# Patient Record
Sex: Male | Born: 1940 | Hispanic: No | Marital: Married | Smoking: Never smoker
Health system: Southern US, Community
[De-identification: ages and names within clinical notes are randomized; demographics above are authoritative.]

## PROBLEM LIST (undated history)

## (undated) DIAGNOSIS — I10 Essential (primary) hypertension: Secondary | ICD-10-CM

## (undated) DIAGNOSIS — N189 Chronic kidney disease, unspecified: Secondary | ICD-10-CM

## (undated) DIAGNOSIS — J45909 Unspecified asthma, uncomplicated: Secondary | ICD-10-CM

## (undated) DIAGNOSIS — F419 Anxiety disorder, unspecified: Secondary | ICD-10-CM

## (undated) DIAGNOSIS — J189 Pneumonia, unspecified organism: Secondary | ICD-10-CM

## (undated) DIAGNOSIS — I499 Cardiac arrhythmia, unspecified: Secondary | ICD-10-CM

## (undated) DIAGNOSIS — C61 Malignant neoplasm of prostate: Secondary | ICD-10-CM

## (undated) DIAGNOSIS — F329 Major depressive disorder, single episode, unspecified: Secondary | ICD-10-CM

## (undated) DIAGNOSIS — M1990 Unspecified osteoarthritis, unspecified site: Secondary | ICD-10-CM

## (undated) DIAGNOSIS — Z923 Personal history of irradiation: Secondary | ICD-10-CM

## (undated) DIAGNOSIS — F32A Depression, unspecified: Secondary | ICD-10-CM

## (undated) DIAGNOSIS — G4730 Sleep apnea, unspecified: Secondary | ICD-10-CM

## (undated) HISTORY — DX: Major depressive disorder, single episode, unspecified: F32.9

## (undated) HISTORY — PX: CORNEAL TRANSPLANT: SHX108

## (undated) HISTORY — DX: Depression, unspecified: F32.A

## (undated) HISTORY — PX: CATARACT EXTRACTION: SUR2

## (undated) HISTORY — DX: Essential (primary) hypertension: I10

---

## 2010-05-30 ENCOUNTER — Ambulatory Visit
Admission: RE | Admit: 2010-05-30 | Discharge: 2010-05-30 | Payer: Self-pay | Source: Home / Self Care | Attending: Internal Medicine | Admitting: Internal Medicine

## 2010-05-30 DIAGNOSIS — F329 Major depressive disorder, single episode, unspecified: Secondary | ICD-10-CM | POA: Insufficient documentation

## 2010-05-30 DIAGNOSIS — I10 Essential (primary) hypertension: Secondary | ICD-10-CM | POA: Insufficient documentation

## 2010-05-30 LAB — CONVERTED CEMR LAB

## 2010-06-05 NOTE — Assessment & Plan Note (Signed)
Summary: NEW MEDICARE PT--#--PKG/OFF--STC   Vital Signs:  Patient profile:   70 year old male Height:      74 inches Weight:      245 pounds BMI:     31.57 O2 Sat:      97 % on Room air Temp:     98.1 degrees F oral Pulse rate:   74 / minute Pulse rhythm:   regular Resp:     16 per minute BP supine:   144 / 84  (left arm) BP sitting:   148 / 84  (right arm)  Nutrition Counseling: Patient's BMI is greater than 25 and therefore counseled on weight management options.  O2 Flow:  Room air  Primary Care Provider:  Etta Grandchild MD   History of Present Illness: New to me treatment of hypertension. He had a complete physical in 2011 by Dr. Dara Hoyer and he tells me that all was fine - no records are available today. He thinks Benicar is too expensive and wants a generic option.  Preventive Screening-Counseling & Management  Alcohol-Tobacco     Alcohol drinks/day: 2     Alcohol type: wine     >5/day in last 3 mos: no     Alcohol Counseling: not indicated; use of alcohol is not excessive or problematic     Feels need to cut down: no     Feels annoyed by complaints: no     Feels guilty re: drinking: no     Needs 'eye opener' in am: no     Smoking Status: never     Tobacco Counseling: not indicated; no tobacco use  Hep-HIV-STD-Contraception     Hepatitis Risk: no risk noted     HIV Risk: no risk noted     STD Risk: no risk noted  Safety-Violence-Falls     Seat Belt Use: yes     Helmet Use: n/a     Firearms in the Home: no firearms in the home     Smoke Detectors: no     Violence in the Home: no risk noted      Sexual History:  currently monogamous.        Drug Use:  never.        Blood Transfusions:  no.    Current Medications (verified): 1)  Losartan Potassium-Hctz 100-12.5 Mg Tabs (Losartan Potassium-Hctz) .... One By Mouth Once Daily For High Blood Pressure 2)  Metoprolol Tartrate 100 Mg Tabs (Metoprolol Tartrate) .... One By Mouth Two Times A Day For High  Blood Pressure 3)  Citalopram Hydrobromide 20 Mg Tabs (Citalopram Hydrobromide) .... One By Mouth Once Daily  Allergies (verified): No Known Drug Allergies  Past History:  Past Medical History: Depression Hypertension  Social History: Smoking Status:  never Hepatitis Risk:  no risk noted HIV Risk:  no risk noted STD Risk:  no risk noted Seat Belt Use:  yes Sexual History:  currently monogamous Drug Use:  never Blood Transfusions:  no  Review of Systems       The patient complains of weight gain.  The patient denies anorexia, fever, weight loss, chest pain, syncope, dyspnea on exertion, peripheral edema, prolonged cough, headaches, hemoptysis, abdominal pain, suspicious skin lesions, difficulty walking, depression, unusual weight change, abnormal bleeding, and enlarged lymph nodes.   Psych:  Denies anxiety, depression, easily angered, easily tearful, irritability, mental problems, panic attacks, sense of great danger, suicidal thoughts/plans, thoughts of violence, and unusual visions or sounds.  Physical Exam  General:  alert, well-developed, well-nourished, well-hydrated, appropriate dress, normal appearance, healthy-appearing, cooperative to examination, good hygiene, and overweight-appearing.   Head:  normocephalic, atraumatic, no abnormalities observed, and no abnormalities palpated.   Eyes:  vision grossly intact and pupils equal.   Mouth:  Oral mucosa and oropharynx without lesions or exudates.  Teeth in good repair. Neck:  supple, full ROM, no masses, no thyromegaly, no thyroid nodules or tenderness, no JVD, normal carotid upstroke, no carotid bruits, no cervical lymphadenopathy, and no neck tenderness.   Lungs:  normal respiratory effort, no intercostal retractions, no accessory muscle use, normal breath sounds, no dullness, no fremitus, no crackles, and no wheezes.   Heart:  normal rate, regular rhythm, no murmur, no gallop, no rub, and no JVD.   Abdomen:  soft,  non-tender, normal bowel sounds, no distention, no masses, no guarding, no rigidity, no rebound tenderness, no abdominal hernia, no inguinal hernia, no hepatomegaly, and no splenomegaly.   Msk:  No deformity or scoliosis noted of thoracic or lumbar spine.   Pulses:  R and L carotid,radial,femoral,dorsalis pedis and posterior tibial pulses are full and equal bilaterally Extremities:  trace left pedal edema and trace right pedal edema.   Neurologic:  No cranial nerve deficits noted. Station and gait are normal. Plantar reflexes are down-going bilaterally. DTRs are symmetrical throughout. Sensory, motor and coordinative functions appear intact. Skin:  turgor normal, color normal, no rashes, no suspicious lesions, no ecchymoses, no petechiae, no purpura, no ulcerations, and no edema.   Cervical Nodes:  no anterior cervical adenopathy and no posterior cervical adenopathy.   Axillary Nodes:  no R axillary adenopathy and no L axillary adenopathy.   Psych:  Oriented X3, memory intact for recent and remote, normally interactive, good eye contact, not anxious appearing, not depressed appearing, not agitated, and not suicidal.     Impression & Recommendations:  Problem # 1:  HYPERTENSION (ICD-401.9) Assessment Improved  His updated medication list for this problem includes:    Losartan Potassium-hctz 100-12.5 Mg Tabs (Losartan potassium-hctz) ..... One by mouth once daily for high blood pressure    Metoprolol Tartrate 100 Mg Tabs (Metoprolol tartrate) ..... One by mouth two times a day for high blood pressure  BP today: 148/84  Problem # 2:  DEPRESSION (ICD-311) Assessment: Improved  His updated medication list for this problem includes:    Citalopram Hydrobromide 20 Mg Tabs (Citalopram hydrobromide) ..... One by mouth once daily  Discussed treatment options, including trial of antidpressant medication. Will refer to behavioral health. Follow-up call in in 24-48 hours and recheck in 2 weeks, sooner  as needed. Patient agrees to call if any worsening of symptoms or thoughts of doing harm arise. Verified that the patient has no suicidal ideation at this time.   Complete Medication List: 1)  Losartan Potassium-hctz 100-12.5 Mg Tabs (Losartan potassium-hctz) .... One by mouth once daily for high blood pressure 2)  Metoprolol Tartrate 100 Mg Tabs (Metoprolol tartrate) .... One by mouth two times a day for high blood pressure 3)  Citalopram Hydrobromide 20 Mg Tabs (Citalopram hydrobromide) .... One by mouth once daily  Other Orders: Tdap => 15yrs IM (04540) Pneumococcal Vaccine (98119) Admin 1st Vaccine (14782) Admin of Any Addtl Vaccine (95621)  Colorectal Screening:  Current Recommendations:    Colonoscopy recommended: patient defers today but will consider in the future  PSA Screening:    Reviewed PSA screening recommendations: patient refuses PSA understanding risk of delayed diagnosis  Immunization & Chemoprophylaxis:    Tetanus vaccine: Tdap  (  05/30/2010)    Pneumovax: Pneumovax  (05/30/2010)  Patient Instructions: 1)  Please schedule a follow-up appointment in 4 months. 2)  It is important that you exercise regularly at least 20 minutes 5 times a week. If you develop chest pain, have severe difficulty breathing, or feel very tired , stop exercising immediately and seek medical attention. 3)  You need to lose weight. Consider a lower calorie diet and regular exercise.  4)  Schedule a colonoscopy/sigmoidoscopy to help detect colon cancer. 5)  Check your Blood Pressure regularly. If it is above 140/90: you should make an appointment. Prescriptions: CITALOPRAM HYDROBROMIDE 20 MG TABS (CITALOPRAM HYDROBROMIDE) One by mouth once daily  #90 x 3   Entered and Authorized by:   Etta Grandchild MD   Signed by:   Etta Grandchild MD on 05/30/2010   Method used:   Electronically to        [facility]. [phone]* (retail)       [phone] [address]       [place], [place]  [address]       Ph: [phone]       Fax: [phone]   RxID:   [id number] METOPROLOL TARTRATE 100 MG TABS (METOPROLOL TARTRATE) One by mouth two times a day for high blood pressure  #180 x 3   Entered and Authorized by:   Etta Grandchild MD   Signed by:   Etta Grandchild MD on 05/30/2010   Method used:   Electronically to        [facility]. [phone]* (retail)       [phone] [address]       [place], [place]  [address]       Ph: [phone]       Fax: [phone]   RxID:   [id number] LOSARTAN POTASSIUM-HCTZ 100-12.5 MG TABS (LOSARTAN POTASSIUM-HCTZ) One by mouth once daily for high blood pressure  #90 x 3   Entered and Authorized by:   Etta Grandchild MD   Signed by:   Etta Grandchild MD on 05/30/2010   Method used:   Electronically to        [facility]. [phone]* (retail)       [phone]       [place], [place]  [address]       Ph: [phone]       Fax: [phone]   RxID:   [id number]    Orders Added: 1)  Tdap => 65yrs IM [90715] 2)  Pneumococcal Vaccine [90732] 3)  Admin 1st Vaccine [90471] 4)  Admin of Any Addtl Vaccine [90472] 5)  New Patient Level II [61443]   Immunizations Administered:  Tetanus Vaccine:    Vaccine Type: Tdap    Site: left deltoid    Mfr: GlaxoSmithKline    Dose: 02/21/2012    Route: IM    Given by: Rock Nephew CMA    Exp. Date: 02/21/2012    Lot #: [id number]    VIS given: 03/21/08 version given May 30, 2010.  Pneumonia Vaccine:    Vaccine Type: Pneumovax    Site: right deltoid    Mfr: Merck    Dose: 0.5 ml    Route: IM    Given by: Rock Nephew CMA    Exp. Date: 09/26/2011    Lot #:  [id number]    VIS given: 04/08/09 version given May 30, 2010.   Immunizations Administered:  Tetanus Vaccine:    Vaccine Type: Tdap    Site: left deltoid    Mfr: GlaxoSmithKline    Dose: 02/21/2012    Route: IM    Given by: Rock Nephew CMA    Exp. Date: 02/21/2012    Lot #: [id number]    VIS given: 03/21/08 version given May 30, 2010.  Pneumonia Vaccine:    Vaccine Type: Pneumovax    Site: right deltoid    Mfr: Merck    Dose: 0.5 ml    Route: IM    Given by: Rock Nephew CMA    Exp. Date: 09/26/2011    Lot #: [id number]    VIS given: 04/08/09 version given May 30, 2010.

## 2011-07-07 ENCOUNTER — Other Ambulatory Visit: Payer: Self-pay | Admitting: Internal Medicine

## 2011-07-08 ENCOUNTER — Other Ambulatory Visit: Payer: Self-pay | Admitting: Internal Medicine

## 2011-07-09 ENCOUNTER — Other Ambulatory Visit: Payer: Self-pay | Admitting: Internal Medicine

## 2011-07-09 MED ORDER — METOPROLOL TARTRATE 100 MG PO TABS: 100.0000 mg | ORAL_TABLET | Freq: Two times a day (BID) | ORAL | Status: DC

## 2011-07-30 ENCOUNTER — Encounter: Payer: Self-pay | Admitting: Internal Medicine

## 2011-07-30 ENCOUNTER — Ambulatory Visit (INDEPENDENT_AMBULATORY_CARE_PROVIDER_SITE_OTHER): Payer: Medicare Other | Admitting: Internal Medicine

## 2011-07-30 ENCOUNTER — Ambulatory Visit (INDEPENDENT_AMBULATORY_CARE_PROVIDER_SITE_OTHER)
Admission: RE | Admit: 2011-07-30 | Discharge: 2011-07-30 | Disposition: A | Discharge status: Home / Self Care | Payer: Medicare Other | Source: Ambulatory Visit | Attending: Internal Medicine | Admitting: Internal Medicine

## 2011-07-30 VITALS — BP 124/68 | HR 82 | Temp 100.0°F | Resp 16 | Wt 240.0 lb

## 2011-07-30 DIAGNOSIS — R05 Cough: Secondary | ICD-10-CM

## 2011-07-30 DIAGNOSIS — J209 Acute bronchitis, unspecified: Secondary | ICD-10-CM

## 2011-07-30 MED ORDER — HYDROCODONE-HOMATROPINE 5-1.5 MG/5ML PO SYRP: 5.0000 mL | ORAL_SOLUTION | Freq: Three times a day (TID) | ORAL | Status: DC | PRN

## 2011-07-30 MED ORDER — AMOXICILLIN-POT CLAVULANATE 875-125 MG PO TABS: 1.0000 | ORAL_TABLET | Freq: Two times a day (BID) | ORAL | Status: AC

## 2011-07-30 NOTE — Assessment & Plan Note (Signed)
I will check a CXR to look for pna, mass, edema, etc. 

## 2011-07-30 NOTE — Patient Instructions (Signed)

## 2011-07-30 NOTE — Assessment & Plan Note (Signed)
Start augmentin and hycodan

## 2011-07-30 NOTE — Progress Notes (Signed)
Subjective:    Patient ID: Daniel Howe, male    DOB: [birth year], 71 y.o.   MRN: [id number]  Cough This is a new problem. The current episode started in the past 7 days. The problem has been gradually worsening. The problem occurs every few hours. The cough is productive of purulent sputum. Associated symptoms include chills, a sore throat and sweats. Pertinent negatives include no chest pain, ear congestion, ear pain, fever, headaches, heartburn, hemoptysis, myalgias, nasal congestion, postnasal drip, rash, rhinorrhea, shortness of breath, weight loss or wheezing. The symptoms are aggravated by nothing. He has tried nothing for the symptoms.      Review of Systems  Constitutional: Positive for chills. Negative for fever, weight loss, diaphoresis, activity change, appetite change, fatigue and unexpected weight change.  HENT: Positive for sore throat. Negative for hearing loss, ear pain, nosebleeds, congestion, facial swelling, rhinorrhea, sneezing, drooling, mouth sores, trouble swallowing, neck pain, neck stiffness, dental problem, voice change, postnasal drip, sinus pressure, tinnitus and ear discharge.   Eyes: Negative.   Respiratory: Positive for cough. Negative for apnea, hemoptysis, choking, chest tightness, shortness of breath, wheezing and stridor.   Cardiovascular: Negative for chest pain, palpitations and leg swelling.  Gastrointestinal: Negative for heartburn, nausea, vomiting, abdominal pain, diarrhea, constipation, blood in stool and abdominal distention.  Genitourinary: Negative.   Musculoskeletal: Negative for myalgias, back pain, joint swelling, arthralgias and gait problem.  Skin: Negative for color change, pallor, rash and wound.  Neurological: Negative.  Negative for headaches.  Hematological: Negative for adenopathy. Does not bruise/bleed easily.  Psychiatric/Behavioral: Negative.        Objective:   Physical Exam  Vitals reviewed. Constitutional: He is oriented to  person, place, and time. He appears well-developed and well-nourished. No distress.  HENT:  Head: Normocephalic and atraumatic. No trismus in the jaw.  Right Ear: Hearing, tympanic membrane, external ear and ear canal normal.  Left Ear: Hearing, tympanic membrane, external ear and ear canal normal.  Nose: No mucosal edema or rhinorrhea. Right sinus exhibits no maxillary sinus tenderness and no frontal sinus tenderness. Left sinus exhibits no maxillary sinus tenderness and no frontal sinus tenderness.  Mouth/Throat: Oropharynx is clear and moist and mucous membranes are normal. Mucous membranes are not pale, not dry and not cyanotic. No uvula swelling. No oropharyngeal exudate, posterior oropharyngeal edema, posterior oropharyngeal erythema or tonsillar abscesses.  Eyes: Conjunctivae are normal. Right eye exhibits no discharge. Left eye exhibits no discharge. No scleral icterus.  Neck: Normal range of motion. Neck supple. No JVD present. No tracheal deviation present. No thyromegaly present.  Cardiovascular: Normal rate, regular rhythm, normal heart sounds and intact distal pulses.  Exam reveals no gallop and no friction rub.   No murmur heard. Pulmonary/Chest: Effort normal and breath sounds normal. No stridor. No respiratory distress. He has no wheezes. He has no rales. He exhibits no tenderness.  Abdominal: Soft. Bowel sounds are normal. He exhibits no distension and no mass. There is no tenderness. There is no rebound and no guarding.  Musculoskeletal: Normal range of motion. He exhibits no edema and no tenderness.  Lymphadenopathy:    He has no cervical adenopathy.  Neurological: He is oriented to person, place, and time.  Skin: Skin is warm and dry. No rash noted. He is not diaphoretic. No erythema. No pallor.  Psychiatric: He has a normal mood and affect. His behavior is normal. Judgment and thought content normal.          Assessment & Plan:

## 2011-08-22 ENCOUNTER — Other Ambulatory Visit: Payer: Self-pay | Admitting: Internal Medicine

## 2011-10-12 ENCOUNTER — Ambulatory Visit (INDEPENDENT_AMBULATORY_CARE_PROVIDER_SITE_OTHER): Payer: Medicare Other | Admitting: Internal Medicine

## 2011-10-12 ENCOUNTER — Encounter: Payer: Self-pay | Admitting: Internal Medicine

## 2011-10-12 VITALS — BP 134/84 | HR 71 | Temp 98.2°F | Resp 16 | Wt 244.0 lb

## 2011-10-12 DIAGNOSIS — E7800 Pure hypercholesterolemia, unspecified: Secondary | ICD-10-CM

## 2011-10-12 DIAGNOSIS — I10 Essential (primary) hypertension: Secondary | ICD-10-CM

## 2011-10-12 DIAGNOSIS — K921 Melena: Secondary | ICD-10-CM | POA: Insufficient documentation

## 2011-10-12 DIAGNOSIS — Z0000 Encounter for general adult medical examination without abnormal findings: Secondary | ICD-10-CM

## 2011-10-12 DIAGNOSIS — N400 Enlarged prostate without lower urinary tract symptoms: Secondary | ICD-10-CM

## 2011-10-12 NOTE — Assessment & Plan Note (Signed)
psa today

## 2011-10-12 NOTE — Assessment & Plan Note (Signed)
Check cbc and GI referral

## 2011-10-12 NOTE — Progress Notes (Signed)
  Subjective:    Patient ID: Daniel Howe, male    DOB: 24-May-1940, 71 y.o.   MRN: [id number]  HPI He returns for a complete physical and he offers no complaints.   Review of Systems  Constitutional: Negative for fever, chills, diaphoresis, activity change, appetite change, fatigue and unexpected weight change.  HENT: Negative.   Eyes: Negative.   Respiratory: Negative for apnea, cough, choking, chest tightness, shortness of breath, wheezing and stridor.   Cardiovascular: Negative for chest pain, palpitations and leg swelling.  Gastrointestinal: Positive for anal bleeding (remotely and intermittently). Negative for nausea, vomiting, abdominal pain, diarrhea, constipation, blood in stool, abdominal distention and rectal pain.  Genitourinary: Negative for dysuria, urgency, frequency, hematuria, flank pain, decreased urine volume, discharge, penile swelling, scrotal swelling, enuresis, difficulty urinating, genital sores, penile pain and testicular pain.  Musculoskeletal: Negative for myalgias, back pain, joint swelling, arthralgias and gait problem.  Skin: Negative for color change, pallor, rash and wound.  Neurological: Negative.   Hematological: Negative for adenopathy. Does not bruise/bleed easily.  Psychiatric/Behavioral: Negative for suicidal ideas, hallucinations, behavioral problems, confusion, sleep disturbance, self-injury, dysphoric mood, decreased concentration and agitation. The patient is not nervous/anxious and is not hyperactive.        Objective:   Physical Exam  Vitals reviewed. Constitutional: He is oriented to person, place, and time. He appears well-developed and well-nourished. No distress.  HENT:  Head: Normocephalic and atraumatic.  Mouth/Throat: Oropharynx is clear and moist. No oropharyngeal exudate.  Eyes: Conjunctivae are normal. Right eye exhibits no discharge. Left eye exhibits no discharge. No scleral icterus.  Neck: Normal range of motion. Neck supple. No  JVD present. No tracheal deviation present. No thyromegaly present.  Cardiovascular: Normal rate, regular rhythm, normal heart sounds and intact distal pulses.  Exam reveals no gallop and no friction rub.   No murmur heard. Pulmonary/Chest: Effort normal and breath sounds normal. No stridor. No respiratory distress. He has no wheezes. He has no rales. He exhibits no tenderness.  Abdominal: Soft. Bowel sounds are normal. He exhibits no distension and no mass. There is no tenderness. There is no rebound and no guarding. A hernia is present. Hernia confirmed positive in the left inguinal area (large left direct inguinal hernia). Hernia confirmed negative in the right inguinal area.  Genitourinary: Prostate normal and penis normal. Rectal exam shows internal hemorrhoid. Rectal exam shows no external hemorrhoid, no fissure, no mass, no tenderness and anal tone normal. Guaiac positive stool. Prostate is not enlarged and not tender. Right testis shows no mass, no swelling and no tenderness. Right testis is descended. Left testis shows no mass, no swelling and no tenderness. Left testis is descended. Circumcised. No penile tenderness. No discharge found.  Musculoskeletal: Normal range of motion. He exhibits no edema and no tenderness.  Lymphadenopathy:    He has no cervical adenopathy.       Right: No inguinal adenopathy present.       Left: No inguinal adenopathy present.  Neurological: He is oriented to person, place, and time.  Skin: Skin is warm and dry. No rash noted. He is not diaphoretic. No erythema. No pallor.  Psychiatric: He has a normal mood and affect. His behavior is normal. Judgment and thought content normal.          Assessment & Plan:

## 2011-10-12 NOTE — Assessment & Plan Note (Signed)
flp tsh and cmp today

## 2011-10-12 NOTE — Patient Instructions (Signed)
Health Maintenance, Males A healthy lifestyle and preventative care can promote health and wellness.  Maintain regular health, dental, and eye exams.   Eat a healthy diet. Foods like vegetables, fruits, whole grains, low-fat dairy products, and lean protein foods contain the nutrients you need without too many calories. Decrease your intake of foods high in solid fats, added sugars, and salt. Get information about a proper diet from your caregiver, if necessary.   Regular physical exercise is one of the most important things you can do for your health. Most adults should get at least 150 minutes of moderate-intensity exercise (any activity that increases your heart rate and causes you to sweat) each week. In addition, most adults need muscle-strengthening exercises on 2 or more days a week.    Maintain a healthy weight. The body mass index (BMI) is a screening tool to identify possible weight problems. It provides an estimate of body fat based on height and weight. Your caregiver can help determine your BMI, and can help you achieve or maintain a healthy weight. For adults 20 years and older:   A BMI below 18.5 is considered underweight.   A BMI of 18.5 to 24.9 is normal.   A BMI of 25 to 29.9 is considered overweight.   A BMI of 30 and above is considered obese.   Maintain normal blood lipids and cholesterol by exercising and minimizing your intake of saturated fat. Eat a balanced diet with plenty of fruits and vegetables. Blood tests for lipids and cholesterol should begin at age 20 and be repeated every 5 years. If your lipid or cholesterol levels are high, you are over 50, or you are a high risk for heart disease, you may need your cholesterol levels checked more frequently.Ongoing high lipid and cholesterol levels should be treated with medicines, if diet and exercise are not effective.   If you smoke, find out from your caregiver how to quit. If you do not use tobacco, do not start.    If you choose to drink alcohol, do not exceed 2 drinks per day. One drink is considered to be 12 ounces (355 mL) of beer, 5 ounces (148 mL) of wine, or 1.5 ounces (44 mL) of liquor.   Avoid use of street drugs. Do not share needles with anyone. Ask for help if you need support or instructions about stopping the use of drugs.   High blood pressure causes heart disease and increases the risk of stroke. Blood pressure should be checked at least every 1 to 2 years. Ongoing high blood pressure should be treated with medicines if weight loss and exercise are not effective.   If you are 45 to 69 years old, ask your caregiver if you should take aspirin to prevent heart disease.   Diabetes screening involves taking a blood sample to check your fasting blood sugar level. This should be done once every 3 years, after age 45, if you are within normal weight and without risk factors for diabetes. Testing should be considered at a younger age or be carried out more frequently if you are overweight and have at least 1 risk factor for diabetes.   Colorectal cancer can be detected and often prevented. Most routine colorectal cancer screening begins at the age of 50 and continues through age 75. However, your caregiver may recommend screening at an earlier age if you have risk factors for colon cancer. On a yearly basis, your caregiver may provide home test kits to check for hidden   blood in the stool. Use of a small camera at the end of a tube, to directly examine the colon (sigmoidoscopy or colonoscopy), can detect the earliest forms of colorectal cancer. Talk to your caregiver about this at age 50, when routine screening begins. Direct examination of the colon should be repeated every 5 to 10 years through age 75, unless early forms of pre-cancerous polyps or small growths are found.   Hepatitis C blood testing is recommended for all people born from 1945 through 1965 and any individual with known risks for  hepatitis C.   Healthy men should no longer receive prostate-specific antigen (PSA) blood tests as part of routine cancer screening. Consult with your caregiver about prostate cancer screening.   Testicular cancer screening is not recommended for adolescents or adult males who have no symptoms. Screening includes self-exam, caregiver exam, and other screening tests. Consult with your caregiver about any symptoms you have or any concerns you have about testicular cancer.   Practice safe sex. Use condoms and avoid high-risk sexual practices to reduce the spread of sexually transmitted infections (STIs).   Use sunscreen with a sun protection factor (SPF) of 30 or greater. Apply sunscreen liberally and repeatedly throughout the day. You should seek shade when your shadow is shorter than you. Protect yourself by wearing long sleeves, pants, a wide-brimmed hat, and sunglasses year round, whenever you are outdoors.   Notify your caregiver of new moles or changes in moles, especially if there is a change in shape or color. Also notify your caregiver if a mole is larger than the size of a pencil eraser.   A one-time screening for abdominal aortic aneurysm (AAA) and surgical repair of large AAAs by sound wave imaging (ultrasonography) is recommended for ages 65 to 75 years who are current or former smokers.   Stay current with your immunizations.  Document Released: 10/17/2007 Document Revised: 04/09/2011 Document Reviewed: 09/15/2010 [employer] Patient Information 2012 [employer]. 

## 2011-10-12 NOTE — Assessment & Plan Note (Signed)
His EKG shows an increase in the pr interval consistent with his beta-blocker therapy, otherwise it is normal, his BP is well controlled today and he does not have any worrisome symptoms.

## 2011-10-12 NOTE — Assessment & Plan Note (Addendum)
He has left inguinal hernia that does not bother him and he does not anything done about that. He has blood in his stool and he has never had a colonoscopy done so I made that referral. By my exam today this could be hemorrhoidal bleeding but I think he needs a scope done to look for other causes of bloody stool.  The patient is here for annual Medicare wellness examination and management of other chronic and acute problems.   The risk factors are reflected in the social history.  The roster of all physicians providing medical care to patient - is listed in the Snapshot section of the chart.  Activities of daily living:  The patient is 100% inedpendent in all ADLs: dressing, toileting, feeding as well as independent mobility  Home safety : The patient has smoke detectors in the home. They wear seatbelts.No firearms at home ( firearms are present in the home, kept in a safe fashion). There is no violence in the home.   There is no risks for hepatitis, STDs or HIV. There is no   history of blood transfusion. They have no travel history to infectious disease endemic areas of the world.  The patient has (has not) seen their dentist in the last six month. They have (not) seen their eye doctor in the last year. They deny (admit to) any hearing difficulty and have not had audiologic testing in the last year.  They do not  have excessive sun exposure. Discussed the need for sun protection: hats, long sleeves and use of sunscreen if there is significant sun exposure.   Diet: the importance of a healthy diet is discussed. They do have a healthy (unhealthy-high fat/fast food) diet.  The patient has a regular exercise program.  Depression screen: there are no signs or vegative symptoms of depression- irritability, change in appetite, anhedonia, sadness/tearfullness.  Cognitive assessment: the patient manages all their financial and personal affairs and is actively engaged. They could relate day,date,year  and events; recalled 3/3 objects at 3 minutes; performed clock-face test normally.  The following portions of the patient's history were reviewed and updated as appropriate: allergies, current medications, past family history, past medical history,  past surgical history, past social history  and problem list.  Vision, hearing, body mass index were assessed and reviewed.   During the course of the visit the patient was educated and counseled about appropriate screening and preventive services including : fall prevention , diabetes screening, nutrition counseling, colorectal cancer screening, and recommended immunizations.

## 2011-10-13 ENCOUNTER — Encounter: Payer: Self-pay | Admitting: Internal Medicine

## 2011-10-13 ENCOUNTER — Encounter: Payer: Self-pay | Admitting: Gastroenterology

## 2011-10-13 ENCOUNTER — Other Ambulatory Visit (INDEPENDENT_AMBULATORY_CARE_PROVIDER_SITE_OTHER): Payer: Medicare Other

## 2011-10-13 DIAGNOSIS — N400 Enlarged prostate without lower urinary tract symptoms: Secondary | ICD-10-CM

## 2011-10-13 DIAGNOSIS — Z125 Encounter for screening for malignant neoplasm of prostate: Secondary | ICD-10-CM

## 2011-10-13 DIAGNOSIS — E7800 Pure hypercholesterolemia, unspecified: Secondary | ICD-10-CM

## 2011-10-13 DIAGNOSIS — I10 Essential (primary) hypertension: Secondary | ICD-10-CM

## 2011-10-13 LAB — CBC WITH DIFFERENTIAL/PLATELET
Basophils Absolute: 0 10*3/uL (ref 0.0–0.1)
Eosinophils Relative: 1.9 % (ref 0.0–5.0)
HCT: 39.9 % (ref 39.0–52.0)
Lymphs Abs: 1.5 10*3/uL (ref 0.7–4.0)
Monocytes Absolute: 1.1 10*3/uL — ABNORMAL HIGH (ref 0.1–1.0)
Monocytes Relative: 16.4 % — ABNORMAL HIGH (ref 3.0–12.0)
Neutrophils Relative %: 57.8 % (ref 43.0–77.0)
Platelets: 221 10*3/uL (ref 150.0–400.0)
RDW: 13.1 % (ref 11.5–14.6)
WBC: 6.6 10*3/uL (ref 4.5–10.5)

## 2011-10-13 LAB — URINALYSIS, ROUTINE W REFLEX MICROSCOPIC
Bilirubin Urine: NEGATIVE
Ketones, ur: NEGATIVE
Specific Gravity, Urine: 1.025 (ref 1.000–1.030)
Urine Glucose: NEGATIVE
pH: 6 (ref 5.0–8.0)

## 2011-10-13 LAB — LDL CHOLESTEROL, DIRECT: Direct LDL: 59.9 mg/dL

## 2011-10-13 LAB — COMPREHENSIVE METABOLIC PANEL
ALT: 37 U/L (ref 0–53)
Albumin: 3.8 g/dL (ref 3.5–5.2)
CO2: 25 mEq/L (ref 19–32)
Chloride: 108 mEq/L (ref 96–112)
GFR: 48.25 mL/min — ABNORMAL LOW (ref >60.00)
Glucose, Bld: 107 mg/dL — ABNORMAL HIGH (ref 70–99)
Potassium: 5 mEq/L (ref 3.5–5.1)
Sodium: 140 mEq/L (ref 135–145)
Total Protein: 6.7 g/dL (ref 6.0–8.3)

## 2011-10-13 LAB — LIPID PANEL
Cholesterol: 178 mg/dL (ref 0–200)
Total CHOL/HDL Ratio: 5
Triglycerides: 371 mg/dL — ABNORMAL HIGH (ref 0.0–149.0)
VLDL: 74.2 mg/dL — ABNORMAL HIGH (ref 0.0–40.0)

## 2011-10-13 LAB — TSH: TSH: 3.17 u[IU]/mL (ref 0.35–5.50)

## 2011-11-10 ENCOUNTER — Ambulatory Visit (INDEPENDENT_AMBULATORY_CARE_PROVIDER_SITE_OTHER): Payer: Medicare Other | Admitting: Gastroenterology

## 2011-11-10 ENCOUNTER — Encounter: Payer: Self-pay | Admitting: Gastroenterology

## 2011-11-10 VITALS — BP 160/86 | HR 84 | Ht 74.0 in | Wt 235.2 lb

## 2011-11-10 DIAGNOSIS — R195 Other fecal abnormalities: Secondary | ICD-10-CM

## 2011-11-10 DIAGNOSIS — K625 Hemorrhage of anus and rectum: Secondary | ICD-10-CM

## 2011-11-10 MED ORDER — MOVIPREP 100 G PO SOLR: 1.0000 | ORAL | Status: DC

## 2011-11-10 NOTE — Patient Instructions (Addendum)
You will be set up for a colonoscopy with MAC sedation for heme positive stool (LEC)

## 2011-11-10 NOTE — Progress Notes (Signed)
HPI: This is a  very pleasant 71 year old man whom I am meeting for the first time today.  He has seen minor rectal bleeding.  He knows he has hemorrhoids.  Would generally take a long time in bathroom, reading.  Not because he has been constipated but more of a habit.  Stopped that 6 months ago  He recently met with his primary care physician. He explained some intermittent rectal bleeding and was Hemoccult-positive on examination. Blood work last month shows that he is not anemic.  No colon disease in family.  Overall weight down, intintionally losing weight.  Pressure from wife and PCP.  He is very anxious, nervous about the idea of undergoing a colonoscopy.   Review of systems: Pertinent positive and negative review of systems were noted in the above HPI section. Complete review of systems was performed and was otherwise normal.    Past Medical History  Diagnosis Date  . Depression   . Hypertension     Past Surgical History  Procedure Date  . Cataract extraction   . Corneal transplant     Current Outpatient Prescriptions  Medication Sig Dispense Refill  . aspirin 81 MG tablet Take 81 mg by mouth daily.      . citalopram (CELEXA) 20 MG tablet take 1 tablet by mouth once daily  90 tablet  3  . losartan-hydrochlorothiazide (HYZAAR) 100-12.5 MG per tablet take 1 tablet by mouth once daily for HIGH BLOOD PRESSURE  90 tablet  3  . metoprolol (LOPRESSOR) 100 MG tablet Take 1 tablet (100 mg total) by mouth 2 (two) times daily.  180 tablet  3    Allergies as of 11/10/2011  . (No Known Allergies)    Family History  Problem Relation Age of Onset  . Cancer Neg Hx   . Early death Neg Hx   . Hyperlipidemia Neg Hx   . Hypertension Neg Hx   . Diabetes Brother   . Heart disease Brother     History   Social History  . Marital Status: Married    Spouse Name: N/A    Number of Children: 2  . Years of Education: N/A   Occupational History  . Retired     Airline pilot    Social History Main Topics  . Smoking status: Never Smoker   . Smokeless tobacco: Never Used  . Alcohol Use: 8.4 oz/week    14 Glasses of wine per week  . Drug Use: No  . Sexually Active: Yes   Other Topics Concern  . Not on file   Social History Narrative  . No narrative on file       Physical Exam: BP 160/86  Pulse 84  Ht 6\' 2"  (1.88 m)  Wt 235 lb 3.2 oz (106.686 kg)  BMI 30.20 kg/m2 Constitutional: generally well-appearing Psychiatric: alert and oriented x3 Eyes: extraocular movements intact Mouth: oral pharynx moist, no lesions Neck: supple no lymphadenopathy Cardiovascular: heart regular rate and rhythm Lungs: clear to auscultation bilaterally Abdomen: soft, nontender, nondistended, no obvious ascites, no peritoneal signs, normal bowel sounds Extremities: no lower extremity edema bilaterally Skin: no lesions on visible extremities    Assessment and plan: 71 y.o. male with  Hemoccult-positive stool, intermittent rectal bleeding  He is very nervous, anxious about the idea of undergoing a colonoscopy. We spent a good 5-10 minutes discussing his concerns, the risks and potential benefits to colonoscopy, alternatives tests. I think allayed most of his concerns. He wishes to proceed. I think  it would be best if this be done with propofol  sedation given his high anxiety level.

## 2011-12-25 ENCOUNTER — Encounter: Payer: Self-pay | Admitting: Gastroenterology

## 2011-12-25 ENCOUNTER — Other Ambulatory Visit: Payer: Self-pay

## 2011-12-25 ENCOUNTER — Ambulatory Visit (AMBULATORY_SURGERY_CENTER): Payer: Medicare Other | Admitting: Gastroenterology

## 2011-12-25 ENCOUNTER — Telehealth: Payer: Self-pay

## 2011-12-25 VITALS — BP 149/88 | HR 67 | Resp 20 | Ht 74.0 in | Wt 235.0 lb

## 2011-12-25 DIAGNOSIS — K469 Unspecified abdominal hernia without obstruction or gangrene: Secondary | ICD-10-CM

## 2011-12-25 DIAGNOSIS — K4090 Unilateral inguinal hernia, without obstruction or gangrene, not specified as recurrent: Secondary | ICD-10-CM

## 2011-12-25 DIAGNOSIS — K625 Hemorrhage of anus and rectum: Secondary | ICD-10-CM

## 2011-12-25 DIAGNOSIS — R195 Other fecal abnormalities: Secondary | ICD-10-CM

## 2011-12-25 LAB — HM COLONOSCOPY

## 2011-12-25 MED ORDER — SODIUM CHLORIDE 0.9 % IV SOLN: 500.0000 mL | INTRAVENOUS | Status: DC

## 2011-12-25 NOTE — Progress Notes (Signed)
Procedure incomplete  Propofol given per Rosemarie Beath CRNA

## 2011-12-25 NOTE — Telephone Encounter (Signed)
Message copied by Donata Duff on Fri Dec 25, 2011  3:04 PM ------      Message from: Marnette Burgess      Created: Fri Dec 25, 2011  2:55 PM       Patient scheduled to see Dr. Luretha Murphy on 01/01/12 @ 9:20am, arrive @ 8:50am.   If you have any questions please call [phone].            Thank You,      Marnette Burgess      ----- Message -----         From: Donata Duff, CMA         Sent: 12/25/2011   2:25 PM           To: Marnette Burgess            general surgeon to consider elective      repair of left inguinal hernia.

## 2011-12-25 NOTE — Patient Instructions (Addendum)
Discharge instructions given with verbal understanding. Incomplete exam. Resume previous medications. YOU HAD AN ENDOSCOPIC PROCEDURE TODAY AT THE [facility]: Refer to the procedure report that was given to you for any specific questions about what was found during the examination.  If the procedure report does not answer your questions, please call your gastroenterologist to clarify.  If you requested that your care partner not be given the details of your procedure findings, then the procedure report has been included in a sealed envelope for you to review at your convenience later.  YOU SHOULD EXPECT: Some feelings of bloating in the abdomen. Passage of more gas than usual.  Walking can help get rid of the air that was put into your GI tract during the procedure and reduce the bloating. If you had a lower endoscopy (such as a colonoscopy or flexible sigmoidoscopy) you may notice spotting of blood in your stool or on the toilet paper. If you underwent a bowel prep for your procedure, then you may not have a normal bowel movement for a few days.  DIET: Your first meal following the procedure should be a light meal and then it is ok to progress to your normal diet.  A half-sandwich or bowl of soup is an example of a good first meal.  Heavy or fried foods are harder to digest and may make you feel nauseous or bloated.  Likewise meals heavy in dairy and vegetables can cause extra gas to form and this can also increase the bloating.  Drink plenty of fluids but you should avoid alcoholic beverages for 24 hours.  ACTIVITY: Your care partner should take you home directly after the procedure.  You should plan to take it easy, moving slowly for the rest of the day.  You can resume normal activity the day after the procedure however you should NOT DRIVE or use heavy machinery for 24 hours (because of the sedation medicines used during the test).    SYMPTOMS TO REPORT IMMEDIATELY: A  gastroenterologist can be reached at any hour.  During normal business hours, 8:30 AM to 5:00 PM Monday through Friday, call [phone].  After hours and on weekends, please call the GI answering service at [phone] who will take a message and have the physician on call contact you.   Following lower endoscopy (colonoscopy or flexible sigmoidoscopy):  Excessive amounts of blood in the stool  Significant tenderness or worsening of abdominal pains  Swelling of the abdomen that is new, acute  Fever of 100F or higher  FOLLOW UP: If any biopsies were taken you will be contacted by phone or by letter within the next 1-3 weeks.  Call your gastroenterologist if you have not heard about the biopsies in 3 weeks.  Our staff will call the home number listed on your records the next business day following your procedure to check on you and address any questions or concerns that you may have at that time regarding the information given to you following your procedure. This is a courtesy call and so if there is no answer at the home number and we have not heard from you through the emergency physician on call, we will assume that you have returned to your regular daily activities without incident.  SIGNATURES/CONFIDENTIALITY: You and/or your care partner have signed paperwork which will be entered into your electronic medical record.  These signatures attest to the fact that that the information above on your After Visit Summary has been reviewed  and is understood.  Full responsibility of the confidentiality of this discharge information lies with you and/or your care-partner.

## 2011-12-25 NOTE — Progress Notes (Signed)
Patient did not experience any of the following events: a burn prior to discharge; a fall within the facility; wrong site/side/patient/procedure/implant event; or a hospital transfer or hospital admission upon discharge from the facility. (G8907) Patient did not have preoperative order for IV antibiotic SSI prophylaxis. (G8918)  

## 2011-12-25 NOTE — Op Note (Signed)
[facility] [address] [place], [address]   COLONOSCOPY PROCEDURE REPORT  PATIENT: Daniel Howe, Daniel Howe  MR#: [id number] BIRTHDATE: 10/26/1940 , 70  yrs. old GENDER: Male ENDOSCOPIST: Rachael Fee, MD REFERRED XB:JYNWGN Karsten Ro, M.D. PROCEDURE DATE:  12/25/2011 PROCEDURE:   Colonoscopy, diagnostic ASA CLASS:   Class III INDICATIONS:minor rectal bleeding. MEDICATIONS: MAC sedation, administered by CRNA and propofol (Diprivan) 300mg  IV  DESCRIPTION OF PROCEDURE:   After the risks benefits and alternatives of the procedure were thoroughly explained, informed consent was obtained.  A digital rectal exam revealed no abnormalities of the rectum.   The LB [id number]-H180AL [id number]  endoscope was introduced through the anus and advanced to the mid transverse colon .Limited by coiling of scope in left inguinal hernia.   No adverse events experienced.   The quality of the prep was good. The instrument was then slowly withdrawn as the colon was fully examined.      COLON FINDINGS: Incomplete colonoscopy; despite trying adult and pediatric scopes, postion changes, the colonoscoy continually coiled in scrotum through known left inguinal hernia.  The colon mucosa was otherwise normal.  Retroflexed views revealed no abnormalities. [      The scope was withdrawn and the procedure completed.  COMPLICATIONS: There were no complications.  ENDOSCOPIC IMPRESSION: Incomplete examination due to left sided inguinal hernia The colon mucosa was otherwise normal  RECOMMENDATIONS: My office will refer you to general surgeon to consider elective repair of left inguinal hernia. After that has been repaired, will repeat colonoscopy attempt.   eSigned:  Rachael Fee, MD 12/25/2011 9:05 AM revised

## 2011-12-28 ENCOUNTER — Telehealth: Payer: Self-pay

## 2011-12-28 NOTE — Telephone Encounter (Signed)
Left message on answering machine. 

## 2011-12-28 NOTE — Telephone Encounter (Signed)
Blanca to notify pt  

## 2012-01-01 ENCOUNTER — Ambulatory Visit (INDEPENDENT_AMBULATORY_CARE_PROVIDER_SITE_OTHER): Payer: Medicare Other | Admitting: Surgery

## 2012-01-22 ENCOUNTER — Encounter (INDEPENDENT_AMBULATORY_CARE_PROVIDER_SITE_OTHER): Payer: Self-pay | Admitting: Surgery

## 2012-01-22 ENCOUNTER — Ambulatory Visit (INDEPENDENT_AMBULATORY_CARE_PROVIDER_SITE_OTHER): Payer: Medicare Other | Admitting: Surgery

## 2012-01-22 VITALS — BP 142/88 | HR 62 | Temp 98.0°F | Resp 18 | Ht 74.0 in | Wt 235.0 lb

## 2012-01-22 DIAGNOSIS — K4090 Unilateral inguinal hernia, without obstruction or gangrene, not specified as recurrent: Secondary | ICD-10-CM | POA: Insufficient documentation

## 2012-01-22 NOTE — Progress Notes (Signed)
Chief Complaint:  Left inguinal scrotal hernia  History of Present Illness:  Daniel Howe is an 71 y.o. male who has had a left inguinal hernia for some time.  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  That he tried to do a colonoscopy on him but could not negotiate the colon that was obviously involved in this large hernia. I would therefore suspect that this is a large sliding hernia on the left side containing the sigmoid colon.  I discussed repair including complications not limited to recurrence, nerve pain, numbness, or testicular infarction. I mentioned that this would not impair his nerves to his sexual function.  He wants to go and get this scheduled. We'll do this under general anesthesia probably Cond surgery.  Past Medical History  Diagnosis Date  . Depression   . Hypertension     Past Surgical History  Procedure Date  . Cataract extraction   . Corneal transplant     Current Outpatient Prescriptions  Medication Sig Dispense Refill  . aspirin 81 MG tablet Take 81 mg by mouth daily.      . citalopram (CELEXA) 20 MG tablet take 1 tablet by mouth once daily  90 tablet  3  . losartan-hydrochlorothiazide (HYZAAR) 100-12.5 MG per tablet take 1 tablet by mouth once daily for HIGH BLOOD PRESSURE  90 tablet  3  . metoprolol (LOPRESSOR) 100 MG tablet Take 1 tablet (100 mg total) by mouth 2 (two) times daily.  180 tablet  3   Review of patient's allergies indicates no known allergies. Family History  Problem Relation Age of Onset  . Cancer Neg Hx   . Early death Neg Hx   . Hyperlipidemia Neg Hx   . Hypertension Neg Hx   . Diabetes Brother   . Heart disease Brother    Social History:   reports that he has never smoked. He has never used smokeless tobacco. He reports that he drinks about 8.4 ounces of alcohol per week. He reports that he does not use illicit drugs.   REVIEW OF SYSTEMS - PERTINENT POSITIVES ONLY: No history of DVT  Physical Exam:   Blood pressure  142/88, pulse 62, temperature 98 F (36.7 C), temperature source Temporal, resp. rate 18, height 6\' 2"  (1.88 m), weight 235 lb (106.595 kg), SpO2 100.00%. Body mass index is 30.17 kg/(m^2).  Gen:  WDWN WM NAD  Neurological: Alert and oriented to person, place, and time. Motor and sensory function is grossly intact  Head: Normocephalic and atraumatic.  Eyes: Conjunctivae are normal. Pupils are equal, round, and reactive to light. No scleral icterus.  Neck: Normal range of motion. Neck supple. No tracheal deviation or thyromegaly present.  Cardiovascular:  SR without murmurs or gallops.  No carotid bruits Respiratory: Effort normal.  No respiratory distress. No chest wall tenderness. Breath sounds normal.  No wheezes, rales or rhonchi.  Abdomen:  Large left inguinal hernia that he is able to reduce GU: Musculoskeletal: Normal range of motion. Extremities are nontender. No cyanosis, edema or clubbing noted Lymphadenopathy: No cervical, preauricular, postauricular or axillary adenopathy is present Skin: Skin is warm and dry. No rash noted. No diaphoresis. No erythema. No pallor. Pscyh: Normal mood and affect. Behavior is normal. Judgment and thought content normal.   LABORATORY RESULTS: No results found for this or any previous visit (from the past 48 hour(s)).  RADIOLOGY RESULTS: No results found.  Problem List: Patient Active Problem List  Diagnosis  . DEPRESSION  . HYPERTENSION  .  Routine general medical examination at a health care facility  . Pure hypercholesterolemia  . BPH (benign prostatic hyperplasia)  . 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 Daphine Deutscher, MD, [facility] [phone] beeper [phone]  01/22/2012 1:08 PM

## 2012-01-22 NOTE — Patient Instructions (Signed)
Inguinal Hernia, Adult  Muscles help keep everything in the body in its proper place. But if a weak spot in the muscles develops, something can poke through. That is called a hernia. When this happens in the lower part of the belly (abdomen), it is called an inguinal hernia. (It takes its name from a part of the body in this region called the inguinal canal.) A weak spot in the wall of muscles lets some fat or part of the small intestine bulge through. An inguinal hernia can develop at any age. Men get them more often than women.  CAUSES   In adults, an inguinal hernia develops over time.  · It can be triggered by:  · Suddenly straining the muscles of the lower abdomen.  · Lifting heavy objects.  · Straining to have a bowel movement. Difficult bowel movements (constipation) can lead to this.  · Constant coughing. This may be caused by smoking or lung disease.  · Being overweight.  · Being pregnant.  · Working at a job that requires long periods of standing or heavy lifting.  · Having had an inguinal hernia before.  One type can be an emergency situation. It is called a strangulated inguinal hernia. It develops if part of the small intestine slips through the weak spot and cannot get back into the abdomen. The blood supply can be cut off. If that happens, part of the intestine may die. This situation requires emergency surgery.  SYMPTOMS   Often, a small inguinal hernia has no symptoms. It is found when a healthcare provider does a physical exam. Larger hernias usually have symptoms.   · In adults, symptoms may include:  · A lump in the groin. This is easier to see when the person is standing. It might disappear when lying down.  · In men, a lump in the scrotum.  · Pain or burning in the groin. This occurs especially when lifting, straining or coughing.  · A dull ache or feeling of pressure in the groin.  · Signs of a strangulated hernia can include:  · A bulge in the groin that becomes very painful and tender to the  touch.  · A bulge that turns red or purple.  · Fever, nausea and vomiting.  · Inability to have a bowel movement or to pass gas.  DIAGNOSIS   To decide if you have an inguinal hernia, a healthcare provider will probably do a physical examination.  · This will include asking questions about any symptoms you have noticed.  · The healthcare provider might feel the groin area and ask you to cough. If an inguinal hernia is felt, the healthcare provider may try to slide it back into the abdomen.  · Usually no other tests are needed.  TREATMENT   Treatments can vary. The size of the hernia makes a difference. Options include:  · Watchful waiting. This is often suggested if the hernia is small and you have had no symptoms.  · No medical procedure will be done unless symptoms develop.  · You will need to watch closely for symptoms. If any occur, contact your healthcare provider right away.  · Surgery. This is used if the hernia is larger or you have symptoms.  · Open surgery. This is usually an outpatient procedure (you will not stay overnight in a hospital). An cut (incision) is made through the skin in the groin. The hernia is put back inside the abdomen. The weak area in the muscles is   then repaired by herniorrhaphy or hernioplasty. Herniorrhaphy: in this type of surgery, the weak muscles are sewn back together. Hernioplasty: a patch or mesh is used to close the weak area in the abdominal wall.  · Laparoscopy. In this procedure, a surgeon makes small incisions. A thin tube with a tiny video camera (called a laparoscope) is put into the abdomen. The surgeon repairs the hernia with mesh by looking with the video camera and using two long instruments.  HOME CARE INSTRUCTIONS   · After surgery to repair an inguinal hernia:  · You will need to take pain medicine prescribed by your healthcare provider. Follow all directions carefully.  · You will need to take care of the wound from the incision.  · Your activity will be  restricted for awhile. This will probably include no heavy lifting for several weeks. You also should not do anything too active for a few weeks. When you can return to work will depend on the type of job that you have.  · During "watchful waiting" periods, you should:  · Maintain a healthy weight.  · Eat a diet high in fiber (fruits, vegetables and whole grains).  · Drink plenty of fluids to avoid constipation. This means drinking enough water and other liquids to keep your urine clear or pale yellow.  · Do not lift heavy objects.  · Do not stand for long periods of time.  · Quit smoking. This should keep you from developing a frequent cough.  SEEK MEDICAL CARE IF:   · A bulge develops in your groin area.  · You feel pain, a burning sensation or pressure in the groin. This might be worse if you are lifting or straining.  · You develop a fever of more than 100.5° F (38.1° C).  SEEK IMMEDIATE MEDICAL CARE IF:   · Pain in the groin increases suddenly.  · A bulge in the groin gets bigger suddenly and does not go down.  · For men, there is sudden pain in the scrotum. Or, the size of the scrotum increases.  · A bulge in the groin area becomes red or purple and is painful to touch.  · You have nausea or vomiting that does not go away.  · You feel your heart beating much faster than normal.  · You cannot have a bowel movement or pass gas.  · You develop a fever of more than 102.0° F (38.9° C).  Document Released: 09/06/2008 Document Revised: 04/09/2011 Document Reviewed: 09/06/2008  Exit[employer]® Patient Information ©2012 [employer].

## 2012-02-18 ENCOUNTER — Ambulatory Visit (INDEPENDENT_AMBULATORY_CARE_PROVIDER_SITE_OTHER): Payer: Medicare Other | Admitting: Surgery

## 2012-02-25 ENCOUNTER — Encounter (HOSPITAL_COMMUNITY): Payer: Self-pay | Admitting: Pharmacy Technician

## 2012-03-03 ENCOUNTER — Encounter (HOSPITAL_COMMUNITY)
Admission: RE | Admit: 2012-03-03 | Discharge: 2012-03-03 | Disposition: A | Discharge status: Home / Self Care | Payer: Medicare Other | Source: Ambulatory Visit | Attending: Surgery | Admitting: Surgery

## 2012-03-03 ENCOUNTER — Encounter (HOSPITAL_COMMUNITY): Payer: Self-pay

## 2012-03-03 HISTORY — DX: Anxiety disorder, unspecified: F41.9

## 2012-03-03 LAB — BASIC METABOLIC PANEL
BUN: 30 mg/dL — ABNORMAL HIGH (ref 6–23)
CO2: 24 mEq/L (ref 19–32)
Chloride: 104 mEq/L (ref 96–112)
Creatinine, Ser: 1.35 mg/dL (ref 0.50–1.35)
Glucose, Bld: 115 mg/dL — ABNORMAL HIGH (ref 70–99)

## 2012-03-03 LAB — CBC
HCT: 39.3 % (ref 39.0–52.0)
MCV: 93.6 fL (ref 78.0–100.0)
RBC: 4.2 MIL/uL — ABNORMAL LOW (ref 4.22–5.81)
WBC: 5.6 10*3/uL (ref 4.0–10.5)

## 2012-03-03 NOTE — Pre-Procedure Instructions (Signed)
22 Daniel Howe  03/03/2012   Your procedure is scheduled on:  Wednesday 03-09-2012  Report to [facility] at  AM. 1000  Call this number if you have problems the morning of surgery: [phone]   Remember: Do fleets enema on Tuesday night follow instructions on the enema bottle.   Do not drink liquids or  eat food:After Midnight. Tuesday night      Take these medicines the morning of surgery with A SIP OF WATER: Metoprolol   Do not wear jewelry, make-up or nail polish.  Do not wear lotions, powders, or perfumes. You may wear deodorant.  Do not shave 48 hours prior to surgery. Men may shave face and neck.  Do not bring valuables to the hospital.  Contacts, dentures or bridgework may not be worn into surgery.  Leave suitcase in the car. After surgery it may be brought to your room.  For patients admitted to the hospital, checkout time is 11:00 AM the day of discharge.   Patients discharged the day of surgery will not be allowed to drive home.  Name and phone number of your driver: Daniel Howe wife [phone]  See [facility] Preparing for surgery sheet.   Please read over the following fact sheets that you were given: MRSA Information

## 2012-03-03 NOTE — Progress Notes (Signed)
CXR [id number] and EKG 10-12-2011 in Epic

## 2012-03-07 NOTE — Progress Notes (Signed)
BUN 30 elevated no history of kidney problems faxed to Dr. Daphine Deutscher on 03-04-2012 response okay for surgery

## 2012-03-09 ENCOUNTER — Encounter (HOSPITAL_COMMUNITY)
Admission: RE | Disposition: A | Discharge status: Home / Self Care | Payer: Self-pay | Source: Ambulatory Visit | Attending: Surgery

## 2012-03-09 ENCOUNTER — Encounter (HOSPITAL_COMMUNITY): Payer: Self-pay | Admitting: *Deleted

## 2012-03-09 ENCOUNTER — Ambulatory Visit (HOSPITAL_COMMUNITY)
Admission: RE | Admit: 2012-03-09 | Discharge: 2012-03-09 | Disposition: A | Discharge status: Home / Self Care | Payer: Medicare Other | Source: Ambulatory Visit | Attending: Surgery | Admitting: Surgery

## 2012-03-09 ENCOUNTER — Ambulatory Visit (HOSPITAL_COMMUNITY): Payer: Medicare Other | Admitting: Anesthesiology

## 2012-03-09 ENCOUNTER — Encounter (HOSPITAL_COMMUNITY): Payer: Self-pay | Admitting: Anesthesiology

## 2012-03-09 DIAGNOSIS — Z79899 Other long term (current) drug therapy: Secondary | ICD-10-CM | POA: Insufficient documentation

## 2012-03-09 DIAGNOSIS — Z01812 Encounter for preprocedural laboratory examination: Secondary | ICD-10-CM | POA: Insufficient documentation

## 2012-03-09 DIAGNOSIS — E7800 Pure hypercholesterolemia, unspecified: Secondary | ICD-10-CM | POA: Insufficient documentation

## 2012-03-09 DIAGNOSIS — K4030 Unilateral inguinal hernia, with obstruction, without gangrene, not specified as recurrent: Secondary | ICD-10-CM | POA: Insufficient documentation

## 2012-03-09 DIAGNOSIS — I10 Essential (primary) hypertension: Secondary | ICD-10-CM | POA: Insufficient documentation

## 2012-03-09 DIAGNOSIS — Z7982 Long term (current) use of aspirin: Secondary | ICD-10-CM | POA: Insufficient documentation

## 2012-03-09 DIAGNOSIS — K4090 Unilateral inguinal hernia, without obstruction or gangrene, not specified as recurrent: Secondary | ICD-10-CM

## 2012-03-09 HISTORY — PX: HERNIA REPAIR: SHX51

## 2012-03-09 HISTORY — PX: INGUINAL HERNIA REPAIR: SHX194

## 2012-03-09 SURGERY — REPAIR, HERNIA, INGUINAL, ADULT
Anesthesia: General | Site: Groin | Laterality: Left | Wound class: Clean

## 2012-03-09 MED ORDER — BUPIVACAINE LIPOSOME 1.3 % IJ SUSP
20.0000 mL | Freq: Once | INTRAMUSCULAR | Status: DC
  Filled 2012-03-09: qty 20

## 2012-03-09 MED ORDER — KETOROLAC TROMETHAMINE 30 MG/ML IJ SOLN: 15.0000 mg | Freq: Once | INTRAMUSCULAR | Status: DC | PRN

## 2012-03-09 MED ORDER — CEFAZOLIN SODIUM-DEXTROSE 2-3 GM-% IV SOLR
2.0000 g | INTRAVENOUS | Status: AC
  Administered 2012-03-09: 2 g via INTRAVENOUS

## 2012-03-09 MED ORDER — NEOSTIGMINE METHYLSULFATE 1 MG/ML IJ SOLN
INTRAMUSCULAR | Status: DC | PRN
  Administered 2012-03-09: 4 mg via INTRAVENOUS

## 2012-03-09 MED ORDER — ACETAMINOPHEN 10 MG/ML IV SOLN
INTRAVENOUS | Status: DC | PRN
  Administered 2012-03-09: 1000 mg via INTRAVENOUS

## 2012-03-09 MED ORDER — FENTANYL CITRATE 0.05 MG/ML IJ SOLN
INTRAMUSCULAR | Status: DC | PRN
  Administered 2012-03-09: 25 ug via INTRAVENOUS
  Administered 2012-03-09: 100 ug via INTRAVENOUS
  Administered 2012-03-09: 25 ug via INTRAVENOUS
  Administered 2012-03-09: 50 ug via INTRAVENOUS

## 2012-03-09 MED ORDER — LIDOCAINE HCL 1 % IJ SOLN
INTRAMUSCULAR | Status: DC | PRN
  Administered 2012-03-09: 100 mg via INTRADERMAL
  Administered 2012-03-09: 50 mg via INTRADERMAL

## 2012-03-09 MED ORDER — 0.9 % SODIUM CHLORIDE (POUR BTL) OPTIME
TOPICAL | Status: DC | PRN
  Administered 2012-03-09: 1000 mL

## 2012-03-09 MED ORDER — GLYCOPYRROLATE 0.2 MG/ML IJ SOLN
INTRAMUSCULAR | Status: DC | PRN
  Administered 2012-03-09 (×2): 0.2 mg via INTRAVENOUS
  Administered 2012-03-09: 0.4 mg via INTRAVENOUS

## 2012-03-09 MED ORDER — MIDAZOLAM HCL 5 MG/5ML IJ SOLN
INTRAMUSCULAR | Status: DC | PRN
  Administered 2012-03-09: 2 mg via INTRAVENOUS

## 2012-03-09 MED ORDER — LACTATED RINGERS IV SOLN
INTRAVENOUS | Status: DC
  Administered 2012-03-09: 1000 mL via INTRAVENOUS
  Administered 2012-03-09: 15:00:00 via INTRAVENOUS

## 2012-03-09 MED ORDER — EPHEDRINE SULFATE 50 MG/ML IJ SOLN
INTRAMUSCULAR | Status: DC | PRN
  Administered 2012-03-09 (×3): 5 mg via INTRAVENOUS
  Administered 2012-03-09: 10 mg via INTRAVENOUS
  Administered 2012-03-09: 5 mg via INTRAVENOUS

## 2012-03-09 MED ORDER — CHLORHEXIDINE GLUCONATE 4 % EX LIQD
1.0000 "application " | Freq: Once | CUTANEOUS | Status: DC
  Filled 2012-03-09: qty 15

## 2012-03-09 MED ORDER — ACETAMINOPHEN 325 MG PO TABS: 650.0000 mg | ORAL_TABLET | ORAL | Status: DC | PRN

## 2012-03-09 MED ORDER — ROCURONIUM BROMIDE 100 MG/10ML IV SOLN
INTRAVENOUS | Status: DC | PRN
  Administered 2012-03-09: 20 mg via INTRAVENOUS
  Administered 2012-03-09: 50 mg via INTRAVENOUS

## 2012-03-09 MED ORDER — OXYCODONE HCL 5 MG PO TABS: 5.0000 mg | ORAL_TABLET | ORAL | Status: DC | PRN

## 2012-03-09 MED ORDER — SODIUM CHLORIDE 0.9 % IJ SOLN: 3.0000 mL | INTRAMUSCULAR | Status: DC | PRN

## 2012-03-09 MED ORDER — ONDANSETRON HCL 4 MG/2ML IJ SOLN: 4.0000 mg | Freq: Four times a day (QID) | INTRAMUSCULAR | Status: DC | PRN

## 2012-03-09 MED ORDER — SODIUM CHLORIDE 0.9 % IV SOLN: 250.0000 mL | INTRAVENOUS | Status: DC | PRN

## 2012-03-09 MED ORDER — ACETAMINOPHEN 650 MG RE SUPP
650.0000 mg | RECTAL | Status: DC | PRN
  Filled 2012-03-09: qty 1

## 2012-03-09 MED ORDER — BUPIVACAINE LIPOSOME 1.3 % IJ SUSP
INTRAMUSCULAR | Status: DC | PRN
  Administered 2012-03-09: 20 mL

## 2012-03-09 MED ORDER — CEFAZOLIN SODIUM-DEXTROSE 2-3 GM-% IV SOLR
INTRAVENOUS | Status: AC
  Filled 2012-03-09: qty 50

## 2012-03-09 MED ORDER — PROMETHAZINE HCL 25 MG/ML IJ SOLN: 6.2500 mg | INTRAMUSCULAR | Status: DC | PRN

## 2012-03-09 MED ORDER — OXYCODONE-ACETAMINOPHEN 5-325 MG PO TABS: 1.0000 | ORAL_TABLET | ORAL | Status: DC | PRN

## 2012-03-09 MED ORDER — SODIUM CHLORIDE 0.9 % IJ SOLN: 3.0000 mL | Freq: Two times a day (BID) | INTRAMUSCULAR | Status: DC

## 2012-03-09 MED ORDER — ACETAMINOPHEN 10 MG/ML IV SOLN
INTRAVENOUS | Status: AC
  Filled 2012-03-09: qty 100

## 2012-03-09 MED ORDER — PROPOFOL 10 MG/ML IV BOLUS
INTRAVENOUS | Status: DC | PRN
  Administered 2012-03-09: 220 mg via INTRAVENOUS

## 2012-03-09 MED ORDER — FENTANYL CITRATE 0.05 MG/ML IJ SOLN: 25.0000 ug | INTRAMUSCULAR | Status: DC | PRN

## 2012-03-09 MED ORDER — HEPARIN SODIUM (PORCINE) 5000 UNIT/ML IJ SOLN
5000.0000 [IU] | Freq: Once | INTRAMUSCULAR | Status: AC
  Administered 2012-03-09: 5000 [IU] via SUBCUTANEOUS
  Filled 2012-03-09: qty 1

## 2012-03-09 SURGICAL SUPPLY — 45 items
BENZOIN TINCTURE PRP APPL 2/3 (GAUZE/BANDAGES/DRESSINGS) IMPLANT
BLADE HEX COATED 2.75 (ELECTRODE) ×2 IMPLANT
BLADE SURG 15 STRL LF DISP TIS (BLADE) ×1 IMPLANT
BLADE SURG 15 STRL SS (BLADE) ×1
BLADE SURG SZ10 CARB STEEL (BLADE) IMPLANT
CANISTER SUCTION 2500CC (MISCELLANEOUS) ×2 IMPLANT
CLOTH BEACON ORANGE TIMEOUT ST (SAFETY) ×2 IMPLANT
DECANTER SPIKE VIAL GLASS SM (MISCELLANEOUS) IMPLANT
DERMABOND ADVANCED (GAUZE/BANDAGES/DRESSINGS) ×1
DERMABOND ADVANCED .7 DNX12 (GAUZE/BANDAGES/DRESSINGS) ×1 IMPLANT
DISSECTOR ROUND CHERRY 3/8 STR (MISCELLANEOUS) IMPLANT
DRAIN PENROSE 18X1/2 LTX STRL (DRAIN) ×2 IMPLANT
DRAPE LAPAROTOMY TRNSV 102X78 (DRAPE) ×2 IMPLANT
DRSG TEGADERM 2-3/8X2-3/4 SM (GAUZE/BANDAGES/DRESSINGS) IMPLANT
ELECT REM PT RETURN 9FT ADLT (ELECTROSURGICAL) ×2
ELECTRODE REM PT RTRN 9FT ADLT (ELECTROSURGICAL) ×1 IMPLANT
GAUZE SPONGE 4X4 16PLY XRAY LF (GAUZE/BANDAGES/DRESSINGS) IMPLANT
GLOVE BIOGEL M 8.0 STRL (GLOVE) ×2 IMPLANT
GLOVE BIOGEL PI IND STRL 7.0 (GLOVE) ×1 IMPLANT
GLOVE BIOGEL PI INDICATOR 7.0 (GLOVE) ×1
GOWN STRL NON-REIN LRG LVL3 (GOWN DISPOSABLE) ×4 IMPLANT
GOWN STRL REIN XL XLG (GOWN DISPOSABLE) ×6 IMPLANT
KIT BASIN OR (CUSTOM PROCEDURE TRAY) ×2 IMPLANT
MESH BARD SOFT 3X6IN (Mesh General) ×2 IMPLANT
NEEDLE HYPO 25X1 1.5 SAFETY (NEEDLE) ×2 IMPLANT
NS IRRIG 1000ML POUR BTL (IV SOLUTION) ×2 IMPLANT
PACK BASIC VI WITH GOWN DISP (CUSTOM PROCEDURE TRAY) ×2 IMPLANT
PENCIL BUTTON HOLSTER BLD 10FT (ELECTRODE) ×2 IMPLANT
SPONGE GAUZE 4X4 12PLY (GAUZE/BANDAGES/DRESSINGS) IMPLANT
SPONGE LAP 4X18 X RAY DECT (DISPOSABLE) ×6 IMPLANT
STAPLER VISISTAT 35W (STAPLE) IMPLANT
STRIP CLOSURE SKIN 1/2X4 (GAUZE/BANDAGES/DRESSINGS) IMPLANT
SUT PROLENE 0 CT 2 (SUTURE) ×2 IMPLANT
SUT PROLENE 0 CTX CR/8 (SUTURE) ×2 IMPLANT
SUT PROLENE 2 0 CT2 30 (SUTURE) ×2 IMPLANT
SUT SILK 2 0 SH (SUTURE) ×2 IMPLANT
SUT SILK 2 0 SH CR/8 (SUTURE) ×2 IMPLANT
SUT SURGILON 0 BLK (SUTURE) IMPLANT
SUT VIC AB 2-0 SH 27 (SUTURE) ×1
SUT VIC AB 2-0 SH 27X BRD (SUTURE) ×1 IMPLANT
SUT VIC AB 4-0 SH 18 (SUTURE) ×2 IMPLANT
SYR BULB IRRIGATION 50ML (SYRINGE) ×2 IMPLANT
SYR CONTROL 10ML LL (SYRINGE) ×2 IMPLANT
TOWEL OR 17X26 10 PK STRL BLUE (TOWEL DISPOSABLE) ×4 IMPLANT
YANKAUER SUCT BULB TIP 10FT TU (MISCELLANEOUS) IMPLANT

## 2012-03-09 NOTE — Transfer of Care (Signed)
Immediate Anesthesia Transfer of Care Note  Patient: Daniel Howe  Procedure(s) Performed: Procedure(s) (LRB) with comments: HERNIA REPAIR INGUINAL ADULT (Left)  Patient Location: PACU  Anesthesia Type:General  Level of Consciousness: awake, alert  and patient cooperative  Airway & Oxygen Therapy: Patient Spontanous Breathing and Patient connected to face mask  Post-op Assessment: Report given to PACU RN, Post -op Vital signs reviewed and stable and Patient moving all extremities  Post vital signs: Reviewed and stable  Complications: No apparent anesthesia complications

## 2012-03-09 NOTE — H&P (Signed)
Chief Complaint: Left inguinal scrotal hernia  History of Present Illness: Daniel Howe is an 71 y.o. male who has had a left inguinal hernia for some time. 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 That he tried to do a colonoscopy on him but could not negotiate the colon that was obviously involved in this large hernia. I would therefore suspect that this is a large sliding hernia on the left side containing the sigmoid colon.  I discussed repair including complications not limited to recurrence, nerve pain, numbness, or testicular infarction. I mentioned that this would not impair his nerves to his sexual function.  He wants to go and get this scheduled. We'll do this under general anesthesia probably Cond surgery.  Past Medical History   Diagnosis  Date   .  Depression    .  Hypertension     Past Surgical History   Procedure  Date   .  Cataract extraction    .  Corneal transplant     Current Outpatient Prescriptions   Medication  Sig  Dispense  Refill   .  aspirin 81 MG tablet  Take 81 mg by mouth daily.     .  citalopram (CELEXA) 20 MG tablet  take 1 tablet by mouth once daily  90 tablet  3   .  losartan-hydrochlorothiazide (HYZAAR) 100-12.5 MG per tablet  take 1 tablet by mouth once daily for HIGH BLOOD PRESSURE  90 tablet  3   .  metoprolol (LOPRESSOR) 100 MG tablet  Take 1 tablet (100 mg total) by mouth 2 (two) times daily.  180 tablet  3   Review of patient's allergies indicates no known allergies.  Family History   Problem  Relation  Age of Onset   .  Cancer  Neg Hx    .  Early death  Neg Hx    .  Hyperlipidemia  Neg Hx    .  Hypertension  Neg Hx    .  Diabetes  Brother    .  Heart disease  Brother    Social History: reports that he has never smoked. He has never used smokeless tobacco. He reports that he drinks about 8.4 ounces of alcohol per week. He reports that he does not use illicit drugs.  REVIEW OF SYSTEMS - PERTINENT POSITIVES ONLY:  No history of DVT    Physical Exam:  Blood pressure 142/88, pulse 62, temperature 98 F (36.7 C), temperature source Temporal, resp. rate 18, height 6\' 2"  (1.88 m), weight 235 lb (106.595 kg), SpO2 100.00%.  Body mass index is 30.17 kg/(m^2).  Gen: WDWN WM NAD  Neurological: Alert and oriented to person, place, and time. Motor and sensory function is grossly intact  Head: Normocephalic and atraumatic.  Eyes: Conjunctivae are normal. Pupils are equal, round, and reactive to light. No scleral icterus.  Neck: Normal range of motion. Neck supple. No tracheal deviation or thyromegaly present.  Cardiovascular: SR without murmurs or gallops. No carotid bruits  Respiratory: Effort normal. No respiratory distress. No chest wall tenderness. Breath sounds normal. No wheezes, rales or rhonchi.  Abdomen: Large left inguinal hernia that he is able to reduce  GU:  Musculoskeletal: Normal range of motion. Extremities are nontender. No cyanosis, edema or clubbing noted Lymphadenopathy: No cervical, preauricular, postauricular or axillary adenopathy is present Skin: Skin is warm and dry. No rash noted. No diaphoresis. No erythema. No pallor. Pscyh: Normal mood and affect. Behavior is normal. Judgment and thought content normal.  LABORATORY RESULTS:  No results found for this or any previous visit (from the past 48 hour(s)).  RADIOLOGY RESULTS:  No results found.  Problem List:  Patient Active Problem List   Diagnosis   .  DEPRESSION   .  HYPERTENSION   .  Routine general medical examination at a health care facility   .  Pure hypercholesterolemia   .  BPH (benign prostatic hyperplasia)   .  Blood in stool   Assessment & Plan:  Left sliding scrotal hernia containing sigmoid colon  Open left inguinal hernia with mesh  Matt B. Daphine Deutscher, MD, [facility]  [phone] beeper  [phone]

## 2012-03-09 NOTE — Interval H&P Note (Signed)
History and Physical Interval Note:  03/09/2012 1:39 PM  Daniel Howe  has presented today for surgery, with the diagnosis of left scrotal hernia  The various methods of treatment have been discussed with the patient and family. After consideration of risks, benefits and other options for treatment, the patient has consented to  Procedure(s) (LRB) with comments: HERNIA REPAIR INGUINAL ADULT (Left) as a surgical intervention .  The patient's history has been reviewed, patient examined, no change in status, stable for surgery.  I have reviewed the patient's chart and labs.  Questions were answered to the patient's satisfaction.     [name] B

## 2012-03-09 NOTE — Anesthesia Preprocedure Evaluation (Addendum)
Anesthesia Evaluation    Airway Mallampati: III TM Distance: <3 FB Neck ROM: Full    Dental No notable dental hx.    Pulmonary  breath sounds clear to auscultation  Pulmonary exam normal       Cardiovascular hypertension, Pt. on medications Rhythm:Regular Rate:Normal     Neuro/Psych PSYCHIATRIC DISORDERS Anxiety Depression    GI/Hepatic   Endo/Other    Renal/GU      Musculoskeletal   Abdominal   Peds  Hematology   Anesthesia Other Findings   Reproductive/Obstetrics                          Anesthesia Physical Anesthesia Plan  ASA: II  Anesthesia Plan: General   Post-op Pain Management:    Induction: Intravenous  Airway Management Planned: Oral ETT and LMA  Additional Equipment:   Intra-op Plan:   Post-operative Plan:   Informed Consent: I have reviewed the patients History and Physical, chart, labs and discussed the procedure including the risks, benefits and alternatives for the proposed anesthesia with the patient or authorized representative who has indicated his/her understanding and acceptance.   Dental advisory given  Plan Discussed with: CRNA and Surgeon  Anesthesia Plan Comments:         Anesthesia Quick Evaluation

## 2012-03-09 NOTE — Progress Notes (Signed)
Ambulated in hallway s/p hernia surgery. Tolerated well.

## 2012-03-09 NOTE — Brief Op Note (Signed)
03/09/2012  4:19 PM  PATIENT:  Daniel Howe  71 y.o. male  PRE-OPERATIVE DIAGNOSIS:  left scrotal hernia  POST-OPERATIVE DIAGNOSIS:  left scrotal hernia  PROCEDURE:  Procedure(s) (LRB) with comments: HERNIA REPAIR INGUINAL ADULT (Left)  SURGEON:  Surgeon(s) and Role:    * Valarie Merino, MD - Primary  PHYSICIAN ASSISTANT:   ASSISTANTS: none   ANESTHESIA:   general  EBL:  Total I/O In: 1800 [I.V.:1800] Out: -   BLOOD ADMINISTERED:none  DRAINS: none   LOCAL MEDICATIONS USED:  MARCAINE     SPECIMEN:  No Specimen  DISPOSITION OF SPECIMEN:  N/A  COUNTS:  YES  TOURNIQUET:  * No tourniquets in log *  DICTATION: .Other Dictation: Dictation Number [id number]  PLAN OF CARE: Discharge to home after PACU  PATIENT DISPOSITION:  PACU - hemodynamically stable.   Delay start of Pharmacological VTE agent (>24hrs) due to surgical blood loss or risk of bleeding: not applicable

## 2012-03-09 NOTE — Anesthesia Procedure Notes (Signed)
Procedure Name: LMA Insertion Date/Time: 03/09/2012 2:17 PM Performed by: Durward Parcel A Pre-anesthesia Checklist: Patient identified and Patient being monitored Patient Re-evaluated:Patient Re-evaluated prior to inductionOxygen Delivery Method: Circle system utilized Preoxygenation: Pre-oxygenation with 100% oxygen Intubation Type: IV induction Ventilation: Mask ventilation without difficulty LMA: LMA inserted and LMA with gastric port inserted LMA Size: 5.0 Number of attempts: 1 Placement Confirmation: positive ETCO2 and breath sounds checked- equal and bilateral

## 2012-03-09 NOTE — Anesthesia Postprocedure Evaluation (Signed)
  Anesthesia Post-op Note  Patient: Daniel Howe  Procedure(s) Performed: Procedure(s) (LRB): HERNIA REPAIR INGUINAL ADULT (Left)  Patient Location: PACU  Anesthesia Type: General  Level of Consciousness: awake and alert   Airway and Oxygen Therapy: Patient Spontanous Breathing  Post-op Pain: mild  Post-op Assessment: Post-op Vital signs reviewed, Patient's Cardiovascular Status Stable, Respiratory Function Stable, Patent Airway and No signs of Nausea or vomiting  Post-op Vital Signs: stable  Complications: No apparent anesthesia complications

## 2012-03-09 NOTE — Progress Notes (Signed)
PACU-Nursing Note: Report rec'd from CRNA re: EKG rhythm and rate changes occuring intra-op, pt immediately placed on cardiac monitor upon arrival into PACU, NSR w/ 1st degree AV block, pt denies any SOB, chest pain or chest discomfort, S1S2 noted, after approx 20 min in Phase I PACU, EKG noted to reflect continued 1st degree AVB w/ PAC's, pt again denies any palpatations, SOB, chest pain or chest discomfort. Rhythm strip shown to attending [facility].) no further orders rec'd, okay'd by MDA to dc from Phase I PACU to Phase II PACU

## 2012-03-10 ENCOUNTER — Encounter (HOSPITAL_COMMUNITY): Payer: Self-pay | Admitting: Surgery

## 2012-03-10 NOTE — Op Note (Signed)
NAME:  BECKAM, ABDULAZIZ NO.:  [id number]  MEDICAL RECORD NO.:  [id number]  LOCATION:  [facility]                         FACILITY:  [facility]  PHYSICIAN:  Thornton Park. Daphine Deutscher, MD  DATE OF BIRTH:  04-12-1941  DATE OF PROCEDURE: DATE OF DISCHARGE:  03/09/2012                              OPERATIVE REPORT   PREOPERATIVE DIAGNOSIS:  Left scrotal hernia.  POSTOPERATIVE DIAGNOSIS:  Left scrotal hernia containing approximately 1 foot of sigmoid colon, and a sliding hernia.  PROCEDURE:  Open left inguinal herniorrhaphy with Bard mesh.  SURGEON:  Thornton Park. Daphine Deutscher, MD  ASSISTANT:  None.  ANESTHESIA:  General endotracheal.  DESCRIPTION OF THE PROCEDURE:  Mr. Eshbach is a 71 year old white male with a long history of an inguinal hernia that has been scrotal hernia for sometime.  He recently tried to have a colonoscopy and there were unable to pass the colonoscope which brought him in for consideration of an inguinal herniorrhaphy.  After general anesthesia was administered, the inguinal region was clipped and then prepped with PC max and draped sterilely.  An oblique incision was made and carried down to what would appear to be about a grapefruit size mass that I could not reduce.  This was followed the patient an LMA.  The patient was subsequently paralyzed and endotracheal tube placed.  With that, I opened the sac and found at least to 12 inch longer segment of sigmoid colon in this hernia and the neck medially was sliding and the colon was fixed to that.  I went ahead and went high and took down those adhesions and then completely reduced the colon over sewing the sac with a running 2-0 silk.  This reduced inside.  I freed the cord structures.  There was a large internal defect which I then closed with interrupted 2-0 Prolene to try to reduce the size of this somewhat and then repaired the floor with a piece of Bard relaxed mesh with running 2-0 Prolene along the inguinal  ligament and then medially with running 2-0 Prolene and bringing it around the cord structures and suturing it to itself with horizontal mattress suture of 2-0 Prolene.  This was tucked it and the hernia had essentially destroyed the external ring pretty much, and there was not really much to close over this.  I did close the wound in layers with 4-0 Vicryl subcutaneously, subcuticularly with a running 5-0 Monocryl completed the skin closure.  The patient tolerated the procedure well, was taken to recovery room in satisfactory condition.    Thornton Park Daphine Deutscher, MD    MBM/MEDQ  D:  03/09/2012  T:  03/10/2012  Job:  [id number]

## 2012-03-24 ENCOUNTER — Encounter (INDEPENDENT_AMBULATORY_CARE_PROVIDER_SITE_OTHER): Payer: Self-pay | Admitting: Surgery

## 2012-03-24 ENCOUNTER — Ambulatory Visit (INDEPENDENT_AMBULATORY_CARE_PROVIDER_SITE_OTHER): Payer: Medicare Other | Admitting: Surgery

## 2012-03-24 VITALS — BP 120/80 | HR 66 | Temp 97.6°F | Resp 18 | Ht 74.0 in | Wt 234.0 lb

## 2012-03-24 DIAGNOSIS — K4090 Unilateral inguinal hernia, without obstruction or gangrene, not specified as recurrent: Secondary | ICD-10-CM

## 2012-03-24 NOTE — Progress Notes (Signed)
Daniel Howe 71 y.o.  Body mass index is 30.04 kg/(m^2).  Patient Active Problem List  Diagnosis  . DEPRESSION  . HYPERTENSION  . Routine general medical examination at a health care facility  . Pure hypercholesterolemia  . BPH (benign prostatic hyperplasia)  . Blood in stool  . Left scrotal  inguinal hernia-contained 1 foot of sigmoid colon    No Known Allergies  Past Surgical History  Procedure Date  . Cataract extraction   . Corneal transplant   . Inguinal hernia repair 03/09/2012    Procedure: HERNIA REPAIR INGUINAL ADULT;  Surgeon: Valarie Merino, MD;  Location: [facility];  Service: General;  Laterality: Left;  . Hernia repair 03/09/12    lih repair   Sanda Linger, MD No diagnosis found.  Postop giant LIH containing 1 foot of sigmoid colon.  He has a lot of fluid in this vacant cavity but no impulse with coughing.  Will observe.  Encouraged to walk but with scrotal support. \ Return 2 months Matt B. Daphine Deutscher, MD, [facility] [phone] beeper [phone]  03/24/2012 10:22 AM

## 2012-03-24 NOTE — Patient Instructions (Signed)
Wear scrotal support when walking.

## 2012-05-19 ENCOUNTER — Telehealth (INDEPENDENT_AMBULATORY_CARE_PROVIDER_SITE_OTHER): Payer: Self-pay

## 2012-05-19 NOTE — Telephone Encounter (Signed)
LMOM for pt letting him know that MM has Cx'd his office for tomorrow.  I also stated that I would call him with his new appt information as soon as I had it.

## 2012-05-20 ENCOUNTER — Encounter (INDEPENDENT_AMBULATORY_CARE_PROVIDER_SITE_OTHER): Payer: Medicare Other | Admitting: Surgery

## 2012-05-23 ENCOUNTER — Encounter (INDEPENDENT_AMBULATORY_CARE_PROVIDER_SITE_OTHER): Payer: Self-pay | Admitting: Surgery

## 2012-05-23 ENCOUNTER — Ambulatory Visit (INDEPENDENT_AMBULATORY_CARE_PROVIDER_SITE_OTHER): Payer: Medicare Other | Admitting: Surgery

## 2012-05-23 VITALS — BP 124/72 | HR 64 | Temp 98.5°F | Resp 20 | Ht 74.0 in | Wt 239.0 lb

## 2012-05-23 DIAGNOSIS — Z8719 Personal history of other diseases of the digestive system: Secondary | ICD-10-CM

## 2012-05-23 DIAGNOSIS — Z9889 Other specified postprocedural states: Secondary | ICD-10-CM

## 2012-05-23 NOTE — Progress Notes (Signed)
Daniel Howe 72 y.o.  Body mass index is 30.69 kg/(m^2).  Patient Active Problem List  Diagnosis  . DEPRESSION  . HYPERTENSION  . Routine general medical examination at a health care facility  . Pure hypercholesterolemia  . BPH (benign prostatic hyperplasia)  . Blood in stool    No Known Allergies  Past Surgical History  Procedure Date  . Cataract extraction   . Corneal transplant   . Inguinal hernia repair 03/09/2012    Procedure: HERNIA REPAIR INGUINAL ADULT;  Surgeon: Valarie Merino, MD;  Location: [facility];  Service: General;  Laterality: Left;  . Hernia repair 03/09/12    lih repair   Sanda Linger, MD 1. History of left inguinal hernia repair     Doing very well after open left scrotal hernia repair.  The repair is intact. There is no evidence of any recurrence and he is having no pain. His bowels are working regularly. Encouraged him to go and have the colonoscopy. Return when necessary Matt B. Daphine Deutscher, MD, [facility] [phone] beeper [phone]  05/23/2012 3:16 PM

## 2012-05-24 ENCOUNTER — Encounter (INDEPENDENT_AMBULATORY_CARE_PROVIDER_SITE_OTHER): Payer: Medicare Other | Admitting: Surgery

## 2012-07-05 ENCOUNTER — Other Ambulatory Visit: Payer: Self-pay | Admitting: Internal Medicine

## 2012-08-18 ENCOUNTER — Other Ambulatory Visit: Payer: Self-pay | Admitting: Internal Medicine

## 2012-10-04 ENCOUNTER — Other Ambulatory Visit: Payer: Self-pay | Admitting: Internal Medicine

## 2013-03-22 ENCOUNTER — Encounter: Payer: Medicare Other | Admitting: Internal Medicine

## 2013-04-05 ENCOUNTER — Ambulatory Visit (INDEPENDENT_AMBULATORY_CARE_PROVIDER_SITE_OTHER): Payer: Medicare Other | Admitting: Internal Medicine

## 2013-04-05 ENCOUNTER — Encounter: Payer: Self-pay | Admitting: Internal Medicine

## 2013-04-05 ENCOUNTER — Encounter: Payer: Medicare Other | Admitting: Internal Medicine

## 2013-04-05 ENCOUNTER — Other Ambulatory Visit (INDEPENDENT_AMBULATORY_CARE_PROVIDER_SITE_OTHER): Payer: Medicare Other

## 2013-04-05 VITALS — BP 140/98 | HR 69 | Temp 98.6°F | Resp 16 | Ht 74.0 in | Wt 252.0 lb

## 2013-04-05 DIAGNOSIS — R7309 Other abnormal glucose: Secondary | ICD-10-CM

## 2013-04-05 DIAGNOSIS — E781 Pure hyperglyceridemia: Secondary | ICD-10-CM

## 2013-04-05 DIAGNOSIS — R739 Hyperglycemia, unspecified: Secondary | ICD-10-CM | POA: Insufficient documentation

## 2013-04-05 DIAGNOSIS — E7800 Pure hypercholesterolemia, unspecified: Secondary | ICD-10-CM

## 2013-04-05 DIAGNOSIS — I10 Essential (primary) hypertension: Secondary | ICD-10-CM

## 2013-04-05 DIAGNOSIS — Z0000 Encounter for general adult medical examination without abnormal findings: Secondary | ICD-10-CM

## 2013-04-05 LAB — COMPREHENSIVE METABOLIC PANEL
CO2: 24 mEq/L (ref 19–32)
GFR: 47.33 mL/min — ABNORMAL LOW (ref >60.00)
Glucose, Bld: 105 mg/dL — ABNORMAL HIGH (ref 70–99)
Sodium: 138 mEq/L (ref 135–145)
Total Bilirubin: 0.9 mg/dL (ref 0.3–1.2)
Total Protein: 7.4 g/dL (ref 6.0–8.3)

## 2013-04-05 LAB — URINALYSIS, ROUTINE W REFLEX MICROSCOPIC
Hgb urine dipstick: NEGATIVE
Leukocytes, UA: NEGATIVE
Nitrite: NEGATIVE
Urobilinogen, UA: 0.2 (ref 0.0–1.0)

## 2013-04-05 LAB — CBC WITH DIFFERENTIAL/PLATELET
Basophils Absolute: 0 10*3/uL (ref 0.0–0.1)
Eosinophils Absolute: 0.1 10*3/uL (ref 0.0–0.7)
HCT: 39.7 % (ref 39.0–52.0)
Hemoglobin: 14 g/dL (ref 13.0–17.0)
Lymphocytes Relative: 18.9 % (ref 12.0–46.0)
MCHC: 35.3 g/dL (ref 30.0–36.0)
Monocytes Relative: 18.9 % — ABNORMAL HIGH (ref 3.0–12.0)
Neutrophils Relative %: 59.9 % (ref 43.0–77.0)
RDW: 12.7 % (ref 11.5–14.6)
WBC: 8.1 10*3/uL (ref 4.5–10.5)

## 2013-04-05 LAB — LIPID PANEL
Cholesterol: 210 mg/dL — ABNORMAL HIGH (ref 0–200)
HDL: 34 mg/dL — ABNORMAL LOW (ref >39.00)
Triglycerides: 527 mg/dL — ABNORMAL HIGH (ref 0.0–149.0)

## 2013-04-05 LAB — LDL CHOLESTEROL, DIRECT: Direct LDL: 72.5 mg/dL

## 2013-04-05 NOTE — Progress Notes (Signed)
   Subjective:    Patient ID: Daniel Howe, male    DOB: Mar 28, 1941, 72 y.o.   MRN: [id number]  Hypertension This is a chronic problem. The current episode started more than 1 year ago. The problem has been gradually worsening since onset. The problem is uncontrolled. Pertinent negatives include no anxiety, blurred vision, chest pain, headaches, malaise/fatigue, neck pain, orthopnea, palpitations, peripheral edema, PND, shortness of breath or sweats. Risk factors for coronary artery disease include sedentary lifestyle and obesity. Past treatments include angiotensin blockers, diuretics and beta blockers. The current treatment provides mild improvement. Compliance problems include exercise and diet.       Review of Systems  Constitutional: Positive for unexpected weight change (weight gain). Negative for fever, chills, malaise/fatigue, diaphoresis, activity change, appetite change and fatigue.  HENT: Negative.   Eyes: Negative.  Negative for blurred vision.  Respiratory: Negative.  Negative for cough, choking, chest tightness, shortness of breath, wheezing and stridor.   Cardiovascular: Negative.  Negative for chest pain, palpitations, orthopnea, leg swelling and PND.  Gastrointestinal: Negative.  Negative for nausea, vomiting, abdominal pain, diarrhea, constipation and blood in stool.  Endocrine: Negative.   Genitourinary: Negative.   Musculoskeletal: Negative.  Negative for neck pain.  Skin: Negative.   Allergic/Immunologic: Negative.   Neurological: Negative.  Negative for dizziness, tremors, syncope, facial asymmetry, weakness, light-headedness, numbness and headaches.  Hematological: Negative.  Negative for adenopathy. Does not bruise/bleed easily.  Psychiatric/Behavioral: Negative.        Objective:   Physical Exam  Vitals reviewed. Constitutional: He is oriented to person, place, and time. He appears well-developed and well-nourished. No distress.  HENT:  Head:  Normocephalic and atraumatic.  Mouth/Throat: Oropharynx is clear and moist. No oropharyngeal exudate.  Eyes: Conjunctivae are normal. Right eye exhibits no discharge. Left eye exhibits no discharge. No scleral icterus.  Neck: Normal range of motion. Neck supple. No JVD present. No tracheal deviation present. No thyromegaly present.  Cardiovascular: Normal rate, regular rhythm, normal heart sounds and intact distal pulses.  Exam reveals no gallop and no friction rub.   No murmur heard. Pulmonary/Chest: Effort normal and breath sounds normal. No stridor. No respiratory distress. He has no wheezes. He has no rales. He exhibits no tenderness.  Abdominal: Soft. Bowel sounds are normal. He exhibits no distension and no mass. There is no tenderness. There is no rebound and no guarding.  Musculoskeletal: Normal range of motion. He exhibits no edema and no tenderness.  Lymphadenopathy:    He has no cervical adenopathy.  Neurological: He is oriented to person, place, and time.  Skin: Skin is warm and dry. No rash noted. He is not diaphoretic. No erythema. No pallor.  Psychiatric: He has a normal mood and affect. His behavior is normal. Judgment and thought content normal.     Lab Results  Component Value Date   WBC 5.6 03/03/2012   HGB 13.9 03/03/2012   HCT 39.3 03/03/2012   PLT 216 03/03/2012   GLUCOSE 115* 03/03/2012   CHOL 178 10/13/2011   TRIG 371.0* 10/13/2011   HDL 33.40* 10/13/2011   LDLDIRECT 59.9 10/13/2011   ALT 37 10/13/2011   AST 25 10/13/2011   NA 138 03/03/2012   K 4.3 03/03/2012   CL 104 03/03/2012   CREATININE 1.35 03/03/2012   BUN 30* 03/03/2012   CO2 24 03/03/2012   TSH 3.17 10/13/2011   PSA 1.45 10/13/2011       Assessment & Plan:

## 2013-04-05 NOTE — Progress Notes (Signed)
Pre visit review using our clinic review tool, if applicable. No additional management support is needed unless otherwise documented below in the visit note. 

## 2013-04-05 NOTE — Patient Instructions (Signed)
Health Maintenance, Males A healthy lifestyle and preventative care can promote health and wellness.  Maintain regular health, dental, and eye exams.  Eat a healthy diet. Foods like vegetables, fruits, whole grains, low-fat dairy products, and lean protein foods contain the nutrients you need without too many calories. Decrease your intake of foods high in solid fats, added sugars, and salt. Get information about a proper diet from your caregiver, if necessary.  Regular physical exercise is one of the most important things you can do for your health. Most adults should get at least 150 minutes of moderate-intensity exercise (any activity that increases your heart rate and causes you to sweat) each week. In addition, most adults need muscle-strengthening exercises on 2 or more days a week.   Maintain a healthy weight. The body mass index (BMI) is a screening tool to identify possible weight problems. It provides an estimate of body fat based on height and weight. Your caregiver can help determine your BMI, and can help you achieve or maintain a healthy weight. For adults 20 years and older:  A BMI below 18.5 is considered underweight.  A BMI of 18.5 to 24.9 is normal.  A BMI of 25 to 29.9 is considered overweight.  A BMI of 30 and above is considered obese.  Maintain normal blood lipids and cholesterol by exercising and minimizing your intake of saturated fat. Eat a balanced diet with plenty of fruits and vegetables. Blood tests for lipids and cholesterol should begin at age 20 and be repeated every 5 years. If your lipid or cholesterol levels are high, you are over 50, or you are a high risk for heart disease, you may need your cholesterol levels checked more frequently.Ongoing high lipid and cholesterol levels should be treated with medicines, if diet and exercise are not effective.  If you smoke, find out from your caregiver how to quit. If you do not use tobacco, do not start.  Lung  cancer screening is recommended for adults aged 55 80 years who are at high risk for developing lung cancer because of a history of smoking. Yearly low-dose computed tomography (CT) is recommended for people who have at least a 30-pack-year history of smoking and are a current smoker or have quit within the past 15 years. A pack year of smoking is smoking an average of 1 pack of cigarettes a day for 1 year (for example: 1 pack a day for 30 years or 2 packs a day for 15 years). Yearly screening should continue until the smoker has stopped smoking for at least 15 years. Yearly screening should also be stopped for people who develop a health problem that would prevent them from having lung cancer treatment.  If you choose to drink alcohol, do not exceed 2 drinks per day. One drink is considered to be 12 ounces (355 mL) of beer, 5 ounces (148 mL) of wine, or 1.5 ounces (44 mL) of liquor.  Avoid use of street drugs. Do not share needles with anyone. Ask for help if you need support or instructions about stopping the use of drugs.  High blood pressure causes heart disease and increases the risk of stroke. Blood pressure should be checked at least every 1 to 2 years. Ongoing high blood pressure should be treated with medicines if weight loss and exercise are not effective.  If you are 45 to 70 years old, ask your caregiver if you should take aspirin to prevent heart disease.  Diabetes screening involves taking a blood   sample to check your fasting blood sugar level. This should be done once every 3 years, after age 45, if you are within normal weight and without risk factors for diabetes. Testing should be considered at a younger age or be carried out more frequently if you are overweight and have at least 1 risk factor for diabetes.  Colorectal cancer can be detected and often prevented. Most routine colorectal cancer screening begins at the age of 50 and continues through age 75. However, your caregiver may  recommend screening at an earlier age if you have risk factors for colon cancer. On a yearly basis, your caregiver may provide home test kits to check for hidden blood in the stool. Use of a small camera at the end of a tube, to directly examine the colon (sigmoidoscopy or colonoscopy), can detect the earliest forms of colorectal cancer. Talk to your caregiver about this at age 50, when routine screening begins. Direct examination of the colon should be repeated every 5 to 10 years through age 75, unless early forms of pre-cancerous polyps or small growths are found.  Hepatitis C blood testing is recommended for all people born from 1945 through 1965 and any individual with known risks for hepatitis C.  Healthy men should no longer receive prostate-specific antigen (PSA) blood tests as part of routine cancer screening. Consult with your caregiver about prostate cancer screening.  Testicular cancer screening is not recommended for adolescents or adult males who have no symptoms. Screening includes self-exam, caregiver exam, and other screening tests. Consult with your caregiver about any symptoms you have or any concerns you have about testicular cancer.  Practice safe sex. Use condoms and avoid high-risk sexual practices to reduce the spread of sexually transmitted infections (STIs).  Use sunscreen. Apply sunscreen liberally and repeatedly throughout the day. You should seek shade when your shadow is shorter than you. Protect yourself by wearing long sleeves, pants, a wide-brimmed hat, and sunglasses year round, whenever you are outdoors.  Notify your caregiver of new moles or changes in moles, especially if there is a change in shape or color. Also notify your caregiver if a mole is larger than the size of a pencil eraser.  A one-time screening for abdominal aortic aneurysm (AAA) and surgical repair of large AAAs by sound wave imaging (ultrasonography) is recommended for ages 65 to 75 years who are  current or former smokers.  Stay current with your immunizations. Document Released: 10/17/2007 Document Revised: 08/15/2012 Document Reviewed: 09/15/2010 [employer] Patient Information 2014 [employer]. Hypertension As your heart beats, it forces blood through your arteries. This force is your blood pressure. If the pressure is too high, it is called hypertension (HTN) or high blood pressure. HTN is dangerous because you may have it and not know it. High blood pressure may mean that your heart has to work harder to pump blood. Your arteries may be narrow or stiff. The extra work puts you at risk for heart disease, stroke, and other problems.  Blood pressure consists of two numbers, a higher number over a lower, 110/72, for example. It is stated as "110 over 72." The ideal is below 120 for the top number (systolic) and under 80 for the bottom (diastolic). Write down your blood pressure today. You should pay close attention to your blood pressure if you have certain conditions such as:  Heart failure.  Prior heart attack.  Diabetes  Chronic kidney disease.  Prior stroke.  Multiple risk factors for heart disease. To see   if you have HTN, your blood pressure should be measured while you are seated with your arm held at the level of the heart. It should be measured at least twice. A one-time elevated blood pressure reading (especially in the Emergency Department) does not mean that you need treatment. There may be conditions in which the blood pressure is different between your right and left arms. It is important to see your caregiver soon for a recheck. Most people have essential hypertension which means that there is not a specific cause. This type of high blood pressure may be lowered by changing lifestyle factors such as:  Stress.  Smoking.  Lack of exercise.  Excessive weight.  Drug/tobacco/alcohol use.  Eating less salt. Most people do not have symptoms from high blood pressure  until it has caused damage to the body. Effective treatment can often prevent, delay or reduce that damage. TREATMENT  When a cause has been identified, treatment for high blood pressure is directed at the cause. There are a large number of medications to treat HTN. These fall into several categories, and your caregiver will help you select the medicines that are best for you. Medications may have side effects. You should review side effects with your caregiver. If your blood pressure stays high after you have made lifestyle changes or started on medicines,   Your medication(s) may need to be changed.  Other problems may need to be addressed.  Be certain you understand your prescriptions, and know how and when to take your medicine.  Be sure to follow up with your caregiver within the time frame advised (usually within two weeks) to have your blood pressure rechecked and to review your medications.  If you are taking more than one medicine to lower your blood pressure, make sure you know how and at what times they should be taken. Taking two medicines at the same time can result in blood pressure that is too low. SEEK IMMEDIATE MEDICAL CARE IF:  You develop a severe headache, blurred or changing vision, or confusion.  You have unusual weakness or numbness, or a faint feeling.  You have severe chest or abdominal pain, vomiting, or breathing problems. MAKE SURE YOU:   Understand these instructions.  Will watch your condition.  Will get help right away if you are not doing well or get worse. Document Released: 04/20/2005 Document Revised: 07/13/2011 Document Reviewed: 12/09/2007 [employer] Patient Information 2014 [employer].  

## 2013-04-06 ENCOUNTER — Encounter: Payer: Self-pay | Admitting: Internal Medicine

## 2013-04-06 NOTE — Assessment & Plan Note (Signed)
His BP is not well controlled, this is mostly due to a lapse in his lifestyle modifications He will RTC in 2-3 months for a recheck and will improve his lifestyle modifications

## 2013-04-06 NOTE — Assessment & Plan Note (Signed)
I will check his A1C to see if he has developed DM2 

## 2013-04-06 NOTE — Assessment & Plan Note (Signed)
He will work on his lifestyle modifications to lose weight 

## 2013-04-06 NOTE — Assessment & Plan Note (Signed)
His LDL is low and he is not interested in taking a statin

## 2013-04-06 NOTE — Assessment & Plan Note (Signed)

## 2013-05-15 ENCOUNTER — Other Ambulatory Visit: Payer: Self-pay | Admitting: Internal Medicine

## 2013-07-05 ENCOUNTER — Other Ambulatory Visit: Payer: Self-pay | Admitting: Internal Medicine

## 2013-10-09 ENCOUNTER — Other Ambulatory Visit: Payer: Self-pay

## 2013-10-09 MED ORDER — METOPROLOL TARTRATE 100 MG PO TABS: ORAL_TABLET | ORAL | Status: DC

## 2013-10-27 ENCOUNTER — Ambulatory Visit (INDEPENDENT_AMBULATORY_CARE_PROVIDER_SITE_OTHER)
Admission: RE | Admit: 2013-10-27 | Discharge: 2013-10-27 | Disposition: A | Discharge status: Home / Self Care | Payer: Medicare Other | Source: Ambulatory Visit | Attending: Internal Medicine | Admitting: Internal Medicine

## 2013-10-27 ENCOUNTER — Encounter: Payer: Self-pay | Admitting: Internal Medicine

## 2013-10-27 ENCOUNTER — Ambulatory Visit (INDEPENDENT_AMBULATORY_CARE_PROVIDER_SITE_OTHER): Payer: Medicare Other | Admitting: Internal Medicine

## 2013-10-27 VITALS — BP 138/84 | HR 77 | Temp 98.8°F | Resp 16 | Ht 74.0 in | Wt 243.4 lb

## 2013-10-27 DIAGNOSIS — R05 Cough: Secondary | ICD-10-CM

## 2013-10-27 DIAGNOSIS — R059 Cough, unspecified: Secondary | ICD-10-CM

## 2013-10-27 DIAGNOSIS — J13 Pneumonia due to Streptococcus pneumoniae: Secondary | ICD-10-CM

## 2013-10-27 DIAGNOSIS — I10 Essential (primary) hypertension: Secondary | ICD-10-CM

## 2013-10-27 MED ORDER — HYDROCODONE-HOMATROPINE 5-1.5 MG/5ML PO SYRP: 5.0000 mL | ORAL_SOLUTION | Freq: Three times a day (TID) | ORAL | Status: DC | PRN

## 2013-10-27 MED ORDER — AZITHROMYCIN 500 MG PO TABS: 500.0000 mg | ORAL_TABLET | Freq: Every day | ORAL | Status: DC

## 2013-10-27 NOTE — Progress Notes (Signed)
Subjective:    Patient ID: Daniel Howe, male    DOB: 1940/10/02, 73 y.o.   MRN: [id number]  Cough This is a new problem. The current episode started yesterday. The problem has been gradually worsening. The problem occurs every few hours. The cough is productive of purulent sputum. Associated symptoms include chills, postnasal drip and a sore throat. Pertinent negatives include no chest pain, ear congestion, ear pain, fever, headaches, heartburn, hemoptysis, myalgias, nasal congestion, rash, rhinorrhea, shortness of breath, sweats, weight loss or wheezing. He has tried OTC cough suppressant for the symptoms. The treatment provided mild relief. There is no history of asthma, bronchiectasis, bronchitis, COPD, emphysema, environmental allergies or pneumonia.      Review of Systems  Constitutional: Positive for chills. Negative for fever, weight loss, diaphoresis, appetite change and fatigue.  HENT: Positive for postnasal drip and sore throat. Negative for ear pain and rhinorrhea.   Eyes: Negative.   Respiratory: Positive for cough. Negative for apnea, hemoptysis, choking, chest tightness, shortness of breath, wheezing and stridor.   Cardiovascular: Negative.  Negative for chest pain, palpitations and leg swelling.  Gastrointestinal: Negative.  Negative for heartburn, nausea, vomiting, abdominal pain, diarrhea and constipation.  Endocrine: Negative.   Genitourinary: Negative.   Musculoskeletal: Negative.  Negative for arthralgias, back pain, myalgias and neck stiffness.  Skin: Negative.  Negative for rash.  Allergic/Immunologic: Negative.  Negative for environmental allergies.  Neurological: Negative.  Negative for headaches.  Hematological: Negative.  Negative for adenopathy. Does not bruise/bleed easily.  Psychiatric/Behavioral: Negative.        Objective:   Physical Exam  Vitals reviewed. Constitutional: He is oriented to person, place, and time. He appears well-developed and  well-nourished.  Non-toxic appearance. He does not have a sickly appearance. He does not appear ill. No distress.  HENT:  Head: Normocephalic and atraumatic.  Mouth/Throat: Mucous membranes are normal. Mucous membranes are not pale, not dry and not cyanotic. No oral lesions. No trismus in the jaw. No uvula swelling. Posterior oropharyngeal erythema present. No oropharyngeal exudate, posterior oropharyngeal edema or tonsillar abscesses.  Eyes: Conjunctivae are normal. Right eye exhibits no discharge. Left eye exhibits no discharge. No scleral icterus.  Neck: Normal range of motion. Neck supple. No JVD present. No tracheal deviation present. No thyromegaly present.  Cardiovascular: Normal rate, regular rhythm, normal heart sounds and intact distal pulses.  Exam reveals no gallop and no friction rub.   No murmur heard. Pulmonary/Chest: Effort normal and breath sounds normal. No stridor. No respiratory distress. He has no wheezes. He has no rales. He exhibits no tenderness.  Abdominal: Soft. Bowel sounds are normal. He exhibits no distension and no mass. There is no tenderness. There is no rebound and no guarding.  Musculoskeletal: Normal range of motion. He exhibits no edema and no tenderness.  Lymphadenopathy:    He has no cervical adenopathy.  Neurological: He is oriented to person, place, and time.  Skin: Skin is warm and dry. No rash noted. He is not diaphoretic. No erythema. No pallor.     Lab Results  Component Value Date   WBC 8.1 04/05/2013   HGB 14.0 04/05/2013   HCT 39.7 04/05/2013   PLT 220.0 04/05/2013   GLUCOSE 105* 04/05/2013   CHOL 210* 04/05/2013   TRIG 527.0 Triglyceride is over 400; calculations on Lipids are invalid.* 04/05/2013   HDL 34.00* 04/05/2013   LDLDIRECT 72.5 04/05/2013   ALT 35 04/05/2013   AST 25 04/05/2013   NA 138 04/05/2013  K 5.0 04/05/2013   CL 108 04/05/2013   CREATININE 1.6* 04/05/2013   BUN 29* 04/05/2013   CO2 24 04/05/2013   TSH 4.16 04/05/2013   PSA 1.45  10/13/2011   HGBA1C 5.3 04/05/2013       Assessment & Plan:

## 2013-10-27 NOTE — Patient Instructions (Signed)
Cough, Adult  A cough is a reflex that helps clear your throat and airways. It can help heal the body or may be a reaction to an irritated airway. A cough may only last 2 or 3 weeks (acute) or may last more than 8 weeks (chronic).  CAUSES Acute cough:  Viral or bacterial infections. Chronic cough:  Infections.  Allergies.  Asthma.  Post-nasal drip.  Smoking.  Heartburn or acid reflux.  Some medicines.  Chronic lung problems (COPD).  Cancer. SYMPTOMS   Cough.  Fever.  Chest pain.  Increased breathing rate.  High-pitched whistling sound when breathing (wheezing).  Colored mucus that you cough up (sputum). TREATMENT   A bacterial cough may be treated with antibiotic medicine.  A viral cough must run its course and will not respond to antibiotics.  Your caregiver may recommend other treatments if you have a chronic cough. HOME CARE INSTRUCTIONS   Only take over-the-counter or prescription medicines for pain, discomfort, or fever as directed by your caregiver. Use cough suppressants only as directed by your caregiver.  Use a cold steam vaporizer or humidifier in your bedroom or home to help loosen secretions.  Sleep in a semi-upright position if your cough is worse at night.  Rest as needed.  Stop smoking if you smoke. SEEK IMMEDIATE MEDICAL CARE IF:   You have pus in your sputum.  Your cough starts to worsen.  You cannot control your cough with suppressants and are losing sleep.  You begin coughing up blood.  You have difficulty breathing.  You develop pain which is getting worse or is uncontrolled with medicine.  You have a fever. MAKE SURE YOU:   Understand these instructions.  Will watch your condition.  Will get help right away if you are not doing well or get worse. Document Released: 10/17/2010 Document Revised: 07/13/2011 Document Reviewed: 10/17/2010 [employer] Patient Information 2015 [employer]. This information is not intended  to replace advice given to you by your health care Libbey Duce. Make sure you discuss any questions you have with your health care Taige Housman.  

## 2013-10-27 NOTE — Progress Notes (Signed)
Pre visit review using our clinic review tool, if applicable. No additional management support is needed unless otherwise documented below in the visit note. 

## 2013-10-28 ENCOUNTER — Encounter: Payer: Self-pay | Admitting: Internal Medicine

## 2013-10-28 NOTE — Assessment & Plan Note (Signed)
His CXR is normal 

## 2013-10-28 NOTE — Assessment & Plan Note (Signed)
His BP is well controlled 

## 2013-10-28 NOTE — Assessment & Plan Note (Signed)
Will treat the infection with Zpak and will control the cough with hycodan 

## 2013-11-15 ENCOUNTER — Other Ambulatory Visit (INDEPENDENT_AMBULATORY_CARE_PROVIDER_SITE_OTHER): Payer: Medicare Other

## 2013-11-15 ENCOUNTER — Encounter: Payer: Self-pay | Admitting: Internal Medicine

## 2013-11-15 ENCOUNTER — Ambulatory Visit (INDEPENDENT_AMBULATORY_CARE_PROVIDER_SITE_OTHER): Payer: Medicare Other | Admitting: Internal Medicine

## 2013-11-15 VITALS — BP 140/82 | HR 58 | Temp 98.3°F | Resp 16 | Ht 74.0 in | Wt 237.0 lb

## 2013-11-15 DIAGNOSIS — R7309 Other abnormal glucose: Secondary | ICD-10-CM

## 2013-11-15 DIAGNOSIS — E781 Pure hyperglyceridemia: Secondary | ICD-10-CM

## 2013-11-15 DIAGNOSIS — I10 Essential (primary) hypertension: Secondary | ICD-10-CM

## 2013-11-15 DIAGNOSIS — F3289 Other specified depressive episodes: Secondary | ICD-10-CM

## 2013-11-15 DIAGNOSIS — E7800 Pure hypercholesterolemia, unspecified: Secondary | ICD-10-CM

## 2013-11-15 DIAGNOSIS — F329 Major depressive disorder, single episode, unspecified: Secondary | ICD-10-CM

## 2013-11-15 DIAGNOSIS — Z23 Encounter for immunization: Secondary | ICD-10-CM

## 2013-11-15 DIAGNOSIS — N183 Chronic kidney disease, stage 3 unspecified: Secondary | ICD-10-CM

## 2013-11-15 LAB — LIPID PANEL
CHOL/HDL RATIO: 5
Cholesterol: 183 mg/dL (ref 0–200)
HDL: 34.2 mg/dL — ABNORMAL LOW (ref >39.00)
NonHDL: 148.8
Triglycerides: 455 mg/dL — ABNORMAL HIGH (ref 0.0–149.0)
VLDL: 91 mg/dL — AB (ref 0.0–40.0)

## 2013-11-15 LAB — BASIC METABOLIC PANEL
BUN: 41 mg/dL — ABNORMAL HIGH (ref 6–23)
CHLORIDE: 107 meq/L (ref 96–112)
CO2: 25 meq/L (ref 19–32)
Calcium: 9 mg/dL (ref 8.4–10.5)
Creatinine, Ser: 1.6 mg/dL — ABNORMAL HIGH (ref 0.4–1.5)
GFR: 44.27 mL/min — ABNORMAL LOW (ref >60.00)
GLUCOSE: 112 mg/dL — AB (ref 70–99)
Potassium: 4.7 mEq/L (ref 3.5–5.1)
Sodium: 136 mEq/L (ref 135–145)

## 2013-11-15 LAB — HEMOGLOBIN A1C: HEMOGLOBIN A1C: 5.3 % (ref 4.6–6.5)

## 2013-11-15 NOTE — Progress Notes (Signed)
   Subjective:    Patient ID: Daniel Howe, male    DOB: 1940-06-13, 73 y.o.   MRN: [id number]  Hypertension This is a chronic problem. The current episode started more than 1 year ago. The problem is unchanged. The problem is controlled. Pertinent negatives include no anxiety, blurred vision, chest pain, headaches, malaise/fatigue, neck pain, orthopnea, palpitations, peripheral edema, PND, shortness of breath or sweats. Past treatments include angiotensin blockers, beta blockers and diuretics. The current treatment provides moderate improvement. Compliance problems include diet and exercise.       Review of Systems  Constitutional: Negative.  Negative for fever, chills, malaise/fatigue, diaphoresis, appetite change and fatigue.  HENT: Negative.   Eyes: Negative.  Negative for blurred vision.  Respiratory: Negative.  Negative for apnea, cough, choking, chest tightness, shortness of breath, wheezing and stridor.   Cardiovascular: Negative.  Negative for chest pain, palpitations, orthopnea, leg swelling and PND.  Gastrointestinal: Negative.  Negative for nausea, abdominal pain, diarrhea, constipation and blood in stool.  Endocrine: Negative.  Negative for polydipsia, polyphagia and polyuria.  Genitourinary: Negative.   Musculoskeletal: Negative.  Negative for arthralgias, myalgias and neck pain.  Skin: Negative.  Negative for rash.  Allergic/Immunologic: Negative.   Neurological: Negative.  Negative for dizziness, tremors, syncope, light-headedness, numbness and headaches.  Hematological: Negative.  Negative for adenopathy. Does not bruise/bleed easily.  Psychiatric/Behavioral: Negative.        Objective:   Physical Exam  Vitals reviewed. Constitutional: He is oriented to person, place, and time. He appears well-developed and well-nourished. No distress.  HENT:  Head: Normocephalic and atraumatic.  Mouth/Throat: Oropharynx is clear and moist. No oropharyngeal exudate.  Eyes:  Conjunctivae are normal. Right eye exhibits no discharge. Left eye exhibits no discharge. No scleral icterus.  Neck: Normal range of motion. Neck supple. No JVD present. No tracheal deviation present. No thyromegaly present.  Cardiovascular: Normal rate, regular rhythm, normal heart sounds and intact distal pulses.  Exam reveals no gallop and no friction rub.   No murmur heard. Pulmonary/Chest: Effort normal and breath sounds normal. No stridor. No respiratory distress. He has no wheezes. He has no rales. He exhibits no tenderness.  Abdominal: Soft. Bowel sounds are normal. He exhibits no distension and no mass. There is no tenderness. There is no rebound and no guarding.  Musculoskeletal: Normal range of motion. He exhibits no edema and no tenderness.  Lymphadenopathy:    He has no cervical adenopathy.  Neurological: He is oriented to person, place, and time.  Skin: Skin is warm and dry. No rash noted. He is not diaphoretic. No erythema. No pallor.     Lab Results  Component Value Date   WBC 8.1 04/05/2013   HGB 14.0 04/05/2013   HCT 39.7 04/05/2013   PLT 220.0 04/05/2013   GLUCOSE 105* 04/05/2013   CHOL 210* 04/05/2013   TRIG 527.0 Triglyceride is over 400; calculations on Lipids are invalid.* 04/05/2013   HDL 34.00* 04/05/2013   LDLDIRECT 72.5 04/05/2013   ALT 35 04/05/2013   AST 25 04/05/2013   NA 138 04/05/2013   K 5.0 04/05/2013   CL 108 04/05/2013   CREATININE 1.6* 04/05/2013   BUN 29* 04/05/2013   CO2 24 04/05/2013   TSH 4.16 04/05/2013   PSA 1.45 10/13/2011   HGBA1C 5.3 04/05/2013       Assessment & Plan:

## 2013-11-15 NOTE — Assessment & Plan Note (Signed)
This may be related to the use of diuretics to control his BP and some dehydration The levels have been stable so I have asked him to be better hydrated

## 2013-11-15 NOTE — Progress Notes (Signed)
Pre visit review using our clinic review tool, if applicable. No additional management support is needed unless otherwise documented below in the visit note. 

## 2013-11-15 NOTE — Assessment & Plan Note (Signed)
Some improvement noted with lifestyle modifications

## 2013-11-15 NOTE — Patient Instructions (Signed)

## 2014-01-19 ENCOUNTER — Other Ambulatory Visit: Payer: Self-pay | Admitting: Internal Medicine

## 2014-01-19 DIAGNOSIS — J13 Pneumonia due to Streptococcus pneumoniae: Secondary | ICD-10-CM

## 2014-01-19 DIAGNOSIS — R059 Cough, unspecified: Secondary | ICD-10-CM

## 2014-01-19 DIAGNOSIS — R05 Cough: Secondary | ICD-10-CM

## 2014-01-19 MED ORDER — HYDROCODONE-HOMATROPINE 5-1.5 MG/5ML PO SYRP: 5.0000 mL | ORAL_SOLUTION | Freq: Three times a day (TID) | ORAL | Status: DC | PRN

## 2014-01-19 MED ORDER — AZITHROMYCIN 500 MG PO TABS: 500.0000 mg | ORAL_TABLET | Freq: Every day | ORAL | Status: DC

## 2014-01-29 ENCOUNTER — Ambulatory Visit (INDEPENDENT_AMBULATORY_CARE_PROVIDER_SITE_OTHER): Payer: Medicare Other

## 2014-01-29 DIAGNOSIS — Z23 Encounter for immunization: Secondary | ICD-10-CM

## 2014-02-15 ENCOUNTER — Other Ambulatory Visit: Payer: Self-pay

## 2014-02-15 MED ORDER — CITALOPRAM HYDROBROMIDE 20 MG PO TABS: ORAL_TABLET | ORAL | Status: DC

## 2014-03-15 ENCOUNTER — Ambulatory Visit (INDEPENDENT_AMBULATORY_CARE_PROVIDER_SITE_OTHER): Payer: Medicare Other | Admitting: Internal Medicine

## 2014-03-15 ENCOUNTER — Encounter: Payer: Self-pay | Admitting: Internal Medicine

## 2014-03-15 VITALS — BP 120/80 | HR 62 | Temp 98.2°F | Resp 16 | Ht 74.0 in | Wt 237.0 lb

## 2014-03-15 DIAGNOSIS — Z0000 Encounter for general adult medical examination without abnormal findings: Secondary | ICD-10-CM

## 2014-03-15 DIAGNOSIS — I10 Essential (primary) hypertension: Secondary | ICD-10-CM

## 2014-03-15 DIAGNOSIS — E781 Pure hyperglyceridemia: Secondary | ICD-10-CM

## 2014-03-15 DIAGNOSIS — E7800 Pure hypercholesterolemia, unspecified: Secondary | ICD-10-CM

## 2014-03-15 DIAGNOSIS — R739 Hyperglycemia, unspecified: Secondary | ICD-10-CM

## 2014-03-15 DIAGNOSIS — N183 Chronic kidney disease, stage 3 unspecified: Secondary | ICD-10-CM

## 2014-03-15 DIAGNOSIS — N400 Enlarged prostate without lower urinary tract symptoms: Secondary | ICD-10-CM

## 2014-03-15 LAB — FECAL OCCULT BLOOD, GUAIAC: Fecal Occult Blood: NEGATIVE

## 2014-03-15 NOTE — Assessment & Plan Note (Signed)

## 2014-03-15 NOTE — Progress Notes (Signed)
Subjective:    Patient ID: Daniel Howe, male    DOB: Aug 08, 1940, 73 y.o.   MRN: [id number]  Hypertension This is a chronic problem. The current episode started more than 1 year ago. The problem has been gradually improving since onset. The problem is controlled. Pertinent negatives include no anxiety, blurred vision, chest pain, headaches, malaise/fatigue, neck pain, orthopnea, palpitations, peripheral edema, PND, shortness of breath or sweats. Past treatments include angiotensin blockers and diuretics. The current treatment provides significant improvement. There are no compliance problems.  Hypertensive end-organ damage includes kidney disease. Identifiable causes of hypertension include chronic renal disease.      Review of Systems  Constitutional: Negative.  Negative for fever, chills, malaise/fatigue, diaphoresis, appetite change and fatigue.  HENT: Negative.   Eyes: Negative.  Negative for blurred vision.  Respiratory: Negative.  Negative for apnea, cough, choking, chest tightness, shortness of breath, wheezing and stridor.   Cardiovascular: Negative.  Negative for chest pain, palpitations, orthopnea, leg swelling and PND.  Gastrointestinal: Negative.  Negative for abdominal pain, diarrhea and constipation.  Endocrine: Negative.   Genitourinary: Negative.   Musculoskeletal: Negative.  Negative for neck pain.  Skin: Negative.   Allergic/Immunologic: Negative.   Neurological: Negative.  Negative for dizziness and headaches.  Hematological: Negative.  Negative for adenopathy. Does not bruise/bleed easily.  Psychiatric/Behavioral: Negative.        Objective:   Physical Exam  Constitutional: He is oriented to person, place, and time. He appears well-developed and well-nourished. No distress.  HENT:  Head: Normocephalic and atraumatic.  Mouth/Throat: Oropharynx is clear and moist. No oropharyngeal exudate.  Eyes: Conjunctivae are normal. Right eye exhibits no discharge. Left  eye exhibits no discharge. No scleral icterus.  Neck: Normal range of motion. Neck supple. No JVD present. No tracheal deviation present. No thyromegaly present.  Cardiovascular: Normal rate, regular rhythm, normal heart sounds and intact distal pulses.  Exam reveals no gallop and no friction rub.   No murmur heard. Pulmonary/Chest: Effort normal and breath sounds normal. No stridor. No respiratory distress. He has no wheezes. He has no rales. He exhibits no tenderness.  Abdominal: Soft. Bowel sounds are normal. He exhibits no distension and no mass. There is no tenderness. There is no rebound and no guarding. Hernia confirmed negative in the right inguinal area and confirmed negative in the left inguinal area.  Genitourinary: Rectum normal, testes normal and penis normal. Rectal exam shows no external hemorrhoid, no internal hemorrhoid, no fissure, no mass, no tenderness and anal tone normal. Guaiac negative stool. 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 Prostate is not tender. Right testis shows no mass, no swelling and no tenderness. Right testis is descended. Left testis shows no mass, no swelling and no tenderness. Left testis is descended. Circumcised. No penile erythema or penile tenderness. No discharge found.  Musculoskeletal: Normal range of motion. He exhibits no edema or tenderness.  Lymphadenopathy:    He has no cervical adenopathy.       Right: No inguinal adenopathy present.       Left: No inguinal adenopathy present.  Neurological: He is oriented to person, place, and time.  Skin: Skin is warm and dry. No rash noted. He is not diaphoretic. No erythema. No pallor.  Psychiatric: He has a normal mood and affect. His behavior is normal. Judgment and thought content normal.  Vitals reviewed.    Lab Results  Component Value Date   WBC 8.1 04/05/2013   HGB 14.0 04/05/2013   HCT  39.7 04/05/2013   PLT 220.0 04/05/2013   GLUCOSE 112* 11/15/2013   CHOL 183 11/15/2013   TRIG 455.0*  11/15/2013   HDL 34.20* 11/15/2013   LDLDIRECT 72.5 04/05/2013   ALT 35 04/05/2013   AST 25 04/05/2013   NA 136 11/15/2013   K 4.7 11/15/2013   CL 107 11/15/2013   CREATININE 1.6* 11/15/2013   BUN 41* 11/15/2013   CO2 25 11/15/2013   TSH 4.16 04/05/2013   PSA 1.45 10/13/2011   HGBA1C 5.3 11/15/2013       Assessment & Plan:

## 2014-03-15 NOTE — Assessment & Plan Note (Signed)
He will avoid nephrotoxic agents I will recheck his renal function today

## 2014-03-15 NOTE — Assessment & Plan Note (Signed)
He has no s/s that need to be treated I will recheck his PSA to screen for cancer

## 2014-03-15 NOTE — Assessment & Plan Note (Signed)
His BP is well controlled I will monitor his lytes and renal function 

## 2014-03-15 NOTE — Assessment & Plan Note (Signed)
He is working on his lifestyle modifications I will recheck her FLP and will treat if trigs are > 500

## 2014-03-15 NOTE — Progress Notes (Signed)
Pre visit review using our clinic review tool, if applicable. No additional management support is needed unless otherwise documented below in the visit note. 

## 2014-03-15 NOTE — Patient Instructions (Signed)
Hypertension Hypertension, commonly called high blood pressure, is when the force of blood pumping through your arteries is too strong. Your arteries are the blood vessels that carry blood from your heart throughout your body. A blood pressure reading consists of a higher number over a lower number, such as 110/72. The higher number (systolic) is the pressure inside your arteries when your heart pumps. The lower number (diastolic) is the pressure inside your arteries when your heart relaxes. Ideally you want your blood pressure below 120/80. Hypertension forces your heart to work harder to pump blood. Your arteries may become narrow or stiff. Having hypertension puts you at risk for heart disease, stroke, and other problems.  RISK FACTORS Some risk factors for high blood pressure are controllable. Others are not.  Risk factors you cannot control include:   Race. You may be at higher risk if you are African American.  Age. Risk increases with age.  Gender. Men are at higher risk than women before age 45 years. After age 65, women are at higher risk than men. Risk factors you can control include:  Not getting enough exercise or physical activity.  Being overweight.  Getting too much fat, sugar, calories, or salt in your diet.  Drinking too much alcohol. SIGNS AND SYMPTOMS Hypertension does not usually cause signs or symptoms. Extremely high blood pressure (hypertensive crisis) may cause headache, anxiety, shortness of breath, and nosebleed. DIAGNOSIS  To check if you have hypertension, your health care provider will measure your blood pressure while you are seated, with your arm held at the level of your heart. It should be measured at least twice using the same arm. Certain conditions can cause a difference in blood pressure between your right and left arms. A blood pressure reading that is higher than normal on one occasion does not mean that you need treatment. If one blood pressure reading  is high, ask your health care provider about having it checked again. TREATMENT  Treating high blood pressure includes making lifestyle changes and possibly taking medicine. Living a healthy lifestyle can help lower high blood pressure. You may need to change some of your habits. Lifestyle changes may include:  Following the DASH diet. This diet is high in fruits, vegetables, and whole grains. It is low in salt, red meat, and added sugars.  Getting at least 2 hours of brisk physical activity every week.  Losing weight if necessary.  Not smoking.  Limiting alcoholic beverages.  Learning ways to reduce stress. If lifestyle changes are not enough to get your blood pressure under control, your health care provider may prescribe medicine. You may need to take more than one. Work closely with your health care provider to understand the risks and benefits. HOME CARE INSTRUCTIONS  Have your blood pressure rechecked as directed by your health care provider.   Take medicines only as directed by your health care provider. Follow the directions carefully. Blood pressure medicines must be taken as prescribed. The medicine does not work as well when you skip doses. Skipping doses also puts you at risk for problems.   Do not smoke.   Monitor your blood pressure at home as directed by your health care provider. SEEK MEDICAL CARE IF:   You think you are having a reaction to medicines taken.  You have recurrent headaches or feel dizzy.  You have swelling in your ankles.  You have trouble with your vision. SEEK IMMEDIATE MEDICAL CARE IF:  You develop a severe headache or confusion.    You have unusual weakness, numbness, or feel faint.  You have severe chest or abdominal pain.  You vomit repeatedly.  You have trouble breathing. MAKE SURE YOU:   Understand these instructions.  Will watch your condition.  Will get help right away if you are not doing well or get worse. Document  Released: 04/20/2005 Document Revised: 09/04/2013 Document Reviewed: 02/10/2013 [employer] Patient Information 2015 [employer]. This information is not intended to replace advice given to you by your health care provider. Make sure you discuss any questions you have with your health care provider. Health Maintenance A healthy lifestyle and preventative care can promote health and wellness.  Maintain regular health, dental, and eye exams.  Eat a healthy diet. Foods like vegetables, fruits, whole grains, low-fat dairy products, and lean protein foods contain the nutrients you need and are low in calories. Decrease your intake of foods high in solid fats, added sugars, and salt. Get information about a proper diet from your health care provider, if necessary.  Regular physical exercise is one of the most important things you can do for your health. Most adults should get at least 150 minutes of moderate-intensity exercise (any activity that increases your heart rate and causes you to sweat) each week. In addition, most adults need muscle-strengthening exercises on 2 or more days a week.   Maintain a healthy weight. The body mass index (BMI) is a screening tool to identify possible weight problems. It provides an estimate of body fat based on height and weight. Your health care provider can find your BMI and can help you achieve or maintain a healthy weight. For males 20 years and older:  A BMI below 18.5 is considered underweight.  A BMI of 18.5 to 24.9 is normal.  A BMI of 25 to 29.9 is considered overweight.  A BMI of 30 and above is considered obese.  Maintain normal blood lipids and cholesterol by exercising and minimizing your intake of saturated fat. Eat a balanced diet with plenty of fruits and vegetables. Blood tests for lipids and cholesterol should begin at age 20 and be repeated every 5 years. If your lipid or cholesterol levels are high, you are over age 50, or you are at high risk  for heart disease, you may need your cholesterol levels checked more frequently.Ongoing high lipid and cholesterol levels should be treated with medicines if diet and exercise are not working.  If you smoke, find out from your health care provider how to quit. If you do not use tobacco, do not start.  Lung cancer screening is recommended for adults aged 55-80 years who are at high risk for developing lung cancer because of a history of smoking. A yearly low-dose CT scan of the lungs is recommended for people who have at least a 30-pack-year history of smoking and are current smokers or have quit within the past 15 years. A pack year of smoking is smoking an average of 1 pack of cigarettes a day for 1 year (for example, a 30-pack-year history of smoking could mean smoking 1 pack a day for 30 years or 2 packs a day for 15 years). Yearly screening should continue until the smoker has stopped smoking for at least 15 years. Yearly screening should be stopped for people who develop a health problem that would prevent them from having lung cancer treatment.  If you choose to drink alcohol, do not have more than 2 drinks per day. One drink is considered to be 12 oz (  360 mL) of beer, 5 oz (150 mL) of wine, or 1.5 oz (45 mL) of liquor.  Avoid the use of street drugs. Do not share needles with anyone. Ask for help if you need support or instructions about stopping the use of drugs.  High blood pressure causes heart disease and increases the risk of stroke. Blood pressure should be checked at least every 1-2 years. Ongoing high blood pressure should be treated with medicines if weight loss and exercise are not effective.  If you are 45-79 years old, ask your health care provider if you should take aspirin to prevent heart disease.  Diabetes screening involves taking a blood sample to check your fasting blood sugar level. This should be done once every 3 years after age 45 if you are at a normal weight and without  risk factors for diabetes. Testing should be considered at a younger age or be carried out more frequently if you are overweight and have at least 1 risk factor for diabetes.  Colorectal cancer can be detected and often prevented. Most routine colorectal cancer screening begins at the age of 50 and continues through age 75. However, your health care provider may recommend screening at an earlier age if you have risk factors for colon cancer. On a yearly basis, your health care provider may provide home test kits to check for hidden blood in the stool. A small camera at the end of a tube may be used to directly examine the colon (sigmoidoscopy or colonoscopy) to detect the earliest forms of colorectal cancer. Talk to your health care provider about this at age 50 when routine screening begins. A direct exam of the colon should be repeated every 5-10 years through age 75, unless early forms of precancerous polyps or small growths are found.  People who are at an increased risk for hepatitis B should be screened for this virus. You are considered at high risk for hepatitis B if:  You were born in a country where hepatitis B occurs often. Talk with your health care provider about which countries are considered high risk.  Your parents were born in a high-risk country and you have not received a shot to protect against hepatitis B (hepatitis B vaccine).  You have HIV or AIDS.  You use needles to inject street drugs.  You live with, or have sex with, someone who has hepatitis B.  You are a man who has sex with other men (MSM).  You get hemodialysis treatment.  You take certain medicines for conditions like cancer, organ transplantation, and autoimmune conditions.  Hepatitis C blood testing is recommended for all people born from 1945 through 1965 and any individual with known risk factors for hepatitis C.  Healthy men should no longer receive prostate-specific antigen (PSA) blood tests as part of  routine cancer screening. Talk to your health care provider about prostate cancer screening.  Testicular cancer screening is not recommended for adolescents or adult males who have no symptoms. Screening includes self-exam, a health care provider exam, and other screening tests. Consult with your health care provider about any symptoms you have or any concerns you have about testicular cancer.  Practice safe sex. Use condoms and avoid high-risk sexual practices to reduce the spread of sexually transmitted infections (STIs).  You should be screened for STIs, including gonorrhea and chlamydia if:  You are sexually active and are younger than 24 years.  You are older than 24 years, and your health care provider tells you   that you are at risk for this type of infection.  Your sexual activity has changed since you were last screened, and you are at an increased risk for chlamydia or gonorrhea. Ask your health care provider if you are at risk.  If you are at risk of being infected with HIV, it is recommended that you take a prescription medicine daily to prevent HIV infection. This is called pre-exposure prophylaxis (PrEP). You are considered at risk if:  You are a man who has sex with other men (MSM).  You are a heterosexual man who is sexually active with multiple partners.  You take drugs by injection.  You are sexually active with a partner who has HIV.  Talk with your health care provider about whether you are at high risk of being infected with HIV. If you choose to begin PrEP, you should first be tested for HIV. You should then be tested every 3 months for as long as you are taking PrEP.  Use sunscreen. Apply sunscreen liberally and repeatedly throughout the day. You should seek shade when your shadow is shorter than you. Protect yourself by wearing long sleeves, pants, a wide-brimmed hat, and sunglasses year round whenever you are outdoors.  Tell your health care provider of new moles  or changes in moles, especially if there is a change in shape or color. Also, tell your health care provider if a mole is larger than the size of a pencil eraser.  A one-time screening for abdominal aortic aneurysm (AAA) and surgical repair of large AAAs by ultrasound is recommended for men aged 65-75 years who are current or former smokers.  Stay current with your vaccines (immunizations). Document Released: 10/17/2007 Document Revised: 04/25/2013 Document Reviewed: 09/15/2010 [employer] Patient Information 2015 [employer]. This information is not intended to replace advice given to you by your health care provider. Make sure you discuss any questions you have with your health care provider.  

## 2014-03-16 ENCOUNTER — Other Ambulatory Visit (INDEPENDENT_AMBULATORY_CARE_PROVIDER_SITE_OTHER): Payer: Medicare Other

## 2014-03-16 ENCOUNTER — Encounter: Payer: Self-pay | Admitting: Internal Medicine

## 2014-03-16 DIAGNOSIS — E7800 Pure hypercholesterolemia, unspecified: Secondary | ICD-10-CM

## 2014-03-16 DIAGNOSIS — N400 Enlarged prostate without lower urinary tract symptoms: Secondary | ICD-10-CM

## 2014-03-16 DIAGNOSIS — E781 Pure hyperglyceridemia: Secondary | ICD-10-CM

## 2014-03-16 DIAGNOSIS — N183 Chronic kidney disease, stage 3 unspecified: Secondary | ICD-10-CM

## 2014-03-16 DIAGNOSIS — I10 Essential (primary) hypertension: Secondary | ICD-10-CM

## 2014-03-16 DIAGNOSIS — R739 Hyperglycemia, unspecified: Secondary | ICD-10-CM

## 2014-03-16 LAB — COMPREHENSIVE METABOLIC PANEL
ALBUMIN: 3.5 g/dL (ref 3.5–5.2)
ALT: 41 U/L (ref 0–53)
AST: 30 U/L (ref 0–37)
Alkaline Phosphatase: 46 U/L (ref 39–117)
BUN: 36 mg/dL — ABNORMAL HIGH (ref 6–23)
CHLORIDE: 104 meq/L (ref 96–112)
CO2: 23 mEq/L (ref 19–32)
Calcium: 8.6 mg/dL (ref 8.4–10.5)
Creatinine, Ser: 1.6 mg/dL — ABNORMAL HIGH (ref 0.4–1.5)
GFR: 45.5 mL/min — ABNORMAL LOW (ref >60.00)
Glucose, Bld: 107 mg/dL — ABNORMAL HIGH (ref 70–99)
Potassium: 4.4 mEq/L (ref 3.5–5.1)
Sodium: 134 mEq/L — ABNORMAL LOW (ref 135–145)
Total Bilirubin: 0.8 mg/dL (ref 0.2–1.2)
Total Protein: 7 g/dL (ref 6.0–8.3)

## 2014-03-16 LAB — CBC WITH DIFFERENTIAL/PLATELET
Basophils Absolute: 0 10*3/uL (ref 0.0–0.1)
Basophils Relative: 0.6 % (ref 0.0–3.0)
EOS ABS: 0.2 10*3/uL (ref 0.0–0.7)
Eosinophils Relative: 2.4 % (ref 0.0–5.0)
HCT: 38.8 % — ABNORMAL LOW (ref 39.0–52.0)
HEMOGLOBIN: 13.4 g/dL (ref 13.0–17.0)
LYMPHS PCT: 28.2 % (ref 12.0–46.0)
Lymphs Abs: 1.9 10*3/uL (ref 0.7–4.0)
MCHC: 34.7 g/dL (ref 30.0–36.0)
MCV: 95.2 fl (ref 78.0–100.0)
MONO ABS: 1.2 10*3/uL — AB (ref 0.1–1.0)
Monocytes Relative: 17.1 % — ABNORMAL HIGH (ref 3.0–12.0)
NEUTROS ABS: 3.5 10*3/uL (ref 1.4–7.7)
Neutrophils Relative %: 51.7 % (ref 43.0–77.0)
Platelets: 233 10*3/uL (ref 150.0–400.0)
RBC: 4.08 Mil/uL — ABNORMAL LOW (ref 4.22–5.81)
RDW: 12.6 % (ref 11.5–15.5)
WBC: 6.7 10*3/uL (ref 4.0–10.5)

## 2014-03-16 LAB — URINALYSIS, ROUTINE W REFLEX MICROSCOPIC
Bilirubin Urine: NEGATIVE
Hgb urine dipstick: NEGATIVE
KETONES UR: NEGATIVE
Leukocytes, UA: NEGATIVE
Nitrite: NEGATIVE
PH: 5.5 (ref 5.0–8.0)
RBC / HPF: NONE SEEN (ref 0–?)
SPECIFIC GRAVITY, URINE: 1.025 (ref 1.000–1.030)
Total Protein, Urine: NEGATIVE
URINE GLUCOSE: NEGATIVE
Urobilinogen, UA: 0.2 (ref 0.0–1.0)

## 2014-03-16 LAB — LIPID PANEL
CHOL/HDL RATIO: 8
Cholesterol: 189 mg/dL (ref 0–200)
HDL: 22.8 mg/dL — ABNORMAL LOW (ref >39.00)
NONHDL: 166.2
Triglycerides: 404 mg/dL — ABNORMAL HIGH (ref 0.0–149.0)
VLDL: 80.8 mg/dL — ABNORMAL HIGH (ref 0.0–40.0)

## 2014-03-16 LAB — HEMOGLOBIN A1C: Hgb A1c MFr Bld: 5.1 % (ref 4.6–6.5)

## 2014-03-16 LAB — LDL CHOLESTEROL, DIRECT: LDL DIRECT: 69.8 mg/dL

## 2014-03-16 LAB — PSA: PSA: 2.01 ng/mL (ref 0.10–4.00)

## 2014-03-16 LAB — TSH: TSH: 3.1 u[IU]/mL (ref 0.35–4.50)

## 2014-03-21 ENCOUNTER — Telehealth: Payer: Self-pay | Admitting: Internal Medicine

## 2014-03-21 NOTE — Telephone Encounter (Signed)
emmi emailed °

## 2014-06-12 DIAGNOSIS — L570 Actinic keratosis: Secondary | ICD-10-CM | POA: Diagnosis not present

## 2014-06-12 DIAGNOSIS — D1801 Hemangioma of skin and subcutaneous tissue: Secondary | ICD-10-CM | POA: Diagnosis not present

## 2014-06-12 DIAGNOSIS — D2362 Other benign neoplasm of skin of left upper limb, including shoulder: Secondary | ICD-10-CM | POA: Diagnosis not present

## 2014-06-12 DIAGNOSIS — L821 Other seborrheic keratosis: Secondary | ICD-10-CM | POA: Diagnosis not present

## 2014-06-12 DIAGNOSIS — L111 Transient acantholytic dermatosis [Grover]: Secondary | ICD-10-CM | POA: Diagnosis not present

## 2014-06-24 ENCOUNTER — Other Ambulatory Visit: Payer: Self-pay | Admitting: Internal Medicine

## 2014-06-24 DIAGNOSIS — J209 Acute bronchitis, unspecified: Secondary | ICD-10-CM

## 2014-06-24 DIAGNOSIS — R059 Cough, unspecified: Secondary | ICD-10-CM

## 2014-06-24 DIAGNOSIS — R05 Cough: Secondary | ICD-10-CM

## 2014-06-24 MED ORDER — HYDROCODONE-HOMATROPINE 5-1.5 MG/5ML PO SYRP: 5.0000 mL | ORAL_SOLUTION | Freq: Three times a day (TID) | ORAL | Status: AC | PRN

## 2014-06-26 ENCOUNTER — Other Ambulatory Visit: Payer: Self-pay | Admitting: *Deleted

## 2014-06-26 MED ORDER — LOSARTAN POTASSIUM-HCTZ 100-12.5 MG PO TABS: 1.0000 | ORAL_TABLET | Freq: Every day | ORAL | Status: DC

## 2014-08-31 NOTE — Telephone Encounter (Signed)
error:[id number] ° °

## 2014-10-06 ENCOUNTER — Other Ambulatory Visit: Payer: Self-pay | Admitting: Internal Medicine

## 2014-11-26 ENCOUNTER — Other Ambulatory Visit: Payer: Self-pay

## 2014-11-26 MED ORDER — CITALOPRAM HYDROBROMIDE 20 MG PO TABS: ORAL_TABLET | ORAL | Status: DC

## 2015-01-17 DIAGNOSIS — H6123 Impacted cerumen, bilateral: Secondary | ICD-10-CM | POA: Diagnosis not present

## 2015-04-17 DIAGNOSIS — H1851 Endothelial corneal dystrophy: Secondary | ICD-10-CM | POA: Diagnosis not present

## 2015-05-27 ENCOUNTER — Telehealth: Payer: Self-pay

## 2015-05-27 NOTE — Telephone Encounter (Signed)
Pt has not been seen since 03/2014. He needs an appt in order to fill this rf request ( CPE )

## 2015-05-28 NOTE — Telephone Encounter (Signed)
I have schedule pt on 2/8

## 2015-06-12 ENCOUNTER — Ambulatory Visit (INDEPENDENT_AMBULATORY_CARE_PROVIDER_SITE_OTHER): Payer: Medicare Other | Admitting: Internal Medicine

## 2015-06-12 ENCOUNTER — Encounter: Payer: Self-pay | Admitting: Internal Medicine

## 2015-06-12 VITALS — BP 140/88 | HR 61 | Temp 97.7°F | Resp 16 | Ht 74.0 in | Wt 244.0 lb

## 2015-06-12 DIAGNOSIS — N183 Chronic kidney disease, stage 3 unspecified: Secondary | ICD-10-CM

## 2015-06-12 DIAGNOSIS — E781 Pure hyperglyceridemia: Secondary | ICD-10-CM

## 2015-06-12 DIAGNOSIS — R739 Hyperglycemia, unspecified: Secondary | ICD-10-CM

## 2015-06-12 DIAGNOSIS — E7800 Pure hypercholesterolemia, unspecified: Secondary | ICD-10-CM

## 2015-06-12 DIAGNOSIS — G4730 Sleep apnea, unspecified: Secondary | ICD-10-CM

## 2015-06-12 DIAGNOSIS — I10 Essential (primary) hypertension: Secondary | ICD-10-CM

## 2015-06-12 DIAGNOSIS — F32A Depression, unspecified: Secondary | ICD-10-CM

## 2015-06-12 DIAGNOSIS — Z0000 Encounter for general adult medical examination without abnormal findings: Secondary | ICD-10-CM

## 2015-06-12 DIAGNOSIS — N400 Enlarged prostate without lower urinary tract symptoms: Secondary | ICD-10-CM

## 2015-06-12 DIAGNOSIS — F329 Major depressive disorder, single episode, unspecified: Secondary | ICD-10-CM

## 2015-06-12 LAB — FECAL OCCULT BLOOD, GUAIAC: FECAL OCCULT BLD: NEGATIVE

## 2015-06-12 NOTE — Progress Notes (Signed)
Pre visit review using our clinic review tool, if applicable. No additional management support is needed unless otherwise documented below in the visit note. 

## 2015-06-12 NOTE — Progress Notes (Signed)
Subjective:  Patient ID: Daniel Howe, male    DOB: [birth year]  Age: 75 y.o. MRN: [id number]  CC: Annual Exam; Hypertension; and Hyperlipidemia   HPI SNEH LIGOCKI presents for a CPX as well as follow-up on hypertension and hypercholesterolemia. His wife expresses some concern about his sleep. He has not been successful with his lifestyle modifications. He complains that he has gained some weight over the last year. He denies chest pain, palpitations, dyspnea on exertion, edema, diaphoresis, or fatigue.  Outpatient Prescriptions Prior to Visit  Medication Sig Dispense Refill  . aspirin 81 MG tablet Take 81 mg by mouth daily.    Marland Kitchen losartan-hydrochlorothiazide (HYZAAR) 100-12.5 MG per tablet Take 1 tablet by mouth daily. for high blood pressure 90 tablet 3  . metoprolol (LOPRESSOR) 100 MG tablet take 1 tablet by mouth twice a day 180 tablet 3  . citalopram (CELEXA) 20 MG tablet take 1 tablet by mouth once daily 90 tablet 1   No facility-administered medications prior to visit.    ROS Review of Systems  Constitutional: Positive for unexpected weight change. Negative for fever, chills, diaphoresis, appetite change and fatigue.  HENT: Negative.   Eyes: Negative.   Respiratory: Positive for apnea. Negative for cough, choking, shortness of breath and stridor.        His wife complains about his sleep, she tells me that he has heavy snoring, concerns for apnea, thrashing throughout the night, and sleeping off and on throughout the day sitting up in a chair.  Cardiovascular: Negative.  Negative for chest pain, palpitations and leg swelling.  Gastrointestinal: Negative.  Negative for nausea, vomiting, abdominal pain, diarrhea, constipation and blood in stool.  Endocrine: Negative.   Genitourinary: Negative.  Negative for dysuria, urgency, frequency, hematuria, decreased urine volume, discharge, penile swelling, scrotal swelling and difficulty urinating.  Musculoskeletal: Negative.   Negative for myalgias, back pain, joint swelling and arthralgias.  Skin: Negative.  Negative for color change, pallor and rash.  Allergic/Immunologic: Negative.   Neurological: Negative.  Negative for dizziness, syncope, speech difficulty, light-headedness, numbness and headaches.  Hematological: Negative.  Negative for adenopathy. Does not bruise/bleed easily.  Psychiatric/Behavioral: Negative.     Objective:  BP 140/88 mmHg  Pulse 61  Temp(Src) 97.7 F (36.5 C) (Oral)  Resp 16  Ht 6\' 2"  (1.88 m)  Wt 244 lb (110.678 kg)  BMI 31.31 kg/m2  SpO2 98%  BP Readings from Last 3 Encounters:  06/12/15 140/88  03/15/14 120/80  11/15/13 140/82    Wt Readings from Last 3 Encounters:  06/12/15 244 lb (110.678 kg)  03/15/14 237 lb (107.502 kg)  11/15/13 237 lb (107.502 kg)    Physical Exam  Constitutional: He is oriented to person, place, and time. He appears well-developed and well-nourished. No distress.  HENT:  Head: Normocephalic and atraumatic.  Mouth/Throat: Oropharynx is clear and moist. No oropharyngeal exudate.  Eyes: Conjunctivae are normal. Right eye exhibits no discharge. Left eye exhibits no discharge. No scleral icterus.  Neck: Normal range of motion. Neck supple. No JVD present. No tracheal deviation present. No thyromegaly present.  Cardiovascular: Normal rate, regular rhythm, normal heart sounds and intact distal pulses.  Exam reveals no gallop and no friction rub.   No murmur heard. Pulmonary/Chest: Effort normal and breath sounds normal. No stridor. No respiratory distress. He has no wheezes. He has no rales. He exhibits no tenderness.  Abdominal: Soft. Bowel sounds are normal. He exhibits no distension and no mass. There is no tenderness. There  is no rebound and no guarding. Hernia confirmed negative in the right inguinal area and confirmed negative in the left inguinal area.  Genitourinary: Rectum normal, testes normal and penis normal. Rectal exam shows no external  hemorrhoid, no internal hemorrhoid, no fissure, no mass, no tenderness and anal tone normal. Guaiac negative stool. Prostate is enlarged (2+ smooth symm BPH). Prostate is not tender. Right testis shows no mass, no swelling and no tenderness. Right testis is descended. Left testis shows no mass, no swelling and no tenderness. Left testis is descended. Circumcised. No penile erythema or penile tenderness. No discharge found.  Musculoskeletal: Normal range of motion. He exhibits no edema or tenderness.  Lymphadenopathy:    He has no cervical adenopathy.       Right: No inguinal adenopathy present.       Left: No inguinal adenopathy present.  Neurological: He is alert and oriented to person, place, and time. He has normal reflexes. He displays normal reflexes. No cranial nerve deficit. He exhibits normal muscle tone. Coordination normal.  Skin: Skin is warm and dry. No rash noted. He is not diaphoretic. No erythema. No pallor.  Psychiatric: He has a normal mood and affect. His behavior is normal. Judgment and thought content normal.  Vitals reviewed.   Lab Results  Component Value Date   WBC 6.8 06/13/2015   HGB 13.7 06/13/2015   HCT 39.4 06/13/2015   PLT 240.0 06/13/2015   GLUCOSE 108* 06/13/2015   CHOL 185 06/13/2015   TRIG 349.0* 06/13/2015   HDL 31.40* 06/13/2015   LDLDIRECT 60.0 06/13/2015   ALT 43 06/13/2015   AST 25 06/13/2015   NA 138 06/13/2015   K 4.7 06/13/2015   CL 104 06/13/2015   CREATININE 1.50 06/13/2015   BUN 30* 06/13/2015   CO2 29 06/13/2015   TSH 3.73 06/13/2015   PSA 2.73 06/13/2015   HGBA1C 5.2 06/13/2015    Dg Chest 2 View  10/27/2013  CLINICAL DATA:  Congestion, shortness of Breath EXAM: CHEST  2 VIEW COMPARISON:  07/22/2011 FINDINGS: Cardiomediastinal silhouette is stable. No acute infiltrate or pleural effusion. No pulmonary edema. Mild elevation of the right hemidiaphragm again noted. Mild degenerative changes thoracic spine. IMPRESSION: No active  cardiopulmonary disease. Electronically Signed   By: Lahoma Crocker M.D.   On: 10/27/2013 12:24    Assessment & Plan:   Zadok was seen today for annual exam, hypertension and hyperlipidemia.  Diagnoses and all orders for this visit:  Essential hypertension- his EKG shows first-degree AV block, this was seen on EKG from 4 years ago and may be related to his beta blocker therapy, he is currently asymptomatic regarding this and it is a stable condition documented on prior EKG, no further intervention is needed at this time however I will monitor his EKG on an annual basis, his blood pressure is well-controlled today, his lites and renal function are stable. -     Urinalysis, Routine w reflex microscopic (not at [facility]); Future -     Comprehensive metabolic panel; Future -     CBC with Differential/Platelet; Future -     ELECTROCARDIOGRAM REPORT; Standing -     ELECTROCARDIOGRAM REPORT  BPH (benign prostatic hyperplasia)- his PSA has risen slightly but his exam is unremarkable, I will continue to monitor this on a six-month basis. -     PSA; Future  Kidney disease, chronic, stage III (GFR 30-59 ml/min)- he remains mildly prerenal to some degree related to his diuretic therapy as well as  lack of free water intake, will continue to maintain good blood pressure control and he agrees to avoid nephrotoxic agents. -     Urinalysis, Routine w reflex microscopic (not at [facility]); Future -     Comprehensive metabolic panel; Future  Hyperglycemia- improvement noted -     Hemoglobin A1c; Future  Pure hypercholesterolemia- his Framingham risk was 25% so I've asked him to start a statin in addition to all of his other lifestyle modifications -     Lipid panel; Future -     TSH; Future -     atorvastatin (LIPITOR) 40 MG tablet; Take 1 tablet (40 mg total) by mouth daily.  Pure hyperglyceridemia- his tragus are elevated though not near the 500 levels so I do not recommend treatment at this time, he agrees to  work on his lifestyle modifications. -     Lipid panel; Future  Depression, controlled -     citalopram (CELEXA) 20 MG tablet; take 1 tablet by mouth once daily  Sleep apnea -     Ambulatory referral to Pulmonology   I am having Mr. Stodghill start on atorvastatin. I am also having him maintain his aspirin, losartan-hydrochlorothiazide, metoprolol, and citalopram.  Meds ordered this encounter  Medications  . atorvastatin (LIPITOR) 40 MG tablet    Sig: Take 1 tablet (40 mg total) by mouth daily.    Dispense:  90 tablet    Refill:  3  . citalopram (CELEXA) 20 MG tablet    Sig: take 1 tablet by mouth once daily    Dispense:  90 tablet    Refill:  3   See AVS for instructions about healthy living and anticipatory guidance.  Follow-up: Return if symptoms worsen or fail to improve.  Scarlette Calico, MD

## 2015-06-12 NOTE — Patient Instructions (Signed)

## 2015-06-13 ENCOUNTER — Encounter: Payer: Self-pay | Admitting: Internal Medicine

## 2015-06-13 ENCOUNTER — Other Ambulatory Visit (INDEPENDENT_AMBULATORY_CARE_PROVIDER_SITE_OTHER): Payer: Medicare Other

## 2015-06-13 DIAGNOSIS — L111 Transient acantholytic dermatosis [Grover]: Secondary | ICD-10-CM | POA: Diagnosis not present

## 2015-06-13 DIAGNOSIS — N183 Chronic kidney disease, stage 3 unspecified: Secondary | ICD-10-CM

## 2015-06-13 DIAGNOSIS — D1801 Hemangioma of skin and subcutaneous tissue: Secondary | ICD-10-CM | POA: Diagnosis not present

## 2015-06-13 DIAGNOSIS — E7800 Pure hypercholesterolemia, unspecified: Secondary | ICD-10-CM | POA: Diagnosis not present

## 2015-06-13 DIAGNOSIS — I10 Essential (primary) hypertension: Secondary | ICD-10-CM

## 2015-06-13 DIAGNOSIS — N400 Enlarged prostate without lower urinary tract symptoms: Secondary | ICD-10-CM | POA: Diagnosis not present

## 2015-06-13 DIAGNOSIS — E781 Pure hyperglyceridemia: Secondary | ICD-10-CM

## 2015-06-13 DIAGNOSIS — D2362 Other benign neoplasm of skin of left upper limb, including shoulder: Secondary | ICD-10-CM | POA: Diagnosis not present

## 2015-06-13 DIAGNOSIS — R739 Hyperglycemia, unspecified: Secondary | ICD-10-CM

## 2015-06-13 DIAGNOSIS — L570 Actinic keratosis: Secondary | ICD-10-CM | POA: Diagnosis not present

## 2015-06-13 DIAGNOSIS — L821 Other seborrheic keratosis: Secondary | ICD-10-CM | POA: Diagnosis not present

## 2015-06-13 LAB — CBC WITH DIFFERENTIAL/PLATELET
BASOS ABS: 0 10*3/uL (ref 0.0–0.1)
Basophils Relative: 0.6 % (ref 0.0–3.0)
Eosinophils Absolute: 0.2 10*3/uL (ref 0.0–0.7)
Eosinophils Relative: 2.7 % (ref 0.0–5.0)
HCT: 39.4 % (ref 39.0–52.0)
HEMOGLOBIN: 13.7 g/dL (ref 13.0–17.0)
LYMPHS ABS: 2 10*3/uL (ref 0.7–4.0)
Lymphocytes Relative: 28.8 % (ref 12.0–46.0)
MCHC: 34.8 g/dL (ref 30.0–36.0)
MCV: 95 fl (ref 78.0–100.0)
MONO ABS: 1.1 10*3/uL — AB (ref 0.1–1.0)
Monocytes Relative: 16.3 % — ABNORMAL HIGH (ref 3.0–12.0)
NEUTROS PCT: 51.6 % (ref 43.0–77.0)
Neutro Abs: 3.5 10*3/uL (ref 1.4–7.7)
Platelets: 240 10*3/uL (ref 150.0–400.0)
RBC: 4.15 Mil/uL — AB (ref 4.22–5.81)
RDW: 12.8 % (ref 11.5–15.5)
WBC: 6.8 10*3/uL (ref 4.0–10.5)

## 2015-06-13 LAB — URINALYSIS, ROUTINE W REFLEX MICROSCOPIC
BILIRUBIN URINE: NEGATIVE
Hgb urine dipstick: NEGATIVE
KETONES UR: NEGATIVE
LEUKOCYTES UA: NEGATIVE
NITRITE: NEGATIVE
Specific Gravity, Urine: 1.015 (ref 1.000–1.030)
Total Protein, Urine: NEGATIVE
UROBILINOGEN UA: 0.2 (ref 0.0–1.0)
Urine Glucose: NEGATIVE
pH: 6 (ref 5.0–8.0)

## 2015-06-13 LAB — COMPREHENSIVE METABOLIC PANEL
ALK PHOS: 42 U/L (ref 39–117)
ALT: 43 U/L (ref 0–53)
AST: 25 U/L (ref 0–37)
Albumin: 4 g/dL (ref 3.5–5.2)
BILIRUBIN TOTAL: 0.6 mg/dL (ref 0.2–1.2)
BUN: 30 mg/dL — AB (ref 6–23)
CO2: 29 mEq/L (ref 19–32)
Calcium: 9.1 mg/dL (ref 8.4–10.5)
Chloride: 104 mEq/L (ref 96–112)
Creatinine, Ser: 1.5 mg/dL (ref 0.40–1.50)
GFR: 48.85 mL/min — ABNORMAL LOW (ref >60.00)
Glucose, Bld: 108 mg/dL — ABNORMAL HIGH (ref 70–99)
Potassium: 4.7 mEq/L (ref 3.5–5.1)
Sodium: 138 mEq/L (ref 135–145)
TOTAL PROTEIN: 6.9 g/dL (ref 6.0–8.3)

## 2015-06-13 LAB — TSH: TSH: 3.73 u[IU]/mL (ref 0.35–4.50)

## 2015-06-13 LAB — LIPID PANEL
CHOLESTEROL: 185 mg/dL (ref 0–200)
HDL: 31.4 mg/dL — ABNORMAL LOW (ref >39.00)
NonHDL: 153.51
TRIGLYCERIDES: 349 mg/dL — AB (ref 0.0–149.0)
Total CHOL/HDL Ratio: 6
VLDL: 69.8 mg/dL — ABNORMAL HIGH (ref 0.0–40.0)

## 2015-06-13 LAB — LDL CHOLESTEROL, DIRECT: LDL DIRECT: 60 mg/dL

## 2015-06-13 LAB — PSA: PSA: 2.73 ng/mL (ref 0.10–4.00)

## 2015-06-13 LAB — HEMOGLOBIN A1C: Hgb A1c MFr Bld: 5.2 % (ref 4.6–6.5)

## 2015-06-16 DIAGNOSIS — G4730 Sleep apnea, unspecified: Secondary | ICD-10-CM | POA: Insufficient documentation

## 2015-06-16 MED ORDER — CITALOPRAM HYDROBROMIDE 20 MG PO TABS: ORAL_TABLET | ORAL | Status: DC

## 2015-06-16 MED ORDER — ATORVASTATIN CALCIUM 40 MG PO TABS: 40.0000 mg | ORAL_TABLET | Freq: Every day | ORAL | Status: DC

## 2015-06-16 NOTE — Assessment & Plan Note (Signed)

## 2015-07-01 ENCOUNTER — Other Ambulatory Visit: Payer: Self-pay

## 2015-07-01 MED ORDER — LOSARTAN POTASSIUM-HCTZ 100-12.5 MG PO TABS: 1.0000 | ORAL_TABLET | Freq: Every day | ORAL | Status: DC

## 2015-07-31 ENCOUNTER — Ambulatory Visit (INDEPENDENT_AMBULATORY_CARE_PROVIDER_SITE_OTHER): Payer: Medicare Other | Admitting: Pulmonary Disease

## 2015-07-31 VITALS — BP 128/72 | HR 76 | Ht 74.0 in | Wt 242.0 lb

## 2015-07-31 DIAGNOSIS — G4710 Hypersomnia, unspecified: Secondary | ICD-10-CM | POA: Diagnosis not present

## 2015-07-31 DIAGNOSIS — G4752 REM sleep behavior disorder: Secondary | ICD-10-CM | POA: Diagnosis not present

## 2015-07-31 NOTE — Progress Notes (Signed)
Subjective:    Patient ID: Daniel Howe, male    DOB: 02-21-1941, 75 y.o.   MRN: [id number]  HPI   This is the case of Daniel Howe, 75 y.o. Male, who was referred by Dr. Scarlette Calico in consultation regarding possible OSA.   As you very well know, patient is a non smoker, not known to have asthma or copd.  Patient is here for evaluation of possible sleep apnea. Patient gets adequate sleep at 7 hours at least. Denies any history of problem falling and staying asleep. Likely will wake up once or twice in the night to urinate. Wife has noticed patient acting out his dreams, roughly once or twice a week the last 6 months. This would happen around 2 or 3 in the morning. Wife would wake him up and he would remember acting out his dreams. Sometimes, he would appear to be chasing someone or hitting someone and when wife wakes him up, he would remember about that dream. Denies any sleep walking or talking. He has hurt himself maybe once within the last year when he hit the table. He has not hurt his wife. Denies sleep talking. He drinks wine every night. Every now and then, he drinks a little more liquor than usual. During that time, abnormal behavior is more apparent. Has snoring. Occasional gasping. No choking. Gets sleepy in the afternoon. He would usually fall asleep, watching TV, couple days in a week. Denies memory issues per wife. Although both of them have mild forgetfulness. Hypersomnia affects fxnality.   ESS 10       Review of Systems  Constitutional: Negative.  Negative for fever and unexpected weight change.  HENT: Negative.  Negative for congestion, dental problem, ear pain, nosebleeds, postnasal drip, rhinorrhea, sinus pressure, sneezing, sore throat and trouble swallowing.   Eyes: Negative.  Negative for redness and itching.  Respiratory: Negative.  Negative for cough, chest tightness, shortness of breath and wheezing.   Cardiovascular: Negative.  Negative for  palpitations and leg swelling.  Gastrointestinal: Negative.  Negative for nausea and vomiting.  Endocrine: Negative.   Genitourinary: Negative.  Negative for dysuria.  Musculoskeletal: Positive for arthralgias. Negative for joint swelling.  Skin: Negative.  Negative for rash.  Allergic/Immunologic: Negative.   Neurological: Negative.  Negative for headaches.  Hematological: Negative.  Does not bruise/bleed easily.  Psychiatric/Behavioral: Negative.  Negative for dysphoric mood. The patient is not nervous/anxious.   All other systems reviewed and are negative.  Past Medical History  Diagnosis Date  . Depression   . Hypertension   . Anxiety    (-)  CA, DVT.  Family History  Problem Relation Age of Onset  . Cancer Neg Hx   . Early death Neg Hx   . Hyperlipidemia Neg Hx   . Hypertension Neg Hx   . Diabetes Brother   . Heart disease Brother    (-) memory/dementia in family.   Past Surgical History  Procedure Laterality Date  . Cataract extraction    . Corneal transplant    . Inguinal hernia repair  03/09/2012    Procedure: HERNIA REPAIR INGUINAL ADULT;  Surgeon: Pedro Earls, MD;  Location: [facility];  Service: General;  Laterality: Left;  . Hernia repair  03/09/12    lih repair    Social History   Social History  . Marital Status: Married    Spouse Name: N/A  . Number of Children: 2  . Years of Education: N/A   Occupational History  .  Retired     Press photographer   Social History Main Topics  . Smoking status: Never Smoker   . Smokeless tobacco: Never Used  . Alcohol Use: 8.4 oz/week    14 Glasses of wine per week  . Drug Use: No  . Sexual Activity: Yes   Other Topics Concern  . Not on file   Social History Narrative   Married, lives in [place], used to be in [employer]. Drinks wine nightly.   No Known Allergies   Outpatient Prescriptions Prior to Visit  Medication Sig Dispense Refill  . aspirin 81 MG tablet Take 81 mg by mouth daily.    Marland Kitchen  atorvastatin (LIPITOR) 40 MG tablet Take 1 tablet (40 mg total) by mouth daily. 90 tablet 3  . citalopram (CELEXA) 20 MG tablet take 1 tablet by mouth once daily 90 tablet 3  . losartan-hydrochlorothiazide (HYZAAR) 100-12.5 MG tablet Take 1 tablet by mouth daily. for high blood pressure 90 tablet 3  . metoprolol (LOPRESSOR) 100 MG tablet take 1 tablet by mouth twice a day 180 tablet 3   No facility-administered medications prior to visit.   No orders of the defined types were placed in this encounter.         Objective:   Physical Exam   Vitals:  Filed Vitals:   07/31/15 1345  BP: 128/72  Pulse: 76  Height: 6\' 2"  (1.88 m)  Weight: 242 lb (109.77 kg)  SpO2: 98%    Constitutional/General:  Pleasant, well-nourished, well-developed, not in any distress,  Comfortably seating.  Well kempt  Body mass index is 31.06 kg/(m^2). Wt Readings from Last 3 Encounters:  07/31/15 242 lb (109.77 kg)  06/12/15 244 lb (110.678 kg)  03/15/14 237 lb (107.502 kg)    Neck circumference: 17 inches.   HEENT: Pupils equal and reactive to light and accommodation. Anicteric sclerae. Normal nasal mucosa.   No oral  lesions,  mouth clear,  oropharynx clear, no postnasal drip. (-) Oral thrush. No dental caries.  Airway - Mallampati class III  Neck: No masses. Midline trachea. No JVD, (-) LAD. (-) bruits appreciated.  Respiratory/Chest: Grossly normal chest. (-) deformity. (-) Accessory muscle use.  Symmetric expansion. (-) Tenderness on palpation.  Resonant on percussion.  Diminished BS on both lower lung zones. (-) wheezing, crackles, rhonchi (-) egophony  Cardiovascular: Regular rate and  rhythm, heart sounds normal, no murmur or gallops, no peripheral edema  Gastrointestinal:  Normal bowel sounds. Soft, non-tender. No hepatosplenomegaly.  (-) masses.   Musculoskeletal:  Normal muscle tone. Normal gait.   Extremities: Grossly normal. (-) clubbing, cyanosis.  (-) edema  Skin: (-)  rash,lesions seen.   Neurological/Psychiatric : alert, oriented to time, place, person. Normal mood and affect           Assessment & Plan:  Hypersomnia Pt with hypersomnia, snoring, fatigue.  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 Has not hurt himself or wife significantly. ESS 10. Neck circ is 17 inches. RBD more frequently. Worse with ETOH. Plan : 1. Ideally inlab study but cant be done soon. Will opt for a home sleep study for now. 2. Try autocpap and needs strict 1 month DL. 3. If DL is not good or pt still with persistent RBD, plan for in lab study. 4. 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 Has not hurt himself majorly or wife.  Plan: 1. Rx osa 2. Avoid sleep deprivation 3.  Avoid etoh 4. D/w pt and wife re: bedroom safety. No sharp objects. Avoid 2nd floor. May need to place mattress on board. Lock doors, etc.  5. May need meds if rbd not controlled with cpap. 6. Pt and wife to let us know if rbd is uncontrolled or getting worse    Thank you very much for letting me participate in this patient's care. Please do not hesitate to give me a call if you have any questions or concerns regarding the treatment plan.   Patient will follow up with me in 6 weeks.     Monica Becton, MD 08/01/2015   2:39 AM Pulmonary and [place] Pager: [phone] Office: [phone], Fax: [phone]

## 2015-07-31 NOTE — Patient Instructions (Signed)
1. We will schedule you for a home sleep study. He should hear back from [place] 1-2 weeks after the study. 2. Alcohol in moderation. 3. Avoid sleep deprivation. 4. Room safety as we have discussed. 5. Call the office if having more abnormal behavior in sleep.  Return to clinic in 6 weeks

## 2015-08-01 ENCOUNTER — Encounter: Payer: Self-pay | Admitting: Pulmonary Disease

## 2015-08-01 NOTE — Assessment & Plan Note (Signed)
Pt with acting out of dreams. 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 Has not hurt himself majorly or wife.  Plan: 1. Rx osa 2. Avoid sleep deprivation 3. Avoid etoh 4. D/w pt and wife re: bedroom safety. No sharp objects. Avoid 2nd floor. May need to place mattress on board. Lock doors, etc.  5. May need meds if rbd not controlled with cpap. 6. Pt and wife to let us know if rbd is uncontrolled or getting worse

## 2015-08-01 NOTE — Assessment & Plan Note (Signed)
Pt with hypersomnia, snoring, fatigue.  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 Has not hurt himself or wife significantly. ESS 10. Neck circ is 17 inches. RBD more frequently. Worse with ETOH. Plan : 1. Ideally inlab study but cant be done soon. Will opt for a home sleep study for now. 2. Try autocpap and needs strict 1 month DL. 3. If DL is not good or pt still with persistent RBD, plan for in lab study. 4. Wean off etoh or use in moderation

## 2015-08-19 DIAGNOSIS — G4733 Obstructive sleep apnea (adult) (pediatric): Secondary | ICD-10-CM | POA: Diagnosis not present

## 2015-08-26 ENCOUNTER — Telehealth: Payer: Self-pay | Admitting: Pulmonary Disease

## 2015-08-26 DIAGNOSIS — G4733 Obstructive sleep apnea (adult) (pediatric): Secondary | ICD-10-CM | POA: Diagnosis not present

## 2015-08-26 NOTE — Telephone Encounter (Signed)
  Please call the pt and tell the pt the [place]  showed OSA   Pt stops breathing  20  times an hour.   Please order autoCPAP 5-15 cm H2O. Patient will need a mask fitting session. Patient will need a 1 month download.   Patient needs to be seen 4-6 weeks after obtaining the cpap machine. Let me know if you receive this.   Thanks!   J. Shirl Harris, MD 08/26/2015, 3:29 PM

## 2015-08-27 ENCOUNTER — Other Ambulatory Visit: Payer: Self-pay | Admitting: *Deleted

## 2015-08-27 DIAGNOSIS — G4710 Hypersomnia, unspecified: Secondary | ICD-10-CM

## 2015-08-27 DIAGNOSIS — G4752 REM sleep behavior disorder: Secondary | ICD-10-CM

## 2015-08-27 NOTE — Telephone Encounter (Signed)
Patient called back returning our call - checking on sleep study results - Thanks -prm

## 2015-08-27 NOTE — Telephone Encounter (Signed)
Called spoke with pt. Reviewed results and recs. Pt voiced understanding and had no further questions. I canceled ov for 09/12/15 and explained to him to call our office back once he received the CPAP to schedule the ov. He voiced understanding and had no further questions. Order has been placed. Nothing further needed.

## 2015-08-27 NOTE — Telephone Encounter (Signed)
LMTCB

## 2015-08-30 DIAGNOSIS — G4733 Obstructive sleep apnea (adult) (pediatric): Secondary | ICD-10-CM | POA: Diagnosis not present

## 2015-09-10 ENCOUNTER — Telehealth: Payer: Self-pay | Admitting: Pulmonary Disease

## 2015-09-10 DIAGNOSIS — G4730 Sleep apnea, unspecified: Secondary | ICD-10-CM

## 2015-09-10 NOTE — Telephone Encounter (Signed)
pls tell the pt we will decrease the pressure to cpap 5-10.  pls call dme and tell them to cut down pressure.  Tnx.  AD

## 2015-09-10 NOTE — Telephone Encounter (Signed)
Attempted to contact pt. No answer, no option to leave a message. Will try back.  

## 2015-09-10 NOTE — Telephone Encounter (Signed)
Spoke with pt. States that his CPAP pressure is to high. This was increased yesterday per the pt. Feels like he is getting to much air. His current setting is at 5-15cm auto.  AD - please advise. Thanks.

## 2015-09-10 NOTE — Telephone Encounter (Signed)
[phone], pt cb

## 2015-09-11 NOTE — Telephone Encounter (Signed)
Spoke with pt and advised of CPAP pressure change ok by AD. Order sent. Pt advised to call office if pressure still too high. Nothing further needed.

## 2015-09-12 ENCOUNTER — Ambulatory Visit: Payer: Self-pay | Admitting: Pulmonary Disease

## 2015-09-12 ENCOUNTER — Telehealth: Payer: Self-pay | Admitting: Pulmonary Disease

## 2015-09-12 DIAGNOSIS — G4733 Obstructive sleep apnea (adult) (pediatric): Secondary | ICD-10-CM | POA: Diagnosis not present

## 2015-09-12 NOTE — Telephone Encounter (Signed)
Pt calling back [phone]

## 2015-09-12 NOTE — Telephone Encounter (Signed)
   pls call pt and ask if he is doingOK with cpap. Not sure if he is having a lot of leak with cpap as his ahi is high.   If he is doing well, plan to get a 1 month DL from 08/30/15 and need to be seen in June.  Otherwise, let me know what issue is. Thanks.   AD

## 2015-09-12 NOTE — Telephone Encounter (Signed)
Called spoke with pt. Pt states that last night was a huge improvement with is CPAP. I explained to the patient that we would keep the ov for 10/08/15. He voiced understanding and had no further questions. Nothing further needed.

## 2015-09-12 NOTE — Telephone Encounter (Signed)
LMTCB

## 2015-10-02 ENCOUNTER — Other Ambulatory Visit: Payer: Self-pay | Admitting: *Deleted

## 2015-10-02 MED ORDER — METOPROLOL TARTRATE 100 MG PO TABS: 100.0000 mg | ORAL_TABLET | Freq: Two times a day (BID) | ORAL | Status: DC

## 2015-10-08 ENCOUNTER — Ambulatory Visit (INDEPENDENT_AMBULATORY_CARE_PROVIDER_SITE_OTHER): Payer: Medicare Other | Admitting: Pulmonary Disease

## 2015-10-08 ENCOUNTER — Encounter: Payer: Self-pay | Admitting: Pulmonary Disease

## 2015-10-08 VITALS — BP 142/72 | HR 65 | Ht 74.0 in | Wt 247.0 lb

## 2015-10-08 DIAGNOSIS — G4730 Sleep apnea, unspecified: Secondary | ICD-10-CM

## 2015-10-08 DIAGNOSIS — G4733 Obstructive sleep apnea (adult) (pediatric): Secondary | ICD-10-CM | POA: Diagnosis not present

## 2015-10-08 DIAGNOSIS — G4752 REM sleep behavior disorder: Secondary | ICD-10-CM

## 2015-10-08 NOTE — Patient Instructions (Signed)
   1. Continue using your CPAP. 2. We will try ti get you a different mask and a chin strap and see if this will be better.  Return to clinic in 6 months.

## 2015-10-08 NOTE — Progress Notes (Signed)
Subjective:    Patient ID: Daniel Howe, male    DOB: 08-May-1940, 75 y.o.   MRN: [id number]  HPI   This is the case of Daniel Howe, 75 y.o. Male, who is being seen for OSA As you very well know, patient is a non smoker, not known to have asthma or copd.  Patient is here for evaluation of possible sleep apnea. Patient gets adequate sleep at 7 hours at least. Denies any history of problem falling and staying asleep. Likely will wake up once or twice in the night to urinate. Wife has noticed patient acting out his dreams, roughly once or twice a week the last 6 months. This would happen around 2 or 3 in the morning. Wife would wake him up and he would remember acting out his dreams. Sometimes, he would appear to be chasing someone or hitting someone and when wife wakes him up, he would remember about that dream. Denies any sleep walking or talking. He has hurt himself maybe once within the last year when he hit the table. He has not hurt his wife. Denies sleep talking. He drinks wine every night. Every now and then, he drinks a little more liquor than usual. During that time, abnormal behavior is more apparent. Has snoring. Occasional gasping. No choking. Gets sleepy in the afternoon. He would usually fall asleep, watching TV, couple days in a week. Denies memory issues per wife. Although both of them have mild forgetfulness. Hypersomnia affects fxnality.   ESS 10  DATA HST (08/2015) AHI 20. autocpap 5-15.   ROV (10/08/15)  Pt started on autocpap. 90% compliance. AHI 16 first 45mos.  Did not have issues intially but somehow pressures were increased and pt could not tolerate. Switched to autocpap 5-10 and he is tolerating it. Feels benefit of cpap. More energy. Less sleepiness. Still with vivid dreams but (-) recent RBD.    Review of Systems  Constitutional: Negative.  Negative for fever and unexpected weight change.  HENT: Negative.  Negative for congestion, dental problem, ear pain,  nosebleeds, postnasal drip, rhinorrhea, sinus pressure, sneezing, sore throat and trouble swallowing.   Eyes: Negative.  Negative for redness and itching.  Respiratory: Negative.  Negative for cough, chest tightness, shortness of breath and wheezing.   Cardiovascular: Negative.  Negative for palpitations and leg swelling.  Gastrointestinal: Negative.  Negative for nausea and vomiting.  Endocrine: Negative.   Genitourinary: Negative.  Negative for dysuria.  Musculoskeletal: Positive for arthralgias. Negative for joint swelling.  Skin: Negative.  Negative for rash.  Allergic/Immunologic: Negative.   Neurological: Negative.  Negative for headaches.  Hematological: Negative.  Does not bruise/bleed easily.  Psychiatric/Behavioral: Negative.  Negative for dysphoric mood. The patient is not nervous/anxious.   All other systems reviewed and are negative.      Objective:   Physical Exam   Vitals:  Filed Vitals:   10/08/15 0900  BP: 142/72  Pulse: 65  Height: 6\' 2"  (1.88 m)  Weight: 247 lb (112.038 kg)  SpO2: 97%    Constitutional/General:  Pleasant, well-nourished, well-developed, not in any distress,  Comfortably seating.  Well kempt  Body mass index is 31.7 kg/(m^2). Wt Readings from Last 3 Encounters:  10/08/15 247 lb (112.038 kg)  07/31/15 242 lb (109.77 kg)  06/12/15 244 lb (110.678 kg)    Neck circumference: 17 inches.   HEENT: Pupils equal and reactive to light and accommodation. Anicteric sclerae. Normal nasal mucosa.   No oral  lesions,  mouth clear,  oropharynx clear, no postnasal drip. (-) Oral thrush. No dental caries.  Airway - Mallampati class III  Neck: No masses. Midline trachea. No JVD, (-) LAD. (-) bruits appreciated.  Respiratory/Chest: Grossly normal chest. (-) deformity. (-) Accessory muscle use.  Symmetric expansion. (-) Tenderness on palpation.  Resonant on percussion.  Diminished BS on both lower lung zones. (-) wheezing, crackles, rhonchi (-)  egophony  Cardiovascular: Regular rate and  rhythm, heart sounds normal, no murmur or gallops, no peripheral edema  Gastrointestinal:  Normal bowel sounds. Soft, non-tender. No hepatosplenomegaly.  (-) masses.   Musculoskeletal:  Normal muscle tone. Normal gait.   Extremities: Grossly normal. (-) clubbing, cyanosis.  (-) edema  Skin: (-) rash,lesions seen.   Neurological/Psychiatric : alert, oriented to time, place, person. Normal mood and affect           Assessment & Plan:  Sleep apnea Pt with hypersomnia, snoring, fatigue.  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 Has not hurt himself or wife significantly. ESS 10. Neck circ is 17 inches. RBD more frequently. Worse with ETOH. HST (08/2015)  AHI 20. Started on autocpap 5-15 > pressure was too high. cpap changed to 5-10 and he is tolerating.  Feels benefit of cpap. More energy. Less sleepiness. Has nasal pillows.  Plan : 1. Cont autocpap 5-10.  2. Will try chin strap. Try FFM.  3. Needs 1 month DL. AHI was 16 first month. Feels better though.  May need to inc cpap pressure. Did not telerate 5-15.  4. Needs a DL for June/ 1 month.    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 Has not hurt himself majorly or wife.  Less acting out since on cpap.  Plan: 1. Rx osa 2. Avoid sleep deprivation 3. Avoid etoh 4. D/w pt and wife re: bedroom safety. No sharp objects. Avoid 2nd floor. May need to place mattress on board. Lock doors, etc.  5. Needs good compliance and DL. Hold off on meds for now.  5. May need meds if rbd not controlled with cpap. 6. Pt and wife to let us know if rbd is uncontrolled or getting worse      Patient will follow up with me in 6 months    J. Shirl Harris, MD 10/08/2015   9:29 AM Pulmonary and [facility] Pager: [phone] Office: [phone], Fax: [phone]

## 2015-10-08 NOTE — Assessment & Plan Note (Signed)
Pt with acting out of dreams. 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 Has not hurt himself majorly or wife.  Less acting out since on cpap.  Plan: 1. Rx osa 2. Avoid sleep deprivation 3. Avoid etoh 4. D/w pt and wife re: bedroom safety. No sharp objects. Avoid 2nd floor. May need to place mattress on board. Lock doors, etc.  5. Needs good compliance and DL. Hold off on meds for now.  5. May need meds if rbd not controlled with cpap. 6. Pt and wife to let us know if rbd is uncontrolled or getting worse

## 2015-10-08 NOTE — Assessment & Plan Note (Addendum)
Pt with hypersomnia, snoring, fatigue.  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 Has not hurt himself or wife significantly. ESS 10. Neck circ is 17 inches. RBD more frequently. Worse with ETOH. HST (08/2015)  AHI 20. Started on autocpap 5-15 > pressure was too high. cpap changed to 5-10 and he is tolerating.  Feels benefit of cpap. More energy. Less sleepiness. Has nasal pillows.  Plan : 1. Cont autocpap 5-10.  2. Will try chin strap. Try FFM.  3. Needs 1 month DL. AHI was 16 first month. Feels better though.  May need to inc cpap pressure. Did not telerate 5-15.  4. Needs a DL for June/ 1 month.

## 2015-10-09 DIAGNOSIS — G4733 Obstructive sleep apnea (adult) (pediatric): Secondary | ICD-10-CM | POA: Diagnosis not present

## 2015-10-28 ENCOUNTER — Encounter: Payer: Self-pay | Admitting: Pulmonary Disease

## 2015-10-30 DIAGNOSIS — G4733 Obstructive sleep apnea (adult) (pediatric): Secondary | ICD-10-CM | POA: Diagnosis not present

## 2015-11-07 ENCOUNTER — Telehealth: Payer: Self-pay | Admitting: Pulmonary Disease

## 2015-11-07 DIAGNOSIS — G4733 Obstructive sleep apnea (adult) (pediatric): Secondary | ICD-10-CM

## 2015-11-07 NOTE — Telephone Encounter (Signed)
  sheena -- can we get a 1 month DL for August?     DL for 1 month  (june) on cpap 5-10 was 97% and AHI 13.5. Was previously 16 AHI on autocpap 5-15.  Did not tolerate high pressure.  Will get DL for aug and see if AHI improves. If not, will need a cpap study.   AD

## 2015-11-12 NOTE — Telephone Encounter (Signed)
Pt is aware of the below information. Order has been placed for download in August. Nothing further was needed.

## 2015-11-14 ENCOUNTER — Encounter: Payer: Self-pay | Admitting: Pulmonary Disease

## 2015-11-29 DIAGNOSIS — G4733 Obstructive sleep apnea (adult) (pediatric): Secondary | ICD-10-CM | POA: Diagnosis not present

## 2015-12-20 ENCOUNTER — Encounter: Payer: Self-pay | Admitting: Pulmonary Disease

## 2015-12-30 DIAGNOSIS — G4733 Obstructive sleep apnea (adult) (pediatric): Secondary | ICD-10-CM | POA: Diagnosis not present

## 2016-01-14 DIAGNOSIS — G4733 Obstructive sleep apnea (adult) (pediatric): Secondary | ICD-10-CM | POA: Diagnosis not present

## 2016-01-30 DIAGNOSIS — G4733 Obstructive sleep apnea (adult) (pediatric): Secondary | ICD-10-CM | POA: Diagnosis not present

## 2016-02-04 DIAGNOSIS — Z23 Encounter for immunization: Secondary | ICD-10-CM | POA: Diagnosis not present

## 2016-02-29 DIAGNOSIS — G4733 Obstructive sleep apnea (adult) (pediatric): Secondary | ICD-10-CM | POA: Diagnosis not present

## 2016-03-31 DIAGNOSIS — G4733 Obstructive sleep apnea (adult) (pediatric): Secondary | ICD-10-CM | POA: Diagnosis not present

## 2016-04-01 DIAGNOSIS — H6123 Impacted cerumen, bilateral: Secondary | ICD-10-CM | POA: Diagnosis not present

## 2016-04-06 ENCOUNTER — Encounter: Payer: Self-pay | Admitting: Pulmonary Disease

## 2016-04-08 ENCOUNTER — Encounter: Payer: Self-pay | Admitting: Pulmonary Disease

## 2016-04-08 ENCOUNTER — Ambulatory Visit (INDEPENDENT_AMBULATORY_CARE_PROVIDER_SITE_OTHER): Payer: Medicare Other | Admitting: Pulmonary Disease

## 2016-04-08 DIAGNOSIS — G4733 Obstructive sleep apnea (adult) (pediatric): Secondary | ICD-10-CM | POA: Diagnosis not present

## 2016-04-08 DIAGNOSIS — G4752 REM sleep behavior disorder: Secondary | ICD-10-CM | POA: Diagnosis not present

## 2016-04-08 NOTE — Assessment & Plan Note (Addendum)
Pt with hypersomnia, snoring, fatigue.  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 Has not hurt himself or wife significantly. ESS 10. Neck circ is 17 inches. RBD more frequently. Worse with ETOH. HST (08/2015)  AHI 20. Started on autocpap 5-15. pressure was too high. cpap changed to 5-10 cm water back in June but compliance showed AHI was elevated at 13. We changed him to auto CPAP 5-15 centimeters water. Download the last month (November): 80%, AHI 12. He has nasal pillows and chinstrap.   Feels better using cpap. More energy. Less sleepiness.  RBD is not as frequent.   Plan : We extensively discussed the importance of treating OSA and the need to use PAP therapy.   Continue with autocpap 5-15 cm water with nasal pillows and chin strap.  Patient feels better. He is able to drive to [place] w/o Being too sleepy. Feels benefit of CPAP. Plan to continue current treatment. Plan for download in March. AHI is trending down. If not better or worse in March, he will need a CPAP titration study. Also, told him to let us know if he has more RBD.   Patient was instructed to have mask, tubings, filter, reservoir cleaned at least once a week with soapy water.  Patient was instructed to call the office if he/she is having issues with the PAP device.    I advised patient to obtain sufficient amount of sleep --  7 to 8 hours at least in a 24 hr period.  Patient was advised to follow good sleep hygiene.  Patient was advised NOT to engage in activities requiring concentration and/or vigilance if he/she is and  sleepy.  Patient is NOT to drive if he/she is sleepy.

## 2016-04-08 NOTE — Addendum Note (Signed)
Addended by: Benson Setting L on: 04/08/2016 03:14 PM   Modules accepted: Orders

## 2016-04-08 NOTE — Assessment & Plan Note (Addendum)
Pt with acting out of dreams. 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 Has not hurt himself majorly or wife.  Less acting out since on cpap.  Plan: 1. Cont cpap.  2. Avoid sleep deprivation 3. Avoid etoh 4. D/w pt  re: bedroom safety. No sharp objects. Avoid 2nd floor. May need to place mattress on board. Lock doors, etc.  5. Needs good compliance and DL. Hold off on meds for now.  5. May need meds if rbd not controlled with cpap. 6. Pt and wife to let us know if rbd is uncontrolled or getting worse   

## 2016-04-08 NOTE — Patient Instructions (Signed)
   It was a pleasure taking care of you today!  Continue using your CPAP machine.   Please make sure you use your CPAP device everytime you sleep.  We will monitor the usage of your machine per your insurance requirement.  Your insurance company may take the machine from you if you are not using it regularly.   Please clean the mask, tubings, filter, water reservoir with soapy water every week.  Please use distilled water for the water reservoir.   Please call the office or your machine provider (DME company) if you are having issues with the device.   Return to clinic in 3 mos  with Dr. Corrie Dandy.

## 2016-04-08 NOTE — Progress Notes (Signed)
Subjective:    Patient ID: Daniel Howe, male    DOB: 31-Jan-1941, 75 y.o.   MRN: [id number]  HPI   This is the case of Daniel Howe, 75 y.o. Male, who is being seen for OSA As you very well know, patient is a non smoker, not known to have asthma or copd.  Patient is here for evaluation of possible sleep apnea. Patient gets adequate sleep at 7 hours at least. Denies any history of problem falling and staying asleep. Likely will wake up once or twice in the night to urinate. Wife has noticed patient acting out his dreams, roughly once or twice a week the last 6 months. This would happen around 2 or 3 in the morning. Wife would wake him up and he would remember acting out his dreams. Sometimes, he would appear to be chasing someone or hitting someone and when wife wakes him up, he would remember about that dream. Denies any sleep walking or talking. He has hurt himself maybe once within the last year when he hit the table. He has not hurt his wife. Denies sleep talking. He drinks wine every night. Every now and then, he drinks a little more liquor than usual. During that time, abnormal behavior is more apparent. Has snoring. Occasional gasping. No choking. Gets sleepy in the afternoon. He would usually fall asleep, watching TV, couple days in a week. Denies memory issues per wife. Although both of them have mild forgetfulness. Hypersomnia affects fxnality.   ESS 10  DATA HST (08/2015) AHI 20. autocpap 5-15.   ROV (10/08/15)  Pt started on autocpap. 90% compliance. AHI 16 first 14mos.  Did not have issues intially but somehow pressures were increased and pt could not tolerate. Switched to autocpap 5-10 and he is tolerating it. Feels benefit of cpap. More energy. Less sleepiness. Still with vivid dreams but (-) recent RBD.   ROV 04/08/16 Patient returns to the office as follow-up and sleep apnea. Since last seen, we did a download and his AHI was better from 16 tp 13. He started chinstrap.  Feels better using it. More energy. Less sleepiness.   Review of Systems  Constitutional: Negative.  Negative for fever and unexpected weight change.  HENT: Negative.  Negative for congestion, dental problem, ear pain, nosebleeds, postnasal drip, rhinorrhea, sinus pressure, sneezing, sore throat and trouble swallowing.   Eyes: Negative.  Negative for redness and itching.  Respiratory: Negative.  Negative for cough, chest tightness, shortness of breath and wheezing.   Cardiovascular: Negative.  Negative for palpitations and leg swelling.  Gastrointestinal: Negative.  Negative for nausea and vomiting.  Endocrine: Negative.   Genitourinary: Negative.  Negative for dysuria.  Musculoskeletal: Positive for arthralgias. Negative for joint swelling.  Skin: Negative.  Negative for rash.  Allergic/Immunologic: Negative.   Neurological: Negative.  Negative for headaches.  Hematological: Negative.  Does not bruise/bleed easily.  Psychiatric/Behavioral: Negative.  Negative for dysphoric mood. The patient is not nervous/anxious.   All other systems reviewed and are negative.      Objective:   Physical Exam   Vitals:  Vitals:   04/08/16 1333  BP: 126/74  Pulse: 64  SpO2: 97%  Weight: 252 lb (114.3 kg)  Height: 6\' 2"  (1.88 m)    Constitutional/General:  Pleasant, well-nourished, well-developed, not in any distress,  Comfortably seating.  Well kempt  Body mass index is 32.35 kg/m. Wt Readings from Last 3 Encounters:  04/08/16 252 lb (114.3 kg)  10/08/15 247 lb (112  kg)  07/31/15 242 lb (109.8 kg)    Neck circumference: 17 inches.   HEENT: Pupils equal and reactive to light and accommodation. Anicteric sclerae. Normal nasal mucosa.   No oral  lesions,  mouth clear,  oropharynx clear, no postnasal drip. (-) Oral thrush. No dental caries.  Airway - Mallampati class III  Neck: No masses. Midline trachea. No JVD, (-) LAD. (-) bruits appreciated.  Respiratory/Chest: Grossly normal  chest. (-) deformity. (-) Accessory muscle use.  Symmetric expansion. (-) Tenderness on palpation.  Resonant on percussion.  Diminished BS on both lower lung zones. (-) wheezing, crackles, rhonchi (-) egophony  Cardiovascular: Regular rate and  rhythm, heart sounds normal, no murmur or gallops, no peripheral edema  Gastrointestinal:  Normal bowel sounds. Soft, non-tender. No hepatosplenomegaly.  (-) masses.   Musculoskeletal:  Normal muscle tone. Normal gait.   Extremities: Grossly normal. (-) clubbing, cyanosis.  (-) edema  Skin: (-) rash,lesions seen.   Neurological/Psychiatric : alert, oriented to time, place, person. Normal mood and affect           Assessment & Plan:  OSA (obstructive sleep apnea) Pt with hypersomnia, snoring, fatigue.  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 Has not hurt himself or wife significantly. ESS 10. Neck circ is 17 inches. RBD more frequently. Worse with ETOH. HST (08/2015)  AHI 20. Started on autocpap 5-15. pressure was too high. cpap changed to 5-10 cm water back in June but compliance showed AHI was elevated at 13. We changed him to auto CPAP 5-15 centimeters water. Download the last month (November): 80%, AHI 12. He has nasal pillows and chinstrap.   Feels better using cpap. More energy. Less sleepiness.  RBD is not as frequent.   Plan : We extensively discussed the importance of treating OSA and the need to use PAP therapy.   Continue with autocpap 5-15 cm water with nasal pillows and chin strap.  Patient feels better. He is able to drive to [place] w/o Being too sleepy. Feels benefit of CPAP. Plan to continue current treatment. Plan for download in March. AHI is trending down. If not better or worse in March, he will need a CPAP titration study. Also, told him to let us know if he has more RBD.   Patient was instructed to have mask, tubings, filter, reservoir cleaned at least once a week with soapy water.  Patient was instructed to call the  office if he/she is having issues with the PAP device.    I advised patient to obtain sufficient amount of sleep --  7 to 8 hours at least in a 24 hr period.  Patient was advised to follow good sleep hygiene.  Patient was advised NOT to engage in activities requiring concentration and/or vigilance if he/she is and  sleepy.  Patient is NOT to drive if he/she is sleepy.     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 Has not hurt himself majorly or wife.  Less acting out since on cpap.  Plan: 1. Cont cpap.  2. Avoid sleep deprivation 3. Avoid etoh 4. D/w pt  re: bedroom safety. No sharp objects. Avoid 2nd floor. May need to place mattress on board. Lock doors, etc.  5. Needs good compliance and DL. Hold off on meds for now.  5. May need meds if rbd not controlled with cpap. 6. Pt and wife to let us know if rbd is uncontrolled or getting worse     Patient will follow up  with me in 3 months    J. Shirl Harris, MD 04/08/2016   2:02 PM Pulmonary and [place] Pager: [phone] Office: [phone], Fax: [phone]

## 2016-04-14 DIAGNOSIS — G4733 Obstructive sleep apnea (adult) (pediatric): Secondary | ICD-10-CM | POA: Diagnosis not present

## 2016-04-22 DIAGNOSIS — H2511 Age-related nuclear cataract, right eye: Secondary | ICD-10-CM | POA: Diagnosis not present

## 2016-04-22 DIAGNOSIS — H1851 Endothelial corneal dystrophy: Secondary | ICD-10-CM | POA: Diagnosis not present

## 2016-04-22 DIAGNOSIS — Z961 Presence of intraocular lens: Secondary | ICD-10-CM | POA: Diagnosis not present

## 2016-04-30 DIAGNOSIS — G4733 Obstructive sleep apnea (adult) (pediatric): Secondary | ICD-10-CM | POA: Diagnosis not present

## 2016-05-27 ENCOUNTER — Other Ambulatory Visit: Payer: Self-pay | Admitting: Internal Medicine

## 2016-05-27 DIAGNOSIS — F329 Major depressive disorder, single episode, unspecified: Secondary | ICD-10-CM

## 2016-05-27 DIAGNOSIS — F32A Depression, unspecified: Secondary | ICD-10-CM

## 2016-05-31 DIAGNOSIS — G4733 Obstructive sleep apnea (adult) (pediatric): Secondary | ICD-10-CM | POA: Diagnosis not present

## 2016-06-07 ENCOUNTER — Other Ambulatory Visit: Payer: Self-pay | Admitting: Internal Medicine

## 2016-06-07 DIAGNOSIS — E7800 Pure hypercholesterolemia, unspecified: Secondary | ICD-10-CM

## 2016-06-08 ENCOUNTER — Other Ambulatory Visit: Payer: Self-pay | Admitting: Internal Medicine

## 2016-06-08 DIAGNOSIS — E7800 Pure hypercholesterolemia, unspecified: Secondary | ICD-10-CM

## 2016-06-12 ENCOUNTER — Other Ambulatory Visit: Payer: Self-pay | Admitting: Internal Medicine

## 2016-06-12 NOTE — Telephone Encounter (Signed)
Please come in for your annual visit

## 2016-06-15 DIAGNOSIS — Z85828 Personal history of other malignant neoplasm of skin: Secondary | ICD-10-CM | POA: Diagnosis not present

## 2016-06-15 DIAGNOSIS — L570 Actinic keratosis: Secondary | ICD-10-CM | POA: Diagnosis not present

## 2016-06-15 DIAGNOSIS — D225 Melanocytic nevi of trunk: Secondary | ICD-10-CM | POA: Diagnosis not present

## 2016-06-15 DIAGNOSIS — D2262 Melanocytic nevi of left upper limb, including shoulder: Secondary | ICD-10-CM | POA: Diagnosis not present

## 2016-06-15 DIAGNOSIS — C44319 Basal cell carcinoma of skin of other parts of face: Secondary | ICD-10-CM | POA: Diagnosis not present

## 2016-06-15 DIAGNOSIS — D2362 Other benign neoplasm of skin of left upper limb, including shoulder: Secondary | ICD-10-CM | POA: Diagnosis not present

## 2016-06-15 DIAGNOSIS — D2261 Melanocytic nevi of right upper limb, including shoulder: Secondary | ICD-10-CM | POA: Diagnosis not present

## 2016-06-26 ENCOUNTER — Other Ambulatory Visit: Payer: Self-pay | Admitting: Internal Medicine

## 2016-06-26 NOTE — Telephone Encounter (Signed)
Please come in for your annual visit

## 2016-07-12 ENCOUNTER — Encounter: Payer: Self-pay | Admitting: Pulmonary Disease

## 2016-07-13 ENCOUNTER — Ambulatory Visit: Payer: Self-pay | Admitting: Pulmonary Disease

## 2016-07-14 ENCOUNTER — Ambulatory Visit (INDEPENDENT_AMBULATORY_CARE_PROVIDER_SITE_OTHER): Payer: Medicare Other | Admitting: Pulmonary Disease

## 2016-07-14 ENCOUNTER — Telehealth: Payer: Self-pay | Admitting: Pulmonary Disease

## 2016-07-14 ENCOUNTER — Encounter: Payer: Self-pay | Admitting: Pulmonary Disease

## 2016-07-14 DIAGNOSIS — G4752 REM sleep behavior disorder: Secondary | ICD-10-CM | POA: Diagnosis not present

## 2016-07-14 DIAGNOSIS — G4733 Obstructive sleep apnea (adult) (pediatric): Secondary | ICD-10-CM | POA: Diagnosis not present

## 2016-07-14 NOTE — Assessment & Plan Note (Signed)
Pt with acting out of dreams. 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 Has not hurt himself majorly or wife.  Less acting out since on cpap.  Plan: 1. Cont cpap.  2. Avoid sleep deprivation 3. Avoid etoh 4. D/w pt  re: bedroom safety. No sharp objects. Avoid 2nd floor. May need to place mattress on board. Lock doors, etc.  5. Needs good compliance and DL. Hold off on meds for now.  5. May need meds if rbd not controlled with cpap. 6. Pt and wife to let us know if rbd is uncontrolled or getting worse

## 2016-07-14 NOTE — Assessment & Plan Note (Addendum)
Pt with hypersomnia, snoring, fatigue.  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 Has not hurt himself or wife significantly. HST (08/2015)  AHI 20.  Started on autocpap 5-15 in 2017. Pressure was too high. cpap changed to 5-10 cm water back in June 2017 but compliance showed AHI was elevated at 13.   We changed him to auto CPAP 5-15 centimeters water.   Download November 2017: 80%, AHI 12. He has nasal pillows and chinstrap.   Download Feb 2018 : 87%, AHI 16.   Feels better using cpap. More energy. Less sleepiness.  RBD is not as frequent.   Plan : We extensively discussed the importance of treating OSA and the need to use PAP therapy.   Continue with autocpap 5-15 cm water with nasal pillows and chin strap.  Patient feels better.   I wanted to get a cpap titration study but pt wanted to hold off.  His nasal pillows have not been changed recently.  Plan to get a DL in April 2018 >> If AHI is still high, definitely, he will need a titration study.  I am not sure if he is having central apneas or it is just a leak issues (which DL shows).  His mean pressure during the DL was 9 cm water.     Patient was instructed to have mask, tubings, filter, reservoir cleaned at least once a week with soapy water.  Patient was instructed to call the office if he/she is having issues with the PAP device.    I advised patient to obtain sufficient amount of sleep --  7 to 8 hours at least in a 24 hr period.  Patient was advised to follow good sleep hygiene.  Patient was advised NOT to engage in activities requiring concentration and/or vigilance if he/she is and  sleepy.  Patient is NOT to drive if he/she is sleepy.

## 2016-07-14 NOTE — Telephone Encounter (Signed)
   [name] : Pt needs a cpap DL for 1 month in 08/2016. Can you pls order?   If AHI is higher than this month's, he will need a cpap titration study.   Thanks!  J. Shirl Harris, MD 07/14/2016, 10:43 PM [facility] Pager [phone] After 3 pm or if no answer, call [phone]

## 2016-07-14 NOTE — Patient Instructions (Signed)
  It was a pleasure taking care of you today!  Continue using your CPAP machine.   Please make sure you use your CPAP device everytime you sleep.  We will monitor the usage of your machine per your insurance requirement.  Your insurance company may take the machine from you if you are not using it regularly.   Please clean the mask, tubings, filter, water reservoir with soapy water every week.  Please use distilled water for the water reservoir.   Please call the office or your machine provider (DME company) if you are having issues with the device.   Return to clinic in 6 mos with NP.

## 2016-07-14 NOTE — Progress Notes (Signed)
Subjective:    Patient ID: Daniel Howe, male    DOB: 02-18-1941, 76 y.o.   MRN: [id number]  HPI   This is the case of Daniel Howe, 76 y.o. Male, who is being seen for OSA As you very well know, patient is a non smoker, not known to have asthma or copd.  Patient is here for evaluation of possible sleep apnea. Patient gets adequate sleep at 7 hours at least. Denies any history of problem falling and staying asleep. Likely will wake up once or twice in the night to urinate. Wife has noticed patient acting out his dreams, roughly once or twice a week the last 6 months. This would happen around 2 or 3 in the morning. Wife would wake him up and he would remember acting out his dreams. Sometimes, he would appear to be chasing someone or hitting someone and when wife wakes him up, he would remember about that dream. Denies any sleep walking or talking. He has hurt himself maybe once within the last year when he hit the table. He has not hurt his wife. Denies sleep talking. He drinks wine every night. Every now and then, he drinks a little more liquor than usual. During that time, abnormal behavior is more apparent. Has snoring. Occasional gasping. No choking. Gets sleepy in the afternoon. He would usually fall asleep, watching TV, couple days in a week. Denies memory issues per wife. Although both of them have mild forgetfulness. Hypersomnia affects fxnality.   ESS 10  DATA HST (08/2015) AHI 20. autocpap 5-15.   ROV (10/08/15)  Pt started on autocpap. 90% compliance. AHI 16 first 75mos.  Did not have issues intially but somehow pressures were increased and pt could not tolerate. Switched to autocpap 5-10 and he is tolerating it. Feels benefit of cpap. More energy. Less sleepiness. Still with vivid dreams but (-) recent RBD.   ROV 04/08/16 Patient returns to the office as follow-up and sleep apnea. Since last seen, we did a download and his AHI was better from 16 tp 13. He started chinstrap.  Feels better using it. More energy. Less sleepiness.  ROV 07/14/16 Patient returns to office as f/u on his osa.  Since last seen, he states OSA is doing well. Feels better using cpap. More energy, less sleepiness. He has nasal pillows which he has not changed recently.  DL the last month : 87%, AHI still elevated at 16.   Review of Systems  Constitutional: Negative.  Negative for fever and unexpected weight change.  HENT: Negative.  Negative for congestion, dental problem, ear pain, nosebleeds, postnasal drip, rhinorrhea, sinus pressure, sneezing, sore throat and trouble swallowing.   Eyes: Negative.  Negative for redness and itching.  Respiratory: Negative.  Negative for cough, chest tightness, shortness of breath and wheezing.   Cardiovascular: Negative.  Negative for palpitations and leg swelling.  Gastrointestinal: Negative.  Negative for nausea and vomiting.  Endocrine: Negative.   Genitourinary: Negative.  Negative for dysuria.  Musculoskeletal: Positive for arthralgias. Negative for joint swelling.  Skin: Negative.  Negative for rash.  Allergic/Immunologic: Negative.   Neurological: Negative.  Negative for headaches.  Hematological: Negative.  Does not bruise/bleed easily.  Psychiatric/Behavioral: Negative.  Negative for dysphoric mood. The patient is not nervous/anxious.   All other systems reviewed and are negative.      Objective:   Physical Exam   Vitals:  Vitals:   07/14/16 1543  BP: 122/76  Pulse: (!) 51  SpO2: 97%  Weight: 249 lb 3.2 oz (113 kg)  Height: 6\' 2"  (1.88 m)    Constitutional/General:  Pleasant, well-nourished, well-developed, not in any distress,  Comfortably seating.  Well kempt  Body mass index is 32 kg/m. Wt Readings from Last 3 Encounters:  07/14/16 249 lb 3.2 oz (113 kg)  04/08/16 252 lb (114.3 kg)  10/08/15 247 lb (112 kg)    Neck circumference: 17 inches.   HEENT: Pupils equal and reactive to light and accommodation. Anicteric sclerae.  Normal nasal mucosa.   No oral  lesions,  mouth clear,  oropharynx clear, no postnasal drip. (-) Oral thrush. No dental caries.  Airway - Mallampati class III  Neck: No masses. Midline trachea. No JVD, (-) LAD. (-) bruits appreciated.  Respiratory/Chest: Grossly normal chest. (-) deformity. (-) Accessory muscle use.  Symmetric expansion. (-) Tenderness on palpation.  Resonant on percussion.  Diminished BS on both lower lung zones. (-) wheezing, crackles, rhonchi (-) egophony  Cardiovascular: Regular rate and  rhythm, heart sounds normal, no murmur or gallops, no peripheral edema  Gastrointestinal:  Normal bowel sounds. Soft, non-tender. No hepatosplenomegaly.  (-) masses.   Musculoskeletal:  Normal muscle tone. Normal gait.   Extremities: Grossly normal. (-) clubbing, cyanosis.  (-) edema  Skin: (-) rash,lesions seen.   Neurological/Psychiatric : alert, oriented to time, place, person. Normal mood and affect           Assessment & Plan:  OSA (obstructive sleep apnea) Pt with hypersomnia, snoring, fatigue.  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 Has not hurt himself or wife significantly. HST (08/2015)  AHI 20.  Started on autocpap 5-15 in 2017. Pressure was too high. cpap changed to 5-10 cm water back in June 2017 but compliance showed AHI was elevated at 13.   We changed him to auto CPAP 5-15 centimeters water.   Download November 2017: 80%, AHI 12. He has nasal pillows and chinstrap.   Download Feb 2018 : 87%, AHI 16.   Feels better using cpap. More energy. Less sleepiness.  RBD is not as frequent.   Plan : We extensively discussed the importance of treating OSA and the need to use PAP therapy.   Continue with autocpap 5-15 cm water with nasal pillows and chin strap.  Patient feels better.   I wanted to get a cpap titration study but pt wanted to hold off.  His nasal pillows have not been changed recently.  Plan to get a DL in April 2018 >> If AHI is still high,  definitely, he will need a titration study.  I am not sure if he is having central apneas or it is just a leak issues (which DL shows).  His mean pressure during the DL was 9 cm water.     Patient was instructed to have mask, tubings, filter, reservoir cleaned at least once a week with soapy water.  Patient was instructed to call the office if he/she is having issues with the PAP device.    I advised patient to obtain sufficient amount of sleep --  7 to 8 hours at least in a 24 hr period.  Patient was advised to follow good sleep hygiene.  Patient was advised NOT to engage in activities requiring concentration and/or vigilance if he/she is and  sleepy.  Patient is NOT to drive if he/she is sleepy.     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 Has not hurt himself majorly or wife.  Less acting out since  on cpap.  Plan: 1. Cont cpap.  2. Avoid sleep deprivation 3. Avoid etoh 4. D/w pt  re: bedroom safety. No sharp objects. Avoid 2nd floor. May need to place mattress on board. Lock doors, etc.  5. Needs good compliance and DL. Hold off on meds for now.  5. May need meds if rbd not controlled with cpap. 6. Pt and wife to let us know if rbd is uncontrolled or getting worse     Patient will follow up in 6 months.     Monica Becton, MD 07/14/2016   10:40 PM [facility] [place] Pager: [phone] Office: [phone], Fax: [phone]

## 2016-07-15 NOTE — Telephone Encounter (Signed)
noted 

## 2016-07-20 DIAGNOSIS — G4733 Obstructive sleep apnea (adult) (pediatric): Secondary | ICD-10-CM | POA: Diagnosis not present

## 2016-07-22 ENCOUNTER — Other Ambulatory Visit: Payer: Self-pay | Admitting: Internal Medicine

## 2016-08-03 ENCOUNTER — Telehealth: Payer: Self-pay | Admitting: Internal Medicine

## 2016-08-03 MED ORDER — METOPROLOL TARTRATE 100 MG PO TABS: 100.0000 mg | ORAL_TABLET | Freq: Two times a day (BID) | ORAL | 0 refills | Status: DC

## 2016-08-03 MED ORDER — LOSARTAN POTASSIUM-HCTZ 100-12.5 MG PO TABS: 1.0000 | ORAL_TABLET | Freq: Every day | ORAL | 0 refills | Status: DC

## 2016-08-03 NOTE — Telephone Encounter (Signed)
Pt needs a refill on metoprolol (LOPRESSOR). Please advise.

## 2016-08-03 NOTE — Telephone Encounter (Signed)
erx sent for losartan and metoprolol

## 2016-08-11 ENCOUNTER — Ambulatory Visit (INDEPENDENT_AMBULATORY_CARE_PROVIDER_SITE_OTHER): Payer: Medicare Other | Admitting: Internal Medicine

## 2016-08-11 ENCOUNTER — Encounter: Payer: Self-pay | Admitting: Internal Medicine

## 2016-08-11 ENCOUNTER — Other Ambulatory Visit (INDEPENDENT_AMBULATORY_CARE_PROVIDER_SITE_OTHER): Payer: Medicare Other

## 2016-08-11 VITALS — BP 138/78 | HR 89 | Temp 98.0°F | Resp 16 | Ht 74.0 in | Wt 247.1 lb

## 2016-08-11 DIAGNOSIS — N183 Chronic kidney disease, stage 3 unspecified: Secondary | ICD-10-CM

## 2016-08-11 DIAGNOSIS — R001 Bradycardia, unspecified: Secondary | ICD-10-CM | POA: Diagnosis not present

## 2016-08-11 DIAGNOSIS — I10 Essential (primary) hypertension: Secondary | ICD-10-CM | POA: Diagnosis not present

## 2016-08-11 DIAGNOSIS — G4710 Hypersomnia, unspecified: Secondary | ICD-10-CM

## 2016-08-11 DIAGNOSIS — N400 Enlarged prostate without lower urinary tract symptoms: Secondary | ICD-10-CM

## 2016-08-11 DIAGNOSIS — E781 Pure hyperglyceridemia: Secondary | ICD-10-CM

## 2016-08-11 DIAGNOSIS — Z0000 Encounter for general adult medical examination without abnormal findings: Secondary | ICD-10-CM

## 2016-08-11 DIAGNOSIS — Z0001 Encounter for general adult medical examination with abnormal findings: Secondary | ICD-10-CM

## 2016-08-11 DIAGNOSIS — E7800 Pure hypercholesterolemia, unspecified: Secondary | ICD-10-CM

## 2016-08-11 DIAGNOSIS — R739 Hyperglycemia, unspecified: Secondary | ICD-10-CM

## 2016-08-11 LAB — URINALYSIS, ROUTINE W REFLEX MICROSCOPIC
BILIRUBIN URINE: NEGATIVE
HGB URINE DIPSTICK: NEGATIVE
KETONES UR: NEGATIVE
LEUKOCYTES UA: NEGATIVE
NITRITE: NEGATIVE
PH: 6 (ref 5.0–8.0)
RBC / HPF: NONE SEEN (ref 0–?)
Specific Gravity, Urine: 1.015 (ref 1.000–1.030)
TOTAL PROTEIN, URINE-UPE24: NEGATIVE
URINE GLUCOSE: NEGATIVE
UROBILINOGEN UA: 0.2 (ref 0.0–1.0)
WBC, UA: NONE SEEN (ref 0–?)

## 2016-08-11 LAB — PSA: PSA: 3.43 ng/mL (ref 0.10–4.00)

## 2016-08-11 LAB — TSH: TSH: 3.72 u[IU]/mL (ref 0.35–4.50)

## 2016-08-11 LAB — CBC WITH DIFFERENTIAL/PLATELET
Basophils Absolute: 0.1 10*3/uL (ref 0.0–0.1)
Basophils Relative: 0.6 % (ref 0.0–3.0)
Eosinophils Absolute: 0.2 10*3/uL (ref 0.0–0.7)
Eosinophils Relative: 2 % (ref 0.0–5.0)
HEMATOCRIT: 41.2 % (ref 39.0–52.0)
HEMOGLOBIN: 14.4 g/dL (ref 13.0–17.0)
LYMPHS PCT: 20.5 % (ref 12.0–46.0)
Lymphs Abs: 1.7 10*3/uL (ref 0.7–4.0)
MCHC: 35 g/dL (ref 30.0–36.0)
MCV: 96.6 fl (ref 78.0–100.0)
Monocytes Absolute: 1.5 10*3/uL — ABNORMAL HIGH (ref 0.1–1.0)
NEUTROS ABS: 4.9 10*3/uL (ref 1.4–7.7)
Neutrophils Relative %: 58.6 % (ref 43.0–77.0)
PLATELETS: 219 10*3/uL (ref 150.0–400.0)
RBC: 4.27 Mil/uL (ref 4.22–5.81)
RDW: 13.4 % (ref 11.5–15.5)
WBC: 8.3 10*3/uL (ref 4.0–10.5)

## 2016-08-11 LAB — COMPREHENSIVE METABOLIC PANEL
ALBUMIN: 4.2 g/dL (ref 3.5–5.2)
ALT: 53 U/L (ref 0–53)
AST: 27 U/L (ref 0–37)
Alkaline Phosphatase: 51 U/L (ref 39–117)
BUN: 35 mg/dL — ABNORMAL HIGH (ref 6–23)
CALCIUM: 9.1 mg/dL (ref 8.4–10.5)
CHLORIDE: 104 meq/L (ref 96–112)
CO2: 26 meq/L (ref 19–32)
CREATININE: 1.65 mg/dL — AB (ref 0.40–1.50)
GFR: 43.62 mL/min — AB (ref >60.00)
Glucose, Bld: 126 mg/dL — ABNORMAL HIGH (ref 70–99)
Potassium: 4.6 mEq/L (ref 3.5–5.1)
Sodium: 136 mEq/L (ref 135–145)
Total Bilirubin: 0.9 mg/dL (ref 0.2–1.2)
Total Protein: 7 g/dL (ref 6.0–8.3)

## 2016-08-11 LAB — LIPID PANEL
CHOL/HDL RATIO: 4
CHOLESTEROL: 110 mg/dL (ref 0–200)
HDL: 30.7 mg/dL — AB (ref >39.00)
LDL CALC: 42 mg/dL (ref 0–99)
NonHDL: 79.35
TRIGLYCERIDES: 186 mg/dL — AB (ref 0.0–149.0)
VLDL: 37.2 mg/dL (ref 0.0–40.0)

## 2016-08-11 LAB — HEMOGLOBIN A1C: Hgb A1c MFr Bld: 5.5 % (ref 4.6–6.5)

## 2016-08-11 NOTE — Progress Notes (Signed)
Subjective:  Patient ID: Daniel Howe, male    DOB: 08/23/1940  Age: 76 y.o. MRN: [id number]  CC: Hypertension; Hyperlipidemia; and Annual Exam   HPI Daniel Howe presents for a CPX.  He tells me his blood pressures been well controlled. He has been exercising and has had no episodes of palpitations, CPE, DOE, edema, fatigue, or dizziness.  Past Medical History:  Diagnosis Date  . Anxiety   . Depression   . Hypertension    Past Surgical History:  Procedure Laterality Date  . CATARACT EXTRACTION    . CORNEAL TRANSPLANT    . HERNIA REPAIR  03/09/12   lih repair  . INGUINAL HERNIA REPAIR  03/09/2012   Procedure: HERNIA REPAIR INGUINAL ADULT;  Surgeon: Pedro Earls, MD;  Location: [facility];  Service: General;  Laterality: Left;    reports that he has never smoked. He has never used smokeless tobacco. He reports that he drinks about 8.4 oz of alcohol per week . He reports that he does not use drugs. family history includes Diabetes in his brother; Heart disease in his brother. No Known Allergies  Outpatient Medications Prior to Visit  Medication Sig Dispense Refill  . aspirin 81 MG tablet Take 81 mg by mouth daily.    Marland Kitchen atorvastatin (LIPITOR) 40 MG tablet take 1 tablet by mouth once daily 90 tablet 3  . citalopram (CELEXA) 20 MG tablet take 1 tablet by mouth once daily 90 tablet 3  . metoprolol (LOPRESSOR) 100 MG tablet Take 1 tablet (100 mg total) by mouth 2 (two) times daily. 60 tablet 0  . losartan-hydrochlorothiazide (HYZAAR) 100-12.5 MG tablet Take 1 tablet by mouth daily. for high blood pressure 30 tablet 0   No facility-administered medications prior to visit.     ROS Review of Systems  Constitutional: Negative.  Negative for diaphoresis, fatigue and unexpected weight change.  HENT: Negative.   Eyes: Negative for visual disturbance.  Respiratory: Negative.  Negative for apnea, cough, chest tightness, shortness of breath and wheezing.   Cardiovascular:  Negative.  Negative for chest pain, palpitations and leg swelling.  Gastrointestinal: Negative for abdominal pain, constipation, diarrhea, nausea and vomiting.  Endocrine: Negative.   Genitourinary: Negative.  Negative for difficulty urinating, dysuria, penile swelling, scrotal swelling, testicular pain and urgency.  Musculoskeletal: Negative.  Negative for back pain, myalgias and neck pain.  Skin: Negative.   Allergic/Immunologic: Negative.   Neurological: Negative.   Hematological: Negative.  Negative for adenopathy. Does not bruise/bleed easily.  Psychiatric/Behavioral: Negative.     Objective:  BP 138/78 (BP Location: Left Arm, Patient Position: Sitting, Cuff Size: Large)   Pulse 89   Temp 98 F (36.7 C) (Oral)   Ht 6\' 2"  (1.88 m)   Wt 247 lb 2 oz (112.1 kg)   SpO2 97%   BMI 31.73 kg/m   BP Readings from Last 3 Encounters:  08/11/16 138/78  07/14/16 122/76  04/08/16 126/74    Wt Readings from Last 3 Encounters:  08/11/16 247 lb 2 oz (112.1 kg)  07/14/16 249 lb 3.2 oz (113 kg)  04/08/16 252 lb (114.3 kg)    Physical Exam  Constitutional: No distress.  HENT:  Mouth/Throat: Oropharynx is clear and moist. No oropharyngeal exudate.  Eyes: Conjunctivae are normal. Right eye exhibits no discharge. Left eye exhibits no discharge. No scleral icterus.  Neck: Normal range of motion. Neck supple. No JVD present. No tracheal deviation present. No thyromegaly present.  Cardiovascular: Normal rate, regular rhythm, normal  heart sounds and intact distal pulses.  Exam reveals no gallop and no friction rub.   No murmur heard. EKG ---  Normal Sinus Rhythm  normal EKG   Pulmonary/Chest: Effort normal and breath sounds normal. No stridor. No respiratory distress. He has no wheezes. He has no rales. He exhibits no tenderness.  Abdominal: Soft. Bowel sounds are normal. He exhibits no distension. There is no tenderness. There is no rebound and no guarding. Hernia confirmed negative in  the right inguinal area and confirmed negative in the left inguinal area.  Genitourinary: Rectum normal, testes normal and penis normal. Rectal exam shows no external hemorrhoid, no internal hemorrhoid, no fissure, no mass, no tenderness, anal tone normal and guaiac negative stool. 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 Prostate is not tender. Right testis shows no mass, no swelling and no tenderness. Right testis is descended. Left testis shows no mass, no swelling and no tenderness. Left testis is descended. Circumcised. No penile tenderness. No discharge found.  Lymphadenopathy:    He has no cervical adenopathy.       Right: No inguinal adenopathy present.       Left: No inguinal adenopathy present.  Skin: He is not diaphoretic.  Vitals reviewed.   Lab Results  Component Value Date   WBC 8.3 08/11/2016   HGB 14.4 08/11/2016   HCT 41.2 08/11/2016   PLT 219.0 08/11/2016   GLUCOSE 126 (H) 08/11/2016   CHOL 110 08/11/2016   TRIG 186.0 (H) 08/11/2016   HDL 30.70 (L) 08/11/2016   LDLDIRECT 60.0 06/13/2015   LDLCALC 42 08/11/2016   ALT 53 08/11/2016   AST 27 08/11/2016   NA 136 08/11/2016   K 4.6 08/11/2016   CL 104 08/11/2016   CREATININE 1.65 (H) 08/11/2016   BUN 35 (H) 08/11/2016   CO2 26 08/11/2016   TSH 3.72 08/11/2016   PSA 3.43 08/11/2016   HGBA1C 5.5 08/11/2016    Dg Chest 2 View  Result Date: 10/27/2013 CLINICAL DATA:  Congestion, shortness of Breath EXAM: CHEST  2 VIEW COMPARISON:  07/22/2011 FINDINGS: Cardiomediastinal silhouette is stable. No acute infiltrate or pleural effusion. No pulmonary edema. Mild elevation of the right hemidiaphragm again noted. Mild degenerative changes thoracic spine. IMPRESSION: No active cardiopulmonary disease. Electronically Signed   By: Lahoma Crocker M.D.   On: 10/27/2013 12:24    Assessment & Plan:   Chez was seen today for hypertension, hyperlipidemia and annual exam.  Diagnoses and all orders for this visit:  Essential  hypertension- his BUN is up to 35, I think he is prerenal state is due to the thiazide diuretic so I've asked him to switch to a more potent ARB for blood pressure control and will try to control his blood pressure without a thiazide diuretic. -     Comprehensive metabolic panel; Future -     CBC with Differential/Platelet; Future -     EKG 12-Lead -     valsartan (DIOVAN) 320 MG tablet; Take 1 tablet (320 mg total) by mouth daily.  Benign prostatic hyperplasia without lower urinary tract symptoms- his PSA is not elevated so not concerned about prostate cancer, he has no symptoms that need to be treated. -     PSA; Future -     Urinalysis, Routine w reflex microscopic; Future  Hyperglycemia- his A1c is down to 5.5%, he was praised for his lifestyle modifications. -     Hemoglobin A1c; Future  Kidney disease, chronic, stage III (GFR 30-59 ml/min)-  his creatinine clearance is down to 53 and he is prerenal, think he would benefit from stopping the thiazide diuretic. 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 -     EKG 12-Lead   I have discontinued Mr. Akkerman losartan-hydrochlorothiazide. I am also having him start on valsartan. Additionally, I am having him maintain his aspirin, citalopram, atorvastatin, and metoprolol.  Meds ordered this encounter  Medications  . valsartan (DIOVAN) 320 MG tablet    Sig: Take 1 tablet (320 mg total) by mouth daily.    Dispense:  90 tablet    Refill:  1   See AVS for instructions about healthy living and anticipatory guidance.  Follow-up: Return  in about 6 months (around 02/10/2017).  Scarlette Calico, MD

## 2016-08-11 NOTE — Progress Notes (Signed)
Pre visit review using our clinic review tool, if applicable. No additional management support is needed unless otherwise documented below in the visit note. 

## 2016-08-11 NOTE — Patient Instructions (Signed)
 Health Maintenance, Male A healthy lifestyle and preventive care is important for your health and wellness. Ask your health care provider about what schedule of regular examinations is right for you. What should I know about weight and diet?  Eat a Healthy Diet  Eat plenty of vegetables, fruits, whole grains, low-fat dairy products, and lean protein.  Do not eat a lot of foods high in solid fats, added sugars, or salt. Maintain a Healthy Weight  Regular exercise can help you achieve or maintain a healthy weight. You should:  Do at least 150 minutes of exercise each week. The exercise should increase your heart rate and make you sweat (moderate-intensity exercise).  Do strength-training exercises at least twice a week. Watch Your Levels of Cholesterol and Blood Lipids  Have your blood tested for lipids and cholesterol every 5 years starting at 74 years of age. If you are at high risk for heart disease, you should start having your blood tested when you are 74 years old. You may need to have your cholesterol levels checked more often if:  Your lipid or cholesterol levels are high.  You are older than 74 years of age.  You are at high risk for heart disease. What should I know about cancer screening? Many types of cancers can be detected early and may often be prevented. Lung Cancer  You should be screened every year for lung cancer if:  You are a current smoker who has smoked for at least 30 years.  You are a former smoker who has quit within the past 15 years.  Talk to your health care provider about your screening options, when you should start screening, and how often you should be screened. Colorectal Cancer  Routine colorectal cancer screening usually begins at 74 years of age and should be repeated every 5-10 years until you are 75 years old. You may need to be screened more often if early forms of precancerous polyps or small growths are found. Your health care provider  may recommend screening at an earlier age if you have risk factors for colon cancer.  Your health care provider may recommend using home test kits to check for hidden blood in the stool.  A small camera at the end of a tube can be used to examine your colon (sigmoidoscopy or colonoscopy). This checks for the earliest forms of colorectal cancer. Prostate and Testicular Cancer  Depending on your age and overall health, your health care provider may do certain tests to screen for prostate and testicular cancer.  Talk to your health care provider about any symptoms or concerns you have about testicular or prostate cancer. Skin Cancer  Check your skin from head to toe regularly.  Tell your health care provider about any new moles or changes in moles, especially if:  There is a change in a mole's size, shape, or color.  You have a mole that is larger than a pencil eraser.  Always use sunscreen. Apply sunscreen liberally and repeat throughout the day.  Protect yourself by wearing long sleeves, pants, a wide-brimmed hat, and sunglasses when outside. What should I know about heart disease, diabetes, and high blood pressure?  If you are 18-39 years of age, have your blood pressure checked every 3-5 years. If you are 40 years of age or older, have your blood pressure checked every year. You should have your blood pressure measured twice-once when you are at a hospital or clinic, and once when you are not at   a hospital or clinic. Record the average of the two measurements. To check your blood pressure when you are not at a hospital or clinic, you can use:  An automated blood pressure machine at a pharmacy.  A home blood pressure monitor.  Talk to your health care provider about your target blood pressure.  If you are between 45-79 years old, ask your health care provider if you should take aspirin to prevent heart disease.  Have regular diabetes screenings by checking your fasting blood sugar  level.  If you are at a normal weight and have a low risk for diabetes, have this test once every three years after the age of 45.  If you are overweight and have a high risk for diabetes, consider being tested at a younger age or more often.  A one-time screening for abdominal aortic aneurysm (AAA) by ultrasound is recommended for men aged 65-75 years who are current or former smokers. What should I know about preventing infection? Hepatitis B  If you have a higher risk for hepatitis B, you should be screened for this virus. Talk with your health care provider to find out if you are at risk for hepatitis B infection. Hepatitis C  Blood testing is recommended for:  Everyone born from 1945 through 1965.  Anyone with known risk factors for hepatitis C. Sexually Transmitted Diseases (STDs)  You should be screened each year for STDs including gonorrhea and chlamydia if:  You are sexually active and are younger than 74 years of age.  You are older than 74 years of age and your health care provider tells you that you are at risk for this type of infection.  Your sexual activity has changed since you were last screened and you are at an increased risk for chlamydia or gonorrhea. Ask your health care provider if you are at risk.  Talk with your health care provider about whether you are at high risk of being infected with HIV. Your health care provider may recommend a prescription medicine to help prevent HIV infection. What else can I do?  Schedule regular health, dental, and eye exams.  Stay current with your vaccines (immunizations).  Do not use any tobacco products, such as cigarettes, chewing tobacco, and e-cigarettes. If you need help quitting, ask your health care provider.  Limit alcohol intake to no more than 2 drinks per day. One drink equals 12 ounces of beer, 5 ounces of wine, or 1 ounces of hard liquor.  Do not use street drugs.  Do not share needles.  Ask your health  care provider for help if you need support or information about quitting drugs.  Tell your health care provider if you often feel depressed.  Tell your health care provider if you have ever been abused or do not feel safe at home. This information is not intended to replace advice given to you by your health care provider. Make sure you discuss any questions you have with your health care provider. Document Released: 10/17/2007 Document Revised: 12/18/2015 Document Reviewed: 01/22/2015 Elsevier Interactive Patient Education  2017 [employer]  

## 2016-08-13 MED ORDER — VALSARTAN 320 MG PO TABS: 320.0000 mg | ORAL_TABLET | Freq: Every day | ORAL | 1 refills | Status: DC

## 2016-08-13 NOTE — Assessment & Plan Note (Signed)

## 2016-08-22 ENCOUNTER — Telehealth: Payer: Self-pay | Admitting: Pulmonary Disease

## 2016-08-22 DIAGNOSIS — G4733 Obstructive sleep apnea (adult) (pediatric): Secondary | ICD-10-CM

## 2016-08-22 NOTE — Telephone Encounter (Signed)
   DL the last month : 97%, AHI 15. On autocpap 5-15 cm water.   [name] : can you tell pt that his AHI is still elevated.  He might still be having mask leak issues or central apneas.  Ideally, he needs a cpap titration study but he wanted to hold off on that when we saw each other in 07/2016.  Can we do a PAP NAP with vernon instead?  Pls ask pt if he is OK with this?  CPAP titration study is better but PAP NAP is an alternative.    Thanks.   Monica Becton, MD 08/22/2016, 3:19 AM [facility] Pager [phone] After 3 pm or if no answer, call [phone]

## 2016-08-25 NOTE — Telephone Encounter (Signed)
lmomtcb x1 

## 2016-08-25 NOTE — Telephone Encounter (Signed)
Spoke with pt's wife who stated pt stepped out, will try to call pt back later

## 2016-08-28 NOTE — Telephone Encounter (Signed)
AD  Please Advise-  Spoke with pt and he liked the idea of the pap nap but feels like he would not be able to fall asleep. He wanted to know if he got a new mask one that covers his nose and mouth would that make a difference

## 2016-08-31 NOTE — Telephone Encounter (Signed)
   My suggestion/advice is for him to get a pap nap >> we will try to let him sleep. He will also try different masks and determine best mask fit. Lynnae Sandhoff will also try to determine the optimal pressure. Ideally, he needs a bipap/cpap titration study but he wants to hold off.   Pls write in the order comments section that pt is currently on autocpap 5-15 cm water so Lynnae Sandhoff will know his current settings.   Thanks.   Monica Becton, MD 08/31/2016, 9:49 PM [facility] Pager [phone] After 3 pm or if no answer, call [phone]

## 2016-09-01 NOTE — Telephone Encounter (Signed)
Spoke with pt and he agreed to the pap nap. Due to him going out of town he wants this done at the end of the month. The order has been placed. Nothing further is needed

## 2016-09-05 ENCOUNTER — Other Ambulatory Visit: Payer: Self-pay | Admitting: Internal Medicine

## 2016-10-06 ENCOUNTER — Ambulatory Visit (HOSPITAL_BASED_OUTPATIENT_CLINIC_OR_DEPARTMENT_OTHER): Payer: Medicare Other | Attending: Pulmonary Disease | Admitting: Pulmonary Disease

## 2016-10-06 DIAGNOSIS — G4733 Obstructive sleep apnea (adult) (pediatric): Secondary | ICD-10-CM | POA: Diagnosis not present

## 2016-10-09 ENCOUNTER — Telehealth: Payer: Self-pay | Admitting: Pulmonary Disease

## 2016-10-09 DIAGNOSIS — G4733 Obstructive sleep apnea (adult) (pediatric): Secondary | ICD-10-CM

## 2016-10-09 NOTE — Telephone Encounter (Signed)
   Pap nap study did NOT show good correction on autocpap 10-15 cm water and still had some leak issues.   [name] : pls tell pt that he needs to have a cpap/bipap titration study to determine optimal pressure.  I think it will be best if we do this.  I wanted him to do it before but he wanted to hold off.  His current machine is NOT working well. Thanks.    Monica Becton, MD 10/09/2016, 10:02 AM [facility] Pager [phone] After 3 pm or if no answer, call [phone]

## 2016-10-09 NOTE — Procedures (Signed)
  Patient Name: Daniel Howe, Daniel Howe Date: 10/06/2016   Gender: Male  D.O.B: Aug 17, 1940  Age (years): 63  Referring Provider: [place]   Height (inches): 74  Interpreting Physician: [place]   Weight (lbs): 245  RPSGT: Jacolyn Reedy   BMI: 31  MRN: [id number]  Neck Size: 18.50    CLINICAL INFORMATION  The patient was referred to the sleep center for evaluation of G47.33 OSA: Adult and Pediatric (327.23). Indications include N/A. This was a session in the lab to determine optimal mask fit as well as optimal cpap pressure. Patient is currently on autocpap 5-15 cm water. Download shows elevated AHI and patient has mask leak issues.   MEDICATIONS  Patient self administered medications include: N/A. Medications administered during study include No sleep medicine administered.  SLEEP STUDY TECHNIQUE  The patient underwent an attended polysomnography titration to assess the effects of cpap therapy.   TECHNICIAN COMMENTS  Comments added by Technician: Patient had difficulty initiating sleep.  Comments added by Scorer: N/A   SLEEP ARCHITECTURE  The study was initiated at 9:14:46 AM and terminated at 12:15:44 PM. The total recorded time was 181.0 minutes.   RESPIRATORY PARAMETERS  The overall AHI was still elevated despite trying patient on cpap 10-15 cm water.    IMPRESSIONS  1. ECG showed no cardiac abnormalities. 2. EEG did not show alpha intrusion. 3. The patient snored with Soft snoring volume. 4. No significant Oxygen Desaturation 5. Severe Obstructive Sleep apnea(OSA) Sub-Optimal pressure attained. 6. Mark reduced sleep efficiency, long primary sleep latency, no REM sleep latency and no slow wave latency.  DIAGNOSIS  1. Obstructive Sleep Apnea (327.23 [G47.33 ICD-10])  RECOMMENDATIONS  1. This PAP NAP session was ordered to determine best mask fit as well as optimal cpap pressure. Patient is currently on autocpap 5-15 cm water and his download  shows elevated AHI. I recommended a cpap titration study to begin with but patient wanted to hold off. His AHI remained elevated despite cpap 10-15 cm water during this session. Patient will need a dedicated cpap/bipap titration study to determine optimal pressure. 2. Recommend CPAP/BiPAP titration given sub-optimal CPAP titration. 3. Follow up in the office as scheduled.  Monica Becton, MD 10/09/2016, 9:59 AM [facility] Pager [phone] After 3 pm or if no answer, call [phone]

## 2016-10-12 NOTE — Telephone Encounter (Signed)
Pt agreed to the order being placed for the titration study. The order was placed. He had no further questions at this time.

## 2016-10-15 NOTE — Telephone Encounter (Signed)
Pt is scheduled for his titration study in August.

## 2016-10-23 NOTE — Telephone Encounter (Signed)
Will close encounter as nothing further is needed. Pt is scheduled for titration on 8/14 and f/u with TP on 9/14

## 2016-11-23 ENCOUNTER — Other Ambulatory Visit: Payer: Self-pay | Admitting: Internal Medicine

## 2016-11-23 DIAGNOSIS — I10 Essential (primary) hypertension: Secondary | ICD-10-CM

## 2016-11-23 MED ORDER — TELMISARTAN 80 MG PO TABS: 80.0000 mg | ORAL_TABLET | Freq: Every day | ORAL | 3 refills | Status: DC

## 2016-11-24 ENCOUNTER — Other Ambulatory Visit: Payer: Self-pay | Admitting: Internal Medicine

## 2016-11-24 DIAGNOSIS — H6123 Impacted cerumen, bilateral: Secondary | ICD-10-CM | POA: Diagnosis not present

## 2016-11-24 DIAGNOSIS — I10 Essential (primary) hypertension: Secondary | ICD-10-CM

## 2016-11-24 DIAGNOSIS — H900 Conductive hearing loss, bilateral: Secondary | ICD-10-CM | POA: Diagnosis not present

## 2016-12-01 ENCOUNTER — Ambulatory Visit (HOSPITAL_BASED_OUTPATIENT_CLINIC_OR_DEPARTMENT_OTHER): Payer: Medicare Other | Attending: Pulmonary Disease | Admitting: Pulmonary Disease

## 2016-12-01 VITALS — Ht 74.0 in | Wt 242.0 lb

## 2016-12-01 DIAGNOSIS — G4733 Obstructive sleep apnea (adult) (pediatric): Secondary | ICD-10-CM | POA: Diagnosis not present

## 2016-12-01 DIAGNOSIS — G4739 Other sleep apnea: Secondary | ICD-10-CM

## 2016-12-01 DIAGNOSIS — G4731 Primary central sleep apnea: Secondary | ICD-10-CM | POA: Insufficient documentation

## 2016-12-08 ENCOUNTER — Telehealth: Payer: Self-pay | Admitting: Pulmonary Disease

## 2016-12-08 DIAGNOSIS — G4733 Obstructive sleep apnea (adult) (pediatric): Secondary | ICD-10-CM | POA: Diagnosis not present

## 2016-12-08 DIAGNOSIS — G4731 Primary central sleep apnea: Secondary | ICD-10-CM | POA: Diagnosis not present

## 2016-12-08 DIAGNOSIS — G4739 Other sleep apnea: Secondary | ICD-10-CM | POA: Insufficient documentation

## 2016-12-08 NOTE — Telephone Encounter (Signed)
CPAP titration 12/01/16 >> CPAP 15 cm H2O.  Central apnea with lower pressures.   Please let him know he did well with CPAP 15 cm H2O.  Please send order to have his CPAP changed to 15 cm H2O.  He needs ROV in 2 months with me or NP.

## 2016-12-08 NOTE — Procedures (Signed)
    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 He has been on CPAP therapy, but has continue daytime sleepiness. He presents to the sleep lab for a CPAP titration study. Date of NPSG, Split Night or HST: April 2017, AHI 20. SLEEP STUDY TECHNIQUE  As per the AASM Manual for the Scoring of Sleep and Associated Events v2.3 (April 2016) with a hypopnea requiring 4% desaturations.  The channels recorded and monitored were frontal, central and occipital EEG, electrooculogram (EOG), submentalis EMG (chin), nasal and oral airflow, thoracic and abdominal wall motion, anterior tibialis EMG, snore microphone, electrocardiogram, and pulse oximetry. Continuous positive airway pressure (CPAP) was initiated at the beginning of the study and titrated to treat sleep-disordered breathing. MEDICATIONS  Medications self-administered by patient taken the night of the study : N/A  TECHNICIAN COMMENTS  Comments added by technician: Patient was restless all through the night.  Comments added by scorer: N/A  RESPIRATORY PARAMETERS  Optimal PAP Pressure (cm): 15 AHI at Optimal Pressure (/hr): 0.0  Overall Minimal O2 (%): 84.00 Supine % at Optimal Pressure (%): 0  Minimal O2 at Optimal Pressure (%): 94.0    SLEEP ARCHITECTURE  The study was initiated at 10:37:08 PM and ended at 4:59:29 AM.  Sleep onset time was 43.9 minutes and the sleep efficiency was 53.6%. The total sleep time was 205.0 minutes.  The patient spent 33.41% of the night in stage N1 sleep, 58.05% in stage N2 sleep, 0.00% in stage N3 and 8.54% in REM.Stage REM latency was 316.0 minutes    Wake after sleep onset was 133.4. Alpha intrusion was absent. Supine sleep was 32.44%. CARDIAC DATA  The 2 lead EKG demonstrated sinus rhythm. The mean heart rate was 50.57 beats per minute. Other EKG findings include: None.  LEG MOVEMENT DATA  The total Periodic Limb Movements of Sleep (PLMS) were 0. The PLMS index was 0.00. A PLMS index of <15 is considered normal in adults. IMPRESSIONS  - He did well with CPAP 15 cm H2O.  - At lower pressure he developed central apneas.  - He did not require supplemental oxygen during this study.  - During this single night titration study, there were no abnormal behaviors noted during sleep to suggest REM sleep behavior disorder. 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  - He was fitted with a Medium size [employer]ips Respironics Nasal Pillow Mask Nuance Pro Gel mask and heated humidification. [Electronically signed] 12/08/2016 12:28 AM Chesley Mires MD, [place], American Board of Sleep Medicine  NPI: [id number]

## 2016-12-11 NOTE — Telephone Encounter (Signed)
Spoke with patient. He is aware of the CPAP pressure changes. Verified that he is still using [facility] as his DME.  Will place order today. Nothing else was needed at time of call.

## 2016-12-15 ENCOUNTER — Encounter (HOSPITAL_BASED_OUTPATIENT_CLINIC_OR_DEPARTMENT_OTHER): Payer: Self-pay

## 2016-12-22 DIAGNOSIS — Z85828 Personal history of other malignant neoplasm of skin: Secondary | ICD-10-CM | POA: Diagnosis not present

## 2016-12-22 DIAGNOSIS — D1801 Hemangioma of skin and subcutaneous tissue: Secondary | ICD-10-CM | POA: Diagnosis not present

## 2016-12-22 DIAGNOSIS — D2362 Other benign neoplasm of skin of left upper limb, including shoulder: Secondary | ICD-10-CM | POA: Diagnosis not present

## 2016-12-22 DIAGNOSIS — L570 Actinic keratosis: Secondary | ICD-10-CM | POA: Diagnosis not present

## 2016-12-22 DIAGNOSIS — L821 Other seborrheic keratosis: Secondary | ICD-10-CM | POA: Diagnosis not present

## 2017-01-15 ENCOUNTER — Ambulatory Visit: Payer: Self-pay | Admitting: Adult Health

## 2017-01-19 ENCOUNTER — Encounter: Payer: Self-pay | Admitting: Adult Health

## 2017-01-19 ENCOUNTER — Ambulatory Visit (INDEPENDENT_AMBULATORY_CARE_PROVIDER_SITE_OTHER): Payer: Medicare Other | Admitting: Adult Health

## 2017-01-19 DIAGNOSIS — G4733 Obstructive sleep apnea (adult) (pediatric): Secondary | ICD-10-CM | POA: Diagnosis not present

## 2017-01-19 DIAGNOSIS — E668 Other obesity: Secondary | ICD-10-CM | POA: Diagnosis not present

## 2017-01-19 NOTE — Assessment & Plan Note (Signed)
Wt loss  

## 2017-01-19 NOTE — Progress Notes (Signed)
@Patient  ID: Daniel Howe, male    DOB: Feb 21, 1941, 76 y.o.   MRN: [id number]  Chief Complaint  Patient presents with  . Follow-up    OSA    Referring provider: Janith Lima, MD  HPI: 76 year old male followed for obstructive sleep apnea  TEST  DATA HST (08/2015) AHI 20. autocpap 5-15.   01/19/2017 Follow up : OSA  Patient presents for a six-month follow-up. She says he's doing well on C Pap. Patient had C Pap compliance, but continued to have some residual events. Patient was set up for his C Pap titration study that was done on 12/01/2016 that showed optimal control on C Pap 15 cm H2O. Central apnea with lower pressures. Patient says he likes his new pressure settings and feels more comfortable. He feels more rested. He denies any significant daytime sleepiness. Download shows excellent compliance with average usage around 6.5. Patient AHI was 7.8. Central events noted. (~5) Positive leaks. She does not notice is leaks. He likes his new nasal mask and chin strap.   No Known Allergies  Immunization History  Administered Date(s) Administered  . Influenza Whole 02/25/2011  . Influenza, High Dose Seasonal PF 01/22/2015, 01/03/2017  . Influenza,inj,Quad PF,6+ Mos 01/29/2014, 02/04/2016  . Influenza-Unspecified 03/07/2013  . Pneumococcal Conjugate-13 11/15/2013  . Pneumococcal Polysaccharide-23 05/30/2010  . Td 05/30/2010  . Zoster 04/05/2013    Past Medical History:  Diagnosis Date  . Anxiety   . Depression   . Hypertension     Tobacco History: History  Smoking Status  . Never Smoker  Smokeless Tobacco  . Never Used   Counseling given: Not Answered   Outpatient Encounter Prescriptions as of 01/19/2017  Medication Sig  . aspirin 81 MG tablet Take 81 mg by mouth daily.  Marland Kitchen atorvastatin (LIPITOR) 40 MG tablet take 1 tablet by mouth once daily  . citalopram (CELEXA) 20 MG tablet take 1 tablet by mouth once daily  . metoprolol (LOPRESSOR) 100 MG tablet take 1  tablet by mouth twice a day  . telmisartan (MICARDIS) 80 MG tablet Take 1 tablet (80 mg total) by mouth daily.   No facility-administered encounter medications on file as of 01/19/2017.      Review of Systems  Constitutional:   No  weight loss, night sweats,  Fevers, chills, fatigue, or  lassitude.  HEENT:   No headaches,  Difficulty swallowing,  Tooth/dental problems, or  Sore throat,                No sneezing, itching, ear ache, nasal congestion, post nasal drip,   CV:  No chest pain,  Orthopnea, PND, swelling in lower extremities, anasarca, dizziness, palpitations, syncope.   GI  No heartburn, indigestion, abdominal pain, nausea, vomiting, diarrhea, change in bowel habits, loss of appetite, bloody stools.   Resp: No shortness of breath with exertion or at rest.  No excess mucus, no productive cough,  No non-productive cough,  No coughing up of blood.  No change in color of mucus.  No wheezing.  No chest wall deformity  Skin: no rash or lesions.  GU: no dysuria, change in color of urine, no urgency or frequency.  No flank pain, no hematuria   MS:  No joint pain or swelling.  No decreased range of motion.  No back pain.    Physical Exam  BP 124/76 (BP Location: Left Arm, Cuff Size: Normal)   Pulse (!) 58   Ht 6\' 2"  (1.88 m)   Wt 264  lb 3.2 oz (119.8 kg)   SpO2 98%   BMI 33.92 kg/m   GEN: A/Ox3; pleasant , NAD, obese    HEENT:  [place]/AT,  EACs-clear, TMs-wnl, NOSE-clear, THROAT-clear, no lesions, no postnasal drip or exudate noted. Class 2 MP airway   NECK:  Supple w/ fair ROM; no JVD; normal carotid impulses w/o bruits; no thyromegaly or nodules palpated; no lymphadenopathy.    RESP  Clear  P & A; w/o, wheezes/ rales/ or rhonchi. no accessory muscle use, no dullness to percussion  CARD:  RRR, no m/r/g, no peripheral edema, pulses intact, no cyanosis or clubbing.  GI:   Soft & nt; nml bowel sounds; no organomegaly or masses detected.   Musco: Warm bil, no deformities or  joint swelling noted.   Neuro: alert, no focal deficits noted.    Skin: Warm, no lesions or rashes    Lab Results:  CBC  No results found for: BNP  ProBNP No results found for: PROBNP  Imaging: No results found.   Assessment & Plan:   OSA (obstructive sleep apnea) Improved control and improved perceived benefit on CPAP at 15cm H2O  Few central events noted ~5 .   Plan  . Patient Instructions  Continue on CPAP At bedtime  . Keep up good work  Do not drive if sleepy  follow up 1 year with Halford Chessman or Saulo Anthis NP and As needed      Moderate obesity Wt loss      Rexene Edison, NP 01/19/2017

## 2017-01-19 NOTE — Patient Instructions (Signed)
Continue on CPAP At bedtime  . Keep up good work  Do not drive if sleepy  follow up 1 year with Halford Chessman or Velva Molinari NP and As needed

## 2017-01-19 NOTE — Assessment & Plan Note (Signed)
Improved control and improved perceived benefit on CPAP at 15cm H2O  Few central events noted ~5 .   Plan  . Patient Instructions  Continue on CPAP At bedtime  . Keep up good work  Do not drive if sleepy  follow up 1 year with Daniel Howe or Daniel Armas NP and As needed

## 2017-01-20 NOTE — Progress Notes (Signed)
I have reviewed and agree with assessment/plan.  Chesley Mires, MD [facility] 01/20/2017, 10:14 AM Pager:  [phone]

## 2017-04-09 DIAGNOSIS — G4733 Obstructive sleep apnea (adult) (pediatric): Secondary | ICD-10-CM | POA: Diagnosis not present

## 2017-05-06 DIAGNOSIS — G4733 Obstructive sleep apnea (adult) (pediatric): Secondary | ICD-10-CM | POA: Diagnosis not present

## 2017-05-12 DIAGNOSIS — Z9841 Cataract extraction status, right eye: Secondary | ICD-10-CM | POA: Diagnosis not present

## 2017-05-12 DIAGNOSIS — Z961 Presence of intraocular lens: Secondary | ICD-10-CM | POA: Diagnosis not present

## 2017-05-12 DIAGNOSIS — H1851 Endothelial corneal dystrophy: Secondary | ICD-10-CM | POA: Diagnosis not present

## 2017-05-12 DIAGNOSIS — Z947 Corneal transplant status: Secondary | ICD-10-CM | POA: Diagnosis not present

## 2017-05-30 ENCOUNTER — Other Ambulatory Visit: Payer: Self-pay | Admitting: Internal Medicine

## 2017-06-01 ENCOUNTER — Telehealth: Payer: Self-pay

## 2017-06-01 DIAGNOSIS — F329 Major depressive disorder, single episode, unspecified: Secondary | ICD-10-CM

## 2017-06-01 DIAGNOSIS — F32A Depression, unspecified: Secondary | ICD-10-CM

## 2017-06-01 NOTE — Telephone Encounter (Signed)
.  Refill meds for citalopram from [facility] . Lov 08/11/2016. pcp instruction to f/u in 6 months. Pt needs an appt for any refill

## 2017-06-02 MED ORDER — CITALOPRAM HYDROBROMIDE 20 MG PO TABS: 20.0000 mg | ORAL_TABLET | Freq: Every day | ORAL | 0 refills | Status: DC

## 2017-06-02 NOTE — Telephone Encounter (Signed)
Chart review. App made. ERx sent for 30 days as requested.

## 2017-06-02 NOTE — Telephone Encounter (Signed)
Informed pt he need to schedule an Office Visit. I ask pt to call back to schedule that appointment.

## 2017-06-15 ENCOUNTER — Ambulatory Visit (INDEPENDENT_AMBULATORY_CARE_PROVIDER_SITE_OTHER): Payer: Medicare Other | Admitting: Internal Medicine

## 2017-06-15 ENCOUNTER — Encounter: Payer: Self-pay | Admitting: Internal Medicine

## 2017-06-15 VITALS — BP 142/84 | HR 51 | Temp 98.1°F | Ht 74.0 in | Wt 247.0 lb

## 2017-06-15 DIAGNOSIS — F329 Major depressive disorder, single episode, unspecified: Secondary | ICD-10-CM | POA: Diagnosis not present

## 2017-06-15 DIAGNOSIS — I10 Essential (primary) hypertension: Secondary | ICD-10-CM | POA: Diagnosis not present

## 2017-06-15 DIAGNOSIS — H6122 Impacted cerumen, left ear: Secondary | ICD-10-CM | POA: Diagnosis not present

## 2017-06-15 DIAGNOSIS — F32A Depression, unspecified: Secondary | ICD-10-CM

## 2017-06-15 MED ORDER — CITALOPRAM HYDROBROMIDE 20 MG PO TABS: 20.0000 mg | ORAL_TABLET | Freq: Every day | ORAL | 1 refills | Status: DC

## 2017-06-15 NOTE — Progress Notes (Signed)
Subjective:  Patient ID: Daniel Howe, male    DOB: 14-Sep-1940  Age: 77 y.o. MRN: [id number]  CC: Hypertension   HPI JAVI BOLLMAN presents for f/up - He tells me his blood pressure has been well controlled.  He is very active and works out at [employer].  He has had no recent episodes of DOE/CP/fatigue/palpitations/or edema.  He complains of hearing loss over his left ear.  Outpatient Medications Prior to Visit  Medication Sig Dispense Refill  . aspirin 81 MG tablet Take 81 mg by mouth daily.    Marland Kitchen atorvastatin (LIPITOR) 40 MG tablet take 1 tablet by mouth once daily 90 tablet 3  . metoprolol (LOPRESSOR) 100 MG tablet take 1 tablet by mouth twice a day 60 tablet 11  . telmisartan (MICARDIS) 80 MG tablet Take 1 tablet (80 mg total) by mouth daily. 90 tablet 3  . citalopram (CELEXA) 20 MG tablet Take 1 tablet (20 mg total) by mouth daily. 30 tablet 0   No facility-administered medications prior to visit.     ROS Review of Systems  Constitutional: Negative for diaphoresis, fatigue and unexpected weight change.  HENT: Positive for hearing loss. Negative for ear discharge, ear pain and sinus pressure.   Eyes: Negative for visual disturbance.  Respiratory: Negative for cough, chest tightness, shortness of breath and wheezing.   Gastrointestinal: Negative for abdominal pain, constipation, diarrhea, nausea and vomiting.  Endocrine: Negative.   Genitourinary: Negative.  Negative for difficulty urinating.  Musculoskeletal: Negative.  Negative for arthralgias and myalgias.  Skin: Negative.  Negative for color change.  Allergic/Immunologic: Negative.   Neurological: Negative.  Negative for dizziness.  Hematological: Negative for adenopathy. Does not bruise/bleed easily.  Psychiatric/Behavioral: Negative.  Negative for decreased concentration, dysphoric mood, sleep disturbance and suicidal ideas. The patient is not nervous/anxious.     Objective:  BP (!) 142/84 (BP Location: Right  Arm, Patient Position: Sitting, Cuff Size: Normal)   Pulse (!) 51   Temp 98.1 F (36.7 C) (Oral)   Ht 6\' 2"  (1.88 m)   Wt 247 lb (112 kg)   SpO2 98%   BMI 31.71 kg/m   BP Readings from Last 3 Encounters:  06/15/17 (!) 142/84  01/19/17 124/76  08/11/16 138/78    Wt Readings from Last 3 Encounters:  06/15/17 247 lb (112 kg)  01/19/17 264 lb 3.2 oz (119.8 kg)  12/01/16 242 lb (109.8 kg)    Physical Exam  Constitutional: He is oriented to person, place, and time. No distress.  HENT:  Right Ear: Hearing, tympanic membrane, external ear and ear canal normal.  Left Ear: Hearing, tympanic membrane, external ear and ear canal normal.  Mouth/Throat: Oropharynx is clear and moist. No oropharyngeal exudate.  There is a cerumen impaction in the left EAC. I put Colace in his EAC and irrigated it with water and an ear pick and the wax was removed.  The examination afterwards shows that the [facility] and TM are normal.  Eyes: Conjunctivae are normal. Left eye exhibits no discharge. No scleral icterus.  Neck: Normal range of motion. Neck supple. No JVD present. No thyromegaly present.  Cardiovascular: Normal rate, regular rhythm and normal heart sounds. Exam reveals no gallop.  No murmur heard. Pulmonary/Chest: Effort normal and breath sounds normal. No respiratory distress. He has no wheezes. He has no rales.  Abdominal: Soft. Bowel sounds are normal. He exhibits no distension and no mass. There is no guarding.  Musculoskeletal: Normal range of motion. He exhibits  no edema.  Lymphadenopathy:    He has no cervical adenopathy.  Neurological: He is alert and oriented to person, place, and time.  Skin: Skin is warm and dry. He is not diaphoretic.  Psychiatric: He has a normal mood and affect. His behavior is normal. Judgment and thought content normal.  Vitals reviewed.   Lab Results  Component Value Date   WBC 8.3 08/11/2016   HGB 14.4 08/11/2016   HCT 41.2 08/11/2016   PLT 219.0 08/11/2016     GLUCOSE 126 (H) 08/11/2016   CHOL 110 08/11/2016   TRIG 186.0 (H) 08/11/2016   HDL 30.70 (L) 08/11/2016   LDLDIRECT 60.0 06/13/2015   LDLCALC 42 08/11/2016   ALT 53 08/11/2016   AST 27 08/11/2016   NA 136 08/11/2016   K 4.6 08/11/2016   CL 104 08/11/2016   CREATININE 1.65 (H) 08/11/2016   BUN 35 (H) 08/11/2016   CO2 26 08/11/2016   TSH 3.72 08/11/2016   PSA 3.43 08/11/2016   HGBA1C 5.5 08/11/2016    Dg Chest 2 View  Result Date: 10/27/2013 CLINICAL DATA:  Congestion, shortness of Breath EXAM: CHEST  2 VIEW COMPARISON:  07/22/2011 FINDINGS: Cardiomediastinal silhouette is stable. No acute infiltrate or pleural effusion. No pulmonary edema. Mild elevation of the right hemidiaphragm again noted. Mild degenerative changes thoracic spine. IMPRESSION: No active cardiopulmonary disease. Electronically Signed   By: Lahoma Crocker M.D.   On: 10/27/2013 12:24    Assessment & Plan:   Calem was seen today for hypertension.  Diagnoses and all orders for this visit:  Hearing loss due to cerumen impaction, left- This was successfully removed.  Depression, controlled- He is doing well on the current dose of citalopram. -     citalopram (CELEXA) 20 MG tablet; Take 1 tablet (20 mg total) by mouth daily.  Essential hypertension- His blood pressure is adequately well controlled.   I am having Daniel Howe maintain his aspirin, atorvastatin, metoprolol tartrate, telmisartan, and citalopram.  Meds ordered this encounter  Medications  . citalopram (CELEXA) 20 MG tablet    Sig: Take 1 tablet (20 mg total) by mouth daily.    Dispense:  90 tablet    Refill:  1     Follow-up: Return in about 4 months (around 10/13/2017).  Scarlette Calico, MD

## 2017-06-15 NOTE — Patient Instructions (Signed)

## 2017-06-21 ENCOUNTER — Other Ambulatory Visit: Payer: Self-pay | Admitting: Internal Medicine

## 2017-06-21 DIAGNOSIS — J101 Influenza due to other identified influenza virus with other respiratory manifestations: Secondary | ICD-10-CM

## 2017-06-21 MED ORDER — HYDROCODONE-HOMATROPINE 5-1.5 MG/5ML PO SYRP: 5.0000 mL | ORAL_SOLUTION | Freq: Three times a day (TID) | ORAL | 0 refills | Status: DC | PRN

## 2017-06-21 MED ORDER — OSELTAMIVIR PHOSPHATE 75 MG PO CAPS: 75.0000 mg | ORAL_CAPSULE | Freq: Two times a day (BID) | ORAL | 0 refills | Status: AC

## 2017-06-22 DIAGNOSIS — L570 Actinic keratosis: Secondary | ICD-10-CM | POA: Diagnosis not present

## 2017-06-22 DIAGNOSIS — Z85828 Personal history of other malignant neoplasm of skin: Secondary | ICD-10-CM | POA: Diagnosis not present

## 2017-06-22 DIAGNOSIS — L821 Other seborrheic keratosis: Secondary | ICD-10-CM | POA: Diagnosis not present

## 2017-06-22 DIAGNOSIS — D225 Melanocytic nevi of trunk: Secondary | ICD-10-CM | POA: Diagnosis not present

## 2017-06-22 DIAGNOSIS — D1801 Hemangioma of skin and subcutaneous tissue: Secondary | ICD-10-CM | POA: Diagnosis not present

## 2017-07-12 ENCOUNTER — Other Ambulatory Visit: Payer: Self-pay | Admitting: Internal Medicine

## 2017-07-12 DIAGNOSIS — B351 Tinea unguium: Secondary | ICD-10-CM

## 2017-07-12 MED ORDER — TERBINAFINE HCL 250 MG PO TABS: 250.0000 mg | ORAL_TABLET | Freq: Every day | ORAL | 1 refills | Status: DC

## 2017-08-09 ENCOUNTER — Other Ambulatory Visit: Payer: Self-pay | Admitting: Internal Medicine

## 2017-08-09 ENCOUNTER — Encounter: Payer: Self-pay | Admitting: Internal Medicine

## 2017-08-09 ENCOUNTER — Other Ambulatory Visit (INDEPENDENT_AMBULATORY_CARE_PROVIDER_SITE_OTHER): Payer: Self-pay

## 2017-08-09 DIAGNOSIS — B351 Tinea unguium: Secondary | ICD-10-CM

## 2017-08-09 DIAGNOSIS — I10 Essential (primary) hypertension: Secondary | ICD-10-CM

## 2017-08-09 DIAGNOSIS — N183 Chronic kidney disease, stage 3 unspecified: Secondary | ICD-10-CM

## 2017-08-09 DIAGNOSIS — M79609 Pain in unspecified limb: Secondary | ICD-10-CM

## 2017-08-09 LAB — COMPREHENSIVE METABOLIC PANEL
ALBUMIN: 3.8 g/dL (ref 3.5–5.2)
ALK PHOS: 48 U/L (ref 39–117)
ALT: 36 U/L (ref 0–53)
AST: 18 U/L (ref 0–37)
BUN: 34 mg/dL — ABNORMAL HIGH (ref 6–23)
CALCIUM: 8.6 mg/dL (ref 8.4–10.5)
CO2: 24 mEq/L (ref 19–32)
Chloride: 106 mEq/L (ref 96–112)
Creatinine, Ser: 1.6 mg/dL — ABNORMAL HIGH (ref 0.40–1.50)
GFR: 45.08 mL/min — AB (ref >60.00)
GLUCOSE: 110 mg/dL — AB (ref 70–99)
POTASSIUM: 4.4 meq/L (ref 3.5–5.1)
Sodium: 138 mEq/L (ref 135–145)
Total Bilirubin: 0.6 mg/dL (ref 0.2–1.2)
Total Protein: 6.8 g/dL (ref 6.0–8.3)

## 2017-08-20 ENCOUNTER — Other Ambulatory Visit: Payer: Self-pay | Admitting: Internal Medicine

## 2017-08-20 DIAGNOSIS — E7800 Pure hypercholesterolemia, unspecified: Secondary | ICD-10-CM

## 2017-08-22 ENCOUNTER — Other Ambulatory Visit: Payer: Self-pay | Admitting: Internal Medicine

## 2017-08-23 ENCOUNTER — Other Ambulatory Visit: Payer: Self-pay | Admitting: Internal Medicine

## 2017-08-23 DIAGNOSIS — E7800 Pure hypercholesterolemia, unspecified: Secondary | ICD-10-CM

## 2017-10-19 ENCOUNTER — Telehealth: Payer: Self-pay | Admitting: Emergency Medicine

## 2017-10-19 NOTE — Telephone Encounter (Signed)
Called patient to schedule AWV. Pt declined at this time. 

## 2017-11-06 ENCOUNTER — Other Ambulatory Visit: Payer: Self-pay | Admitting: Internal Medicine

## 2017-11-06 DIAGNOSIS — I10 Essential (primary) hypertension: Secondary | ICD-10-CM

## 2017-12-13 ENCOUNTER — Other Ambulatory Visit: Payer: Self-pay | Admitting: Internal Medicine

## 2017-12-13 DIAGNOSIS — F329 Major depressive disorder, single episode, unspecified: Secondary | ICD-10-CM

## 2017-12-13 DIAGNOSIS — F32A Depression, unspecified: Secondary | ICD-10-CM

## 2018-02-05 ENCOUNTER — Other Ambulatory Visit: Payer: Self-pay | Admitting: Internal Medicine

## 2018-02-05 DIAGNOSIS — I10 Essential (primary) hypertension: Secondary | ICD-10-CM

## 2018-02-16 ENCOUNTER — Other Ambulatory Visit: Payer: Self-pay | Admitting: Internal Medicine

## 2018-05-08 ENCOUNTER — Other Ambulatory Visit: Payer: Self-pay | Admitting: Internal Medicine

## 2018-05-08 DIAGNOSIS — I10 Essential (primary) hypertension: Secondary | ICD-10-CM

## 2018-05-15 ENCOUNTER — Other Ambulatory Visit: Payer: Self-pay | Admitting: Internal Medicine

## 2018-05-18 ENCOUNTER — Other Ambulatory Visit: Payer: Self-pay | Admitting: Internal Medicine

## 2018-05-18 DIAGNOSIS — E7800 Pure hypercholesterolemia, unspecified: Secondary | ICD-10-CM

## 2018-05-25 DIAGNOSIS — R001 Bradycardia, unspecified: Secondary | ICD-10-CM | POA: Diagnosis not present

## 2018-06-06 ENCOUNTER — Ambulatory Visit (INDEPENDENT_AMBULATORY_CARE_PROVIDER_SITE_OTHER): Payer: Medicare Other | Admitting: Internal Medicine

## 2018-06-06 ENCOUNTER — Encounter: Payer: Self-pay | Admitting: Internal Medicine

## 2018-06-06 VITALS — BP 138/80 | HR 65 | Temp 98.0°F | Resp 16 | Ht 74.0 in | Wt 248.0 lb

## 2018-06-06 DIAGNOSIS — Z5189 Encounter for other specified aftercare: Secondary | ICD-10-CM | POA: Diagnosis not present

## 2018-06-06 NOTE — Progress Notes (Signed)
Subjective:  Patient ID: Daniel Howe, male    DOB: June 10, 1940  Age: 78 y.o. MRN: [id number]  CC: Wound Check   HPI Daniel Howe presents for a wound check - He injured his right middle finger about 10 days ago and received 3 sutures at the tip of the finger.  He tells me the area has healed nicely with no pain, redness, swelling, numbness, weakness, or tingling.  Outpatient Medications Prior to Visit  Medication Sig Dispense Refill  . aspirin 81 MG tablet Take 81 mg by mouth daily.    Marland Kitchen atorvastatin (LIPITOR) 40 MG tablet TAKE 1 TABLET BY MOUTH ONCE DAILY 90 tablet 0  . citalopram (CELEXA) 20 MG tablet TAKE 1 TABLET BY MOUTH ONCE DAILY 90 tablet 1  . HYDROcodone-homatropine (HYCODAN) 5-1.5 MG/5ML syrup Take 5 mLs by mouth every 8 (eight) hours as needed for cough. 120 mL 0  . metoprolol tartrate (LOPRESSOR) 100 MG tablet TAKE 1 TABLET BY MOUTH TWICE A DAY 180 tablet 0  . telmisartan (MICARDIS) 80 MG tablet TAKE 1 TABLET BY MOUTH ONCE DAILY 90 tablet 0  . terbinafine (LAMISIL) 250 MG tablet Take 1 tablet (250 mg total) by mouth daily. 30 tablet 1   No facility-administered medications prior to visit.     ROS Review of Systems  All other systems reviewed and are negative.   Objective:  BP 138/80   Pulse 65   Temp 98 F (36.7 C) (Oral)   Resp 16   Ht 6\' 2"  (1.88 m)   Wt 248 lb (112.5 kg)   SpO2 98%   BMI 31.84 kg/m   BP Readings from Last 3 Encounters:  06/06/18 138/80  06/15/17 (!) 142/84  01/19/17 124/76    Wt Readings from Last 3 Encounters:  06/06/18 248 lb (112.5 kg)  06/15/17 247 lb (112 kg)  01/19/17 264 lb 3.2 oz (119.8 kg)    Physical Exam Musculoskeletal:       Hands:     Lab Results  Component Value Date   WBC 8.3 08/11/2016   HGB 14.4 08/11/2016   HCT 41.2 08/11/2016   PLT 219.0 08/11/2016   GLUCOSE 110 (H) 08/09/2017   CHOL 110 08/11/2016   TRIG 186.0 (H) 08/11/2016   HDL 30.70 (L) 08/11/2016   LDLDIRECT 60.0 06/13/2015   LDLCALC 42 08/11/2016   ALT 36 08/09/2017   AST 18 08/09/2017   NA 138 08/09/2017   K 4.4 08/09/2017   CL 106 08/09/2017   CREATININE 1.60 (H) 08/09/2017   BUN 34 (H) 08/09/2017   CO2 24 08/09/2017   TSH 3.72 08/11/2016   PSA 3.43 08/11/2016   HGBA1C 5.5 08/11/2016    Dg Chest 2 View  Result Date: 10/27/2013 CLINICAL DATA:  Congestion, shortness of Breath EXAM: CHEST  2 VIEW COMPARISON:  07/22/2011 FINDINGS: Cardiomediastinal silhouette is stable. No acute infiltrate or pleural effusion. No pulmonary edema. Mild elevation of the right hemidiaphragm again noted. Mild degenerative changes thoracic spine. IMPRESSION: No active cardiopulmonary disease. Electronically Signed   By: Lahoma Crocker M.D.   On: 10/27/2013 12:24    Assessment & Plan:   Shahzain was seen today for wound check.  Diagnoses and all orders for this visit:  Encounter for post-traumatic wound check- Sutures removed.  The wound is healed.   I am having Daniel Howe maintain his aspirin, HYDROcodone-homatropine, terbinafine, citalopram, telmisartan, metoprolol tartrate, and atorvastatin.  No orders of the defined types were placed in this encounter.  Follow-up: Return in about 3 weeks (around 06/27/2018).  Scarlette Calico, MD

## 2018-06-06 NOTE — Patient Instructions (Signed)
Wound Care, Adult  Taking care of your wound properly can help to prevent pain, infection, and scarring. It can also help your wound to heal more quickly.  How to care for your wound  Wound care          Follow instructions from your health care provider about how to take care of your wound. Make sure you:  ? Wash your hands with soap and water before you change the bandage (dressing). If soap and water are not available, use hand sanitizer.  ? Change your dressing as told by your health care provider.  ? Leave stitches (sutures), skin glue, or adhesive strips in place. These skin closures may need to stay in place for 2 weeks or longer. If adhesive strip edges start to loosen and curl up, you may trim the loose edges. Do not remove adhesive strips completely unless your health care provider tells you to do that.   Check your wound area every day for signs of infection. Check for:  ? Redness, swelling, or pain.  ? Fluid or blood.  ? Warmth.  ? Pus or a bad smell.   Ask your health care provider if you should clean the wound with mild soap and water. Doing this may include:  ? Using a clean towel to pat the wound dry after cleaning it. Do not rub or scrub the wound.  ? Applying a cream or ointment. Do this only as told by your health care provider.  ? Covering the incision with a clean dressing.   Ask your health care provider when you can leave the wound uncovered.   Keep the dressing dry until your health care provider says it can be removed. Do not take baths, swim, use a hot tub, or do anything that would put the wound underwater until your health care provider approves. Ask your health care provider if you can take showers. You may only be allowed to take sponge baths.  Medicines     If you were prescribed an antibiotic medicine, cream, or ointment, take or use the antibiotic as told by your health care provider. Do not stop taking or using the antibiotic even if your condition improves.   Take  over-the-counter and prescription medicines only as told by your health care provider. If you were prescribed pain medicine, take it 30 or more minutes before you do any wound care or as told by your health care provider.  General instructions   Return to your normal activities as told by your health care provider. Ask your health care provider what activities are safe.   Do not scratch or pick at the wound.   Do not use any products that contain nicotine or tobacco, such as cigarettes and e-cigarettes. These may delay wound healing. If you need help quitting, ask your health care provider.   Keep all follow-up visits as told by your health care provider. This is important.   Eat a diet that includes protein, vitamin A, vitamin C, and other nutrient-rich foods to help the wound heal.  ? Foods rich in protein include meat, dairy, beans, nuts, and other sources.  ? Foods rich in vitamin A include carrots and dark green, leafy vegetables.  ? Foods rich in vitamin C include citrus, tomatoes, and other fruits and vegetables.  ? Nutrient-rich foods have protein, carbohydrates, fat, vitamins, or minerals. Eat a variety of healthy foods including vegetables, fruits, and whole grains.  Contact a health care provider if:     You received a tetanus shot and you have swelling, severe pain, redness, or bleeding at the injection site.   Your pain is not controlled with medicine.   You have redness, swelling, or pain around the wound.   You have fluid or blood coming from the wound.   Your wound feels warm to the touch.   You have pus or a bad smell coming from the wound.   You have a fever or chills.   You are nauseous or you vomit.   You are dizzy.  Get help right away if:   You have a red streak going away from your wound.   The edges of the wound open up and separate.   Your wound is bleeding, and the bleeding does not stop with gentle pressure.   You have a rash.   You faint.   You have trouble  breathing.  Summary   Always wash your hands with soap and water before changing your bandage (dressing).   To help with healing, eat foods that are rich in protein, vitamin A, vitamin C, and other nutrients.   Check your wound every day for signs of infection. Contact your health care provider if you suspect that your wound is infected.  This information is not intended to replace advice given to you by your health care provider. Make sure you discuss any questions you have with your health care provider.  Document Released: 01/28/2008 Document Revised: 06/01/2017 Document Reviewed: 11/05/2015  Elsevier Interactive Patient Education  2019 [employer]

## 2018-06-08 ENCOUNTER — Other Ambulatory Visit: Payer: Self-pay | Admitting: Internal Medicine

## 2018-06-08 DIAGNOSIS — F32A Depression, unspecified: Secondary | ICD-10-CM

## 2018-06-08 DIAGNOSIS — F329 Major depressive disorder, single episode, unspecified: Secondary | ICD-10-CM

## 2018-06-22 DIAGNOSIS — D044 Carcinoma in situ of skin of scalp and neck: Secondary | ICD-10-CM | POA: Diagnosis not present

## 2018-06-22 DIAGNOSIS — L821 Other seborrheic keratosis: Secondary | ICD-10-CM | POA: Diagnosis not present

## 2018-06-22 DIAGNOSIS — C44319 Basal cell carcinoma of skin of other parts of face: Secondary | ICD-10-CM | POA: Diagnosis not present

## 2018-06-22 DIAGNOSIS — L570 Actinic keratosis: Secondary | ICD-10-CM | POA: Diagnosis not present

## 2018-06-22 DIAGNOSIS — Z85828 Personal history of other malignant neoplasm of skin: Secondary | ICD-10-CM | POA: Diagnosis not present

## 2018-06-22 DIAGNOSIS — L565 Disseminated superficial actinic porokeratosis (DSAP): Secondary | ICD-10-CM | POA: Diagnosis not present

## 2018-06-22 DIAGNOSIS — D1801 Hemangioma of skin and subcutaneous tissue: Secondary | ICD-10-CM | POA: Diagnosis not present

## 2018-06-29 DIAGNOSIS — D044 Carcinoma in situ of skin of scalp and neck: Secondary | ICD-10-CM | POA: Diagnosis not present

## 2018-07-05 ENCOUNTER — Other Ambulatory Visit (INDEPENDENT_AMBULATORY_CARE_PROVIDER_SITE_OTHER): Payer: Medicare Other

## 2018-07-05 ENCOUNTER — Encounter: Payer: Self-pay | Admitting: Internal Medicine

## 2018-07-05 ENCOUNTER — Ambulatory Visit (INDEPENDENT_AMBULATORY_CARE_PROVIDER_SITE_OTHER): Payer: Medicare Other | Admitting: Internal Medicine

## 2018-07-05 VITALS — BP 130/68 | HR 52 | Temp 98.2°F | Resp 16 | Ht 74.0 in | Wt 238.8 lb

## 2018-07-05 DIAGNOSIS — N400 Enlarged prostate without lower urinary tract symptoms: Secondary | ICD-10-CM | POA: Diagnosis not present

## 2018-07-05 DIAGNOSIS — R739 Hyperglycemia, unspecified: Secondary | ICD-10-CM | POA: Diagnosis not present

## 2018-07-05 DIAGNOSIS — Z0000 Encounter for general adult medical examination without abnormal findings: Secondary | ICD-10-CM

## 2018-07-05 DIAGNOSIS — E7800 Pure hypercholesterolemia, unspecified: Secondary | ICD-10-CM

## 2018-07-05 DIAGNOSIS — N183 Chronic kidney disease, stage 3 unspecified: Secondary | ICD-10-CM

## 2018-07-05 DIAGNOSIS — E781 Pure hyperglyceridemia: Secondary | ICD-10-CM

## 2018-07-05 DIAGNOSIS — I441 Atrioventricular block, second degree: Secondary | ICD-10-CM | POA: Insufficient documentation

## 2018-07-05 DIAGNOSIS — R001 Bradycardia, unspecified: Secondary | ICD-10-CM | POA: Diagnosis not present

## 2018-07-05 DIAGNOSIS — I10 Essential (primary) hypertension: Secondary | ICD-10-CM

## 2018-07-05 DIAGNOSIS — Z0001 Encounter for general adult medical examination with abnormal findings: Secondary | ICD-10-CM

## 2018-07-05 DIAGNOSIS — N410 Acute prostatitis: Secondary | ICD-10-CM | POA: Insufficient documentation

## 2018-07-05 LAB — COMPREHENSIVE METABOLIC PANEL
ALT: 45 U/L (ref 0–53)
AST: 22 U/L (ref 0–37)
Albumin: 4.2 g/dL (ref 3.5–5.2)
Alkaline Phosphatase: 67 U/L (ref 39–117)
BUN: 44 mg/dL — ABNORMAL HIGH (ref 6–23)
CO2: 21 mEq/L (ref 19–32)
Calcium: 8.8 mg/dL (ref 8.4–10.5)
Chloride: 108 mEq/L (ref 96–112)
Creatinine, Ser: 1.74 mg/dL — ABNORMAL HIGH (ref 0.40–1.50)
GFR: 38.4 mL/min — ABNORMAL LOW (ref >60.00)
Glucose, Bld: 103 mg/dL — ABNORMAL HIGH (ref 70–99)
Potassium: 4.7 mEq/L (ref 3.5–5.1)
SODIUM: 138 meq/L (ref 135–145)
Total Bilirubin: 1 mg/dL (ref 0.2–1.2)
Total Protein: 7 g/dL (ref 6.0–8.3)

## 2018-07-05 LAB — URINALYSIS, ROUTINE W REFLEX MICROSCOPIC
Bilirubin Urine: NEGATIVE
Hgb urine dipstick: NEGATIVE
Ketones, ur: NEGATIVE
Leukocytes,Ua: NEGATIVE
Nitrite: NEGATIVE
RBC / HPF: NONE SEEN (ref 0–?)
SPECIFIC GRAVITY, URINE: 1.02 (ref 1.000–1.030)
Total Protein, Urine: NEGATIVE
Urine Glucose: NEGATIVE
Urobilinogen, UA: 0.2 (ref 0.0–1.0)
pH: 6 (ref 5.0–8.0)

## 2018-07-05 LAB — CBC WITH DIFFERENTIAL/PLATELET
Basophils Absolute: 0.1 10*3/uL (ref 0.0–0.1)
Basophils Relative: 0.6 % (ref 0.0–3.0)
Eosinophils Absolute: 0.3 10*3/uL (ref 0.0–0.7)
Eosinophils Relative: 2.6 % (ref 0.0–5.0)
HCT: 41.3 % (ref 39.0–52.0)
Hemoglobin: 14.3 g/dL (ref 13.0–17.0)
Lymphocytes Relative: 16 % (ref 12.0–46.0)
Lymphs Abs: 1.5 10*3/uL (ref 0.7–4.0)
MCHC: 34.6 g/dL (ref 30.0–36.0)
MCV: 95.8 fl (ref 78.0–100.0)
MONO ABS: 1.4 10*3/uL — AB (ref 0.1–1.0)
Monocytes Relative: 14.9 % — ABNORMAL HIGH (ref 3.0–12.0)
Neutro Abs: 6.3 10*3/uL (ref 1.4–7.7)
Neutrophils Relative %: 65.9 % (ref 43.0–77.0)
Platelets: 239 10*3/uL (ref 150.0–400.0)
RBC: 4.31 Mil/uL (ref 4.22–5.81)
RDW: 13 % (ref 11.5–15.5)
WBC: 9.5 10*3/uL (ref 4.0–10.5)

## 2018-07-05 LAB — LIPID PANEL
CHOLESTEROL: 101 mg/dL (ref 0–200)
HDL: 28.3 mg/dL — ABNORMAL LOW (ref >39.00)
LDL Cholesterol: 40 mg/dL (ref 0–99)
NonHDL: 72.73
Total CHOL/HDL Ratio: 4
Triglycerides: 164 mg/dL — ABNORMAL HIGH (ref 0.0–149.0)
VLDL: 32.8 mg/dL (ref 0.0–40.0)

## 2018-07-05 LAB — TSH: TSH: 4.03 u[IU]/mL (ref 0.35–4.50)

## 2018-07-05 LAB — PSA: PSA: 5.21 ng/mL — AB (ref 0.10–4.00)

## 2018-07-05 LAB — HEMOGLOBIN A1C: Hgb A1c MFr Bld: 5.6 % (ref 4.6–6.5)

## 2018-07-05 MED ORDER — LEVOFLOXACIN 500 MG PO TABS: 500.0000 mg | ORAL_TABLET | Freq: Every day | ORAL | 0 refills | Status: AC

## 2018-07-05 MED ORDER — METOPROLOL TARTRATE 50 MG PO TABS: 50.0000 mg | ORAL_TABLET | Freq: Two times a day (BID) | ORAL | 0 refills | Status: DC

## 2018-07-05 NOTE — Patient Instructions (Signed)

## 2018-07-05 NOTE — Progress Notes (Signed)
Subjective:  Patient ID: Daniel Howe, male    DOB: 04-Aug-1940  Age: 78 y.o. MRN: [id number]  CC: Annual Exam and Hypertension   HPI Daniel Howe presents for a CPX.  He is very active and denies any recent episodes of fatigue, palpitations, dizziness, lightheadedness, DOE, edema, or fatigue.  He occasionally takes NSAIDs for musculoskeletal pain.  He has intentionally lost weight with lifestyle modifications.  Past Medical History:  Diagnosis Date  . Anxiety   . Depression   . Hypertension    Past Surgical History:  Procedure Laterality Date  . CATARACT EXTRACTION    . CORNEAL TRANSPLANT    . HERNIA REPAIR  03/09/12   lih repair  . INGUINAL HERNIA REPAIR  03/09/2012   Procedure: HERNIA REPAIR INGUINAL ADULT;  Surgeon: Pedro Earls, MD;  Location: [facility];  Service: General;  Laterality: Left;    reports that he has never smoked. He has never used smokeless tobacco. He reports current alcohol use of about 14.0 standard drinks of alcohol per week. He reports that he does not use drugs. family history includes Diabetes in his brother; Heart disease in his brother. No Known Allergies  Outpatient Medications Prior to Visit  Medication Sig Dispense Refill  . aspirin 81 MG tablet Take 81 mg by mouth daily.    Marland Kitchen atorvastatin (LIPITOR) 40 MG tablet TAKE 1 TABLET BY MOUTH ONCE DAILY 90 tablet 0  . citalopram (CELEXA) 20 MG tablet Take 1 tablet (20 mg total) by mouth daily. 90 tablet 1  . metoprolol tartrate (LOPRESSOR) 100 MG tablet TAKE 1 TABLET BY MOUTH TWICE A DAY 180 tablet 0  . telmisartan (MICARDIS) 80 MG tablet TAKE 1 TABLET BY MOUTH ONCE DAILY 90 tablet 0  . HYDROcodone-homatropine (HYCODAN) 5-1.5 MG/5ML syrup Take 5 mLs by mouth every 8 (eight) hours as needed for cough. 120 mL 0  . terbinafine (LAMISIL) 250 MG tablet Take 1 tablet (250 mg total) by mouth daily. 30 tablet 1   No facility-administered medications prior to visit.     ROS Review of Systems    Constitutional: Negative.  Negative for appetite change, diaphoresis, fatigue and unexpected weight change.  HENT: Negative.   Eyes: Negative for visual disturbance.  Respiratory: Negative for cough, chest tightness, shortness of breath and wheezing.   Cardiovascular: Negative for chest pain and leg swelling.  Gastrointestinal: Negative for abdominal pain, blood in stool, constipation, diarrhea, nausea and vomiting.  Endocrine: Negative.   Genitourinary: Negative.  Negative for difficulty urinating, dysuria, flank pain, frequency, hematuria, penile swelling, scrotal swelling, testicular pain and urgency.  Musculoskeletal: Negative.  Negative for back pain and myalgias.  Skin: Negative.  Negative for color change.  Neurological: Negative.  Negative for dizziness, weakness and light-headedness.  Hematological: Negative for adenopathy. Does not bruise/bleed easily.  Psychiatric/Behavioral: Negative.     Objective:  BP 130/68 (BP Location: Left Arm, Patient Position: Sitting, Cuff Size: Large)   Pulse (!) 52   Temp 98.2 F (36.8 C) (Oral)   Resp 16   Ht 6\' 2"  (1.88 m)   Wt 238 lb 12 oz (108.3 kg)   SpO2 97%   BMI 30.65 kg/m   BP Readings from Last 3 Encounters:  07/05/18 130/68  06/06/18 138/80  06/15/17 (!) 142/84    Wt Readings from Last 3 Encounters:  07/05/18 238 lb 12 oz (108.3 kg)  06/06/18 248 lb (112.5 kg)  06/15/17 247 lb (112 kg)    Physical Exam Vitals signs  reviewed.  Constitutional:      Appearance: He is not ill-appearing or diaphoretic.  HENT:     Nose: Nose normal. No congestion or rhinorrhea.     Mouth/Throat:     Mouth: Mucous membranes are moist.     Pharynx: Oropharynx is clear. No oropharyngeal exudate.  Eyes:     General: No scleral icterus.    Conjunctiva/sclera: Conjunctivae normal.  Neck:     Musculoskeletal: Normal range of motion and neck supple.  Cardiovascular:     Rate and Rhythm: Normal rate and regular rhythm.     Heart sounds: No  murmur. No gallop.      Comments: EKG ---  Undetermined   Rhythm  BORDERLINE RHYTHM- no significant change compared to prior EKG  Pulmonary:     Effort: Pulmonary effort is normal.     Breath sounds: Normal breath sounds. No stridor. No wheezing, rhonchi or rales.  Abdominal:     General: Abdomen is flat.     Palpations: There is no hepatomegaly, splenomegaly or mass.     Tenderness: There is no abdominal tenderness. There is no guarding.     Hernia: There is no hernia in the right inguinal area or left inguinal area.  Genitourinary:    Pubic Area: No rash.      Penis: Normal and circumcised. No discharge, swelling or lesions.      Scrotum/Testes: Normal.        Right: Mass, tenderness or swelling not present.        Left: Mass, tenderness or swelling not present.     Epididymis:     Right: Normal. Not inflamed or enlarged. No mass or tenderness.     Left: Normal. Not inflamed or enlarged. No mass or tenderness.     Prostate: Enlarged (2+ BPH) and tender. No nodules present.     Rectum: Normal. Guaiac result negative. No mass, tenderness, anal fissure, external hemorrhoid or internal hemorrhoid. Normal anal tone.  Lymphadenopathy:     Lower Body: No right inguinal adenopathy. No left inguinal adenopathy.     Lab Results  Component Value Date   WBC 9.5 07/05/2018   HGB 14.3 07/05/2018   HCT 41.3 07/05/2018   PLT 239.0 07/05/2018   GLUCOSE 103 (H) 07/05/2018   CHOL 101 07/05/2018   TRIG 164.0 (H) 07/05/2018   HDL 28.30 (L) 07/05/2018   LDLDIRECT 60.0 06/13/2015   LDLCALC 40 07/05/2018   ALT 45 07/05/2018   AST 22 07/05/2018   NA 138 07/05/2018   K 4.7 07/05/2018   CL 108 07/05/2018   CREATININE 1.74 (H) 07/05/2018   BUN 44 (H) 07/05/2018   CO2 21 07/05/2018   TSH 4.03 07/05/2018   PSA 5.21 (H) 07/05/2018   HGBA1C 5.6 07/05/2018    Dg Chest 2 View  Result Date: 10/27/2013 CLINICAL DATA:  Congestion, shortness of Breath EXAM: CHEST  2 VIEW COMPARISON:  07/22/2011  FINDINGS: Cardiomediastinal silhouette is stable. No acute infiltrate or pleural effusion. No pulmonary edema. Mild elevation of the right hemidiaphragm again noted. Mild degenerative changes thoracic spine. IMPRESSION: No active cardiopulmonary disease. Electronically Signed   By: Lahoma Crocker M.D.   On: 10/27/2013 12:24    Assessment & Plan:   Ayham was seen today for annual exam and hypertension.  Diagnoses and all orders for this visit:  Essential hypertension- His blood pressure is adequately well controlled but he has mild, worsening renal insufficiency and prerenal azotemia.  I have therefore asked him to  stop taking the ARB.  He will also undergo a renal ultrasound to see if there is any concern for asymmetry or an obstructive process. -     CBC with Differential/Platelet; Future -     Comprehensive metabolic panel; Future -     TSH; Future -     Urinalysis, Routine w reflex microscopic; Future -     EKG 12-Lead -     metoprolol tartrate (LOPRESSOR) 50 MG tablet; Take 1 tablet (50 mg total) by mouth 2 (two) times daily.  Benign prostatic hyperplasia without lower urinary tract symptoms- His PSA has gone up some and there are white cells in his urine.  I think he should be treated for bacterial prostatitis.  Will recheck his PSA in about 3 months and if it continues to rise then will consider referring for evaluation of possible prostate cancer. -     PSA; Future  Kidney disease, chronic, stage III (GFR 30-59 ml/min) ([place])- See above. -     Comprehensive metabolic panel; Future -     Urinalysis, Routine w reflex microscopic; Future  Hyperglycemia- His blood sugar is mildly elevated.  Medical therapy is not indicated. -     Hemoglobin A1c; Future  Pure hypercholesterolemia- He has achieved his LDL goal and is doing well on the statin. 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  There is no discernible P wave on the EKG but his QRS complexes are regular.  I have asked him to decrease his metoprolol dose by 50% and to wear a 48-hour Holter monitor to try to identify what his underlying rhythm is and to see if there is any concerns for dysrhythmia or pauses. -     EKG 12-Lead -     Holter monitor - 48 hour; Future  Prostatitis, acute -     levofloxacin (LEVAQUIN) 500 MG tablet; Take 1 tablet (500 mg total) by mouth daily for 21 days.  Chronic renal disease, stage 3, moderately decreased glomerular filtration rate (GFR) between 30-59 mL/min/1.73 square meter ([place])- See above -     US Renal; Future   I have discontinued Felizardo Hoffmann. Motter's HYDROcodone-homatropine, terbinafine, telmisartan, and metoprolol tartrate. I am also having him start on metoprolol tartrate and levofloxacin. Additionally, I am having him maintain his aspirin, atorvastatin, and citalopram.  Meds ordered this encounter  Medications  . metoprolol tartrate (LOPRESSOR) 50 MG tablet    Sig: Take 1 tablet (50 mg total) by mouth 2 (two) times daily.    Dispense:  180 tablet    Refill:  0  . levofloxacin (LEVAQUIN) 500 MG tablet    Sig: Take 1 tablet (500 mg total) by mouth daily for 21 days.    Dispense:  21 tablet    Refill:  0   See AVS for instructions about healthy living and anticipatory guidance.  Follow-up: Return in about 3 months (around 10/05/2018).  Scarlette Calico, MD

## 2018-07-05 NOTE — Assessment & Plan Note (Signed)

## 2018-07-06 DIAGNOSIS — Z947 Corneal transplant status: Secondary | ICD-10-CM | POA: Diagnosis not present

## 2018-07-06 DIAGNOSIS — Z961 Presence of intraocular lens: Secondary | ICD-10-CM | POA: Diagnosis not present

## 2018-07-06 DIAGNOSIS — H1851 Endothelial corneal dystrophy: Secondary | ICD-10-CM | POA: Diagnosis not present

## 2018-07-06 DIAGNOSIS — Z9841 Cataract extraction status, right eye: Secondary | ICD-10-CM | POA: Diagnosis not present

## 2018-07-06 NOTE — Addendum Note (Signed)
Addended by: Karle Barr on: 07/06/2018 04:52 PM   Modules accepted: Orders

## 2018-07-07 ENCOUNTER — Ambulatory Visit (INDEPENDENT_AMBULATORY_CARE_PROVIDER_SITE_OTHER): Payer: Medicare Other

## 2018-07-07 DIAGNOSIS — R001 Bradycardia, unspecified: Secondary | ICD-10-CM | POA: Diagnosis not present

## 2018-07-16 ENCOUNTER — Other Ambulatory Visit: Payer: Self-pay | Admitting: Internal Medicine

## 2018-07-16 DIAGNOSIS — R001 Bradycardia, unspecified: Secondary | ICD-10-CM

## 2018-07-19 ENCOUNTER — Ambulatory Visit
Admission: RE | Admit: 2018-07-19 | Discharge: 2018-07-19 | Disposition: A | Discharge status: Home / Self Care | Payer: Medicare Other | Source: Ambulatory Visit | Attending: Internal Medicine | Admitting: Internal Medicine

## 2018-07-19 DIAGNOSIS — N183 Chronic kidney disease, stage 3 unspecified: Secondary | ICD-10-CM

## 2018-08-03 ENCOUNTER — Other Ambulatory Visit: Payer: Self-pay | Admitting: Internal Medicine

## 2018-08-03 DIAGNOSIS — I10 Essential (primary) hypertension: Secondary | ICD-10-CM

## 2018-08-03 MED ORDER — TELMISARTAN 40 MG PO TABS: 40.0000 mg | ORAL_TABLET | Freq: Every day | ORAL | 1 refills | Status: DC

## 2018-08-11 ENCOUNTER — Other Ambulatory Visit: Payer: Self-pay | Admitting: Internal Medicine

## 2018-08-15 ENCOUNTER — Other Ambulatory Visit: Payer: Self-pay

## 2018-08-15 ENCOUNTER — Telehealth (INDEPENDENT_AMBULATORY_CARE_PROVIDER_SITE_OTHER): Payer: Medicare Other | Admitting: Internal Medicine

## 2018-08-15 ENCOUNTER — Telehealth: Payer: Self-pay

## 2018-08-15 ENCOUNTER — Other Ambulatory Visit: Payer: Self-pay | Admitting: Internal Medicine

## 2018-08-15 DIAGNOSIS — E7800 Pure hypercholesterolemia, unspecified: Secondary | ICD-10-CM

## 2018-08-15 DIAGNOSIS — I442 Atrioventricular block, complete: Secondary | ICD-10-CM

## 2018-08-15 DIAGNOSIS — R9431 Abnormal electrocardiogram [ECG] [EKG]: Secondary | ICD-10-CM

## 2018-08-15 MED ORDER — ATORVASTATIN CALCIUM 40 MG PO TABS: 40.0000 mg | ORAL_TABLET | Freq: Every day | ORAL | 1 refills | Status: DC

## 2018-08-15 NOTE — Progress Notes (Signed)
See separate note.

## 2018-08-15 NOTE — Progress Notes (Signed)
Electrophysiology TeleHealth Note   Due to national recommendations of social distancing due to COVID 19, an audio/video telehealth visit is felt to be most appropriate for this patient at this time.  See MyChart message from today for the patient's consent to telehealth for [facility].   Date:  08/15/2018   ID:  Daniel Howe, DOB 08-15-1940, MRN [id number]  Location: patient's home  Provider location: [address] [place]  Evaluation Performed: Initial Evaluation  PCP:  Janith Lima, MD  Cardiologist:  No primary care provider on file.   Electrophysiologist:  None   Chief Complaint:  Abnormal holter  History of Present Illness:    Daniel Howe is a 78 y.o. male who presents via audio/video conferencing for a telehealth visit today for  Abnormal Holter monitor  Seen by his PCP, noted to have bradycardia.  Underwent Holter monitoring.  This was associated with a midday 3+ second pause associated with multiple nonconducted P waves.  There was concomitant PP prolongation suggestive of hyper vagotonia.  The patient had no symptoms of lightheadedness.  He has had no syncope/presyncope.  He denies exercise intolerance although his wife notes that his energy level is significantly less over the last 3-6 months.  ECGs were reviewed back to 2013.  At that time PR interval was normal.  Interval ECG 2018 demonstrated significant first-degree AV block with a PR interval of 400 ms.  This abnormality has persisted on subsequent ECGs.  Holter monitoring demonstrated PR interval of months of 500 ms.  An episode of junctional rhythm I suspect was in fact sinus with a hidden P wave.  Has known sleep apnea for which he uses CPAP at night.  He is prone to falling asleep in his chair during the day  Denies chest pain edema nocturnal dyspnea or orthopnea.   Date Cr K TSH Hgb  3/20 1.74 4.7 4.03. 14.3            The patient denies symptoms of fevers,  chills, cough, or new SOB worrisome for COVID 19.   Past Medical History:  Diagnosis Date  . Anxiety   . Depression   . Hypertension     Past Surgical History:  Procedure Laterality Date  . CATARACT EXTRACTION    . CORNEAL TRANSPLANT    . HERNIA REPAIR  03/09/12   lih repair  . INGUINAL HERNIA REPAIR  03/09/2012   Procedure: HERNIA REPAIR INGUINAL ADULT;  Surgeon: Pedro Earls, MD;  Location: [facility];  Service: General;  Laterality: Left;    Current Outpatient Medications  Medication Sig Dispense Refill  . atorvastatin (LIPITOR) 40 MG tablet TAKE 1 TABLET BY MOUTH ONCE DAILY 90 tablet 0  . citalopram (CELEXA) 20 MG tablet Take 1 tablet (20 mg total) by mouth daily. 90 tablet 1  . metoprolol tartrate (LOPRESSOR) 50 MG tablet Take 1 tablet (50 mg total) by mouth 2 (two) times daily. 180 tablet 0  . telmisartan (MICARDIS) 40 MG tablet Take 1 tablet (40 mg total) by mouth daily. 90 tablet 1   No current facility-administered medications for this visit.     Allergies:   Patient has no known allergies.   Social History:  The patient  reports that he has never smoked. He has never used smokeless tobacco. He reports current alcohol use of about 14.0 standard drinks of alcohol per week. He reports that he does not use drugs.   Family History:  The patient's  family history includes Diabetes in his brother; Heart disease in his brother.   ROS:  Please see the history of present illness.   All other systems are personally reviewed and negative.    Exam:    Vital Signs:  There were no vitals taken for this visit.   Well appearing, alert and conversant, regular work of breathing,  good skin color Eyes- anicteric, neuro- grossly intact, skin- no apparent rash or lesions or cyanosis, mouth- oral mucosa is pink   Labs/Other Tests and Data Reviewed:    Recent Labs: 07/05/2018: ALT 45; BUN 44; Creatinine, Ser 1.74; Hemoglobin 14.3; Platelets 239.0; Potassium 4.7; Sodium 138; TSH 4.03    Wt Readings from Last 3 Encounters:  07/05/18 238 lb 12 oz (108.3 kg)  06/06/18 248 lb (112.5 kg)  06/15/17 247 lb (112 kg)     Other studies personally reviewed: Additional studies/ records that were reviewed today include: *ECG Review of the above records today demonstrates: As above    ASSESSMENT & PLAN:    First-degree AV block  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  Has developed in the preceding 5 years; not associated particularly with notable symptoms although some of his exercise tolerance of late may be is attributable  No symptoms of lightheadedness to suggest daytime high-grade heart block and suspect the event noted occurred during sleep as consistent with hyper vagotonia.  He is alerted to let us know if he has any symptoms of lightheadedness which would prompt an urgent need for pacing given the high-grade heart block.  Otherwise, post COVID-19 would anticipate further evaluation for exercise intolerance and consideration of pacing      COVID 19 screen The patient denies symptoms of COVID 19 at this time.  The importance of social distancing was discussed today.  Follow-up:  24m Next remote: N/A  Current medicines are reviewed at length with the patient today.   The patient does not have concerns regarding his medicines.  The following changes were made today:  none  Labs/ tests ordered today include:   No orders of the defined types were placed in this encounter.   Future tests ( post COVID )     Patient Risk:  after full review of this patients clinical status, I feel that they are at moderate risk at this time.  Today, I have spent33  minutes with the patient with telehealth technology discussing As above  .    Signed, Virl Axe, MD  08/15/2018 12:21 PM      [place] [address] [phone] (office) [phone] (fax)

## 2018-08-15 NOTE — Telephone Encounter (Signed)
Spoke with pt's wife regarding pt's virtual visit with Dr Caryl Comes. She had no questions. Follow up appt was also made.

## 2018-08-19 ENCOUNTER — Telehealth: Payer: Self-pay | Admitting: Internal Medicine

## 2018-08-19 ENCOUNTER — Ambulatory Visit: Payer: Medicare Other | Admitting: Cardiology

## 2018-08-19 NOTE — Telephone Encounter (Signed)
Copied from [place] [id number]. Topic: Quick Communication - See Telephone Encounter >> Aug 19, 2018 10:05 AM Blase Mess A wrote: CRM for notification. See Telephone encounter for: 08/19/18. Danielle from [employer] is calling regarding telmisartan (MICARDIS) 40 MG tablet [[id number]] Patient is under the assumption that he is to take a 40mg  and an 80mg . Please advise. [facility] - [place], [place] [address] AT [place] [phone] (Phone) [phone] (Fax)

## 2018-08-21 NOTE — Telephone Encounter (Signed)
No, his dose has been decreased to 40 mg QD  TJ

## 2018-08-22 ENCOUNTER — Other Ambulatory Visit: Payer: Self-pay | Admitting: Internal Medicine

## 2018-08-22 DIAGNOSIS — I10 Essential (primary) hypertension: Secondary | ICD-10-CM

## 2018-08-22 MED ORDER — METOPROLOL TARTRATE 50 MG PO TABS: 50.0000 mg | ORAL_TABLET | Freq: Two times a day (BID) | ORAL | 0 refills | Status: DC

## 2018-10-10 ENCOUNTER — Other Ambulatory Visit: Payer: Self-pay | Admitting: Internal Medicine

## 2018-10-10 DIAGNOSIS — N401 Enlarged prostate with lower urinary tract symptoms: Secondary | ICD-10-CM

## 2018-10-20 ENCOUNTER — Telehealth (INDEPENDENT_AMBULATORY_CARE_PROVIDER_SITE_OTHER): Payer: Medicare Other | Admitting: Internal Medicine

## 2018-10-20 ENCOUNTER — Other Ambulatory Visit: Payer: Self-pay

## 2018-10-20 ENCOUNTER — Encounter: Payer: Self-pay | Admitting: Internal Medicine

## 2018-10-20 VITALS — BP 128/76 | HR 61 | Ht 74.0 in | Wt 239.0 lb

## 2018-10-20 DIAGNOSIS — I442 Atrioventricular block, complete: Secondary | ICD-10-CM | POA: Diagnosis not present

## 2018-10-20 DIAGNOSIS — G522 Disorders of vagus nerve: Secondary | ICD-10-CM | POA: Diagnosis not present

## 2018-10-20 DIAGNOSIS — R9431 Abnormal electrocardiogram [ECG] [EKG]: Secondary | ICD-10-CM | POA: Diagnosis not present

## 2018-10-20 NOTE — Progress Notes (Signed)
Electrophysiology TeleHealth Note   Due to national recommendations of social distancing due to COVID 19, an audio/video telehealth visit is felt to be most appropriate for this patient at this time.  See MyChart message from today for the patient's consent to telehealth for [facility].   Date:  10/20/2018   ID:  Daniel Howe, DOB November 20, 1940, MRN [id number]  Location: patient's home  Provider location: [address] [place]  Evaluation Performed: Follow-up visit  PCP:  Janith Lima, MD  Cardiologist:   Electrophysiologist:  SK   Chief Complaint:  hypervagotonia and heart block   History of Present Illness:    Daniel Howe is a 78 y.o. male who presents via audio/video conferencing for a telehealth visit today.  Since last being seen in our clinic  the patient reports doing well without episodes of LH  Also without chest pain, SOB edema or palpitations    The patient denies symptoms of fevers, chills, cough, or new SOB worrisome for COVID 19.    Past Medical History:  Diagnosis Date  . Anxiety   . Depression   . Hypertension     Past Surgical History:  Procedure Laterality Date  . CATARACT EXTRACTION    . CORNEAL TRANSPLANT    . HERNIA REPAIR  03/09/12   lih repair  . INGUINAL HERNIA REPAIR  03/09/2012   Procedure: HERNIA REPAIR INGUINAL ADULT;  Surgeon: Pedro Earls, MD;  Location: [facility];  Service: General;  Laterality: Left;    Current Outpatient Medications  Medication Sig Dispense Refill  . atorvastatin (LIPITOR) 40 MG tablet Take 1 tablet (40 mg total) by mouth daily. 90 tablet 1  . citalopram (CELEXA) 20 MG tablet Take 1 tablet (20 mg total) by mouth daily. 90 tablet 1  . metoprolol tartrate (LOPRESSOR) 50 MG tablet Take 1 tablet (50 mg total) by mouth 2 (two) times daily. 180 tablet 0  . telmisartan (MICARDIS) 40 MG tablet Take 1 tablet (40 mg total) by mouth daily. 90 tablet 1   No current facility-administered  medications for this visit.     Allergies:   Patient has no known allergies.   Social History:  The patient  reports that he has never smoked. He has never used smokeless tobacco. He reports current alcohol use of about 14.0 standard drinks of alcohol per week. He reports that he does not use drugs.   Family History:  The patient's   family history includes Diabetes in his brother; Heart disease in his brother.   ROS:  Please see the history of present illness.   All other systems are personally reviewed and negative.    Exam:    Vital Signs:  BP 128/76 (BP Location: Right Arm, Patient Position: Sitting) Comment: Taken at 11AM  Pulse 61   Ht 6\' 2"  (1.88 m)   Wt 239 lb (108.4 kg)   BMI 30.69 kg/m     Well appearing, alert and conversant, regular work of breathing,  good skin color Eyes- anicteric, neuro- grossly intact, skin- no apparent rash or lesions or cyanosis, mouth- oral mucosa is pink   Labs/Other Tests and Data Reviewed:    Recent Labs: 07/05/2018: ALT 45; BUN 44; Creatinine, Ser 1.74; Hemoglobin 14.3; Platelets 239.0; Potassium 4.7; Sodium 138; TSH 4.03  Personally reviewed     Wt Readings from Last 3 Encounters:  10/20/18 239 lb (108.4 kg)  07/05/18 238 lb 12 oz (108.3 kg)  06/06/18 248 lb (  112.5 kg)     Other studies personally reviewed: Additional studies/ records that were reviewed today include:As above    ASSESSMENT & PLAN:    First-degree AV block  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  The importance of social distancing was discussed today.  Follow-up:  As above     Current medicines are reviewed at length with the patient today.   The patient does not have concerns regarding his medicines.  The  following changes were made today:  none  Labs/ tests ordered today include:   No orders of the defined types were placed in this encounter.   Future tests ( post COVID )     Patient Risk:  after full review of this patients clinical status, I feel that they are at moderate  risk at this time.  Today, I have spent 10  minutes with the patient with telehealth technology discussing the above.  Signed, Virl Axe, MD  10/20/2018 2:11 PM     [address] [place] [address] [phone] (office) [phone] (fax)

## 2018-10-20 NOTE — Patient Instructions (Signed)
Medication Instructions:  Your physician recommends that you continue on your current medications as directed. Please refer to the Current Medication list given to you today.   Labwork: None ordered.   Testing/Procedures: None ordered.   Follow-Up: Your physician recommends that you schedule a follow-up appointment in: 4 months with Dr Caryl Comes   Any Other Special Instructions Will Be Listed Below (If Applicable).     If you need a refill on your cardiac medications before your next appointment, please call your pharmacy.

## 2018-11-19 ENCOUNTER — Other Ambulatory Visit: Payer: Self-pay | Admitting: Internal Medicine

## 2018-11-19 DIAGNOSIS — I10 Essential (primary) hypertension: Secondary | ICD-10-CM

## 2018-11-19 MED ORDER — METOPROLOL TARTRATE 50 MG PO TABS: 50.0000 mg | ORAL_TABLET | Freq: Two times a day (BID) | ORAL | 0 refills | Status: DC

## 2018-11-30 ENCOUNTER — Other Ambulatory Visit: Payer: Self-pay | Admitting: Internal Medicine

## 2018-11-30 DIAGNOSIS — F32A Depression, unspecified: Secondary | ICD-10-CM

## 2018-11-30 DIAGNOSIS — F329 Major depressive disorder, single episode, unspecified: Secondary | ICD-10-CM

## 2018-11-30 MED ORDER — CITALOPRAM HYDROBROMIDE 20 MG PO TABS: 20.0000 mg | ORAL_TABLET | Freq: Every day | ORAL | 1 refills | Status: DC

## 2018-12-08 DIAGNOSIS — G4733 Obstructive sleep apnea (adult) (pediatric): Secondary | ICD-10-CM | POA: Diagnosis not present

## 2018-12-13 DIAGNOSIS — G4733 Obstructive sleep apnea (adult) (pediatric): Secondary | ICD-10-CM | POA: Diagnosis not present

## 2018-12-27 ENCOUNTER — Other Ambulatory Visit: Payer: Medicare Other

## 2018-12-27 DIAGNOSIS — R3912 Poor urinary stream: Secondary | ICD-10-CM | POA: Diagnosis not present

## 2018-12-27 DIAGNOSIS — N401 Enlarged prostate with lower urinary tract symptoms: Secondary | ICD-10-CM

## 2018-12-28 LAB — PSA: PSA: 5.7

## 2018-12-28 LAB — PSA, TOTAL AND FREE
PSA, % Free: 33 % (calc) (ref >25)
PSA, Free: 1.9 ng/mL
PSA, Total: 5.7 ng/mL — ABNORMAL HIGH (ref <=4.0)

## 2018-12-29 DIAGNOSIS — Z85828 Personal history of other malignant neoplasm of skin: Secondary | ICD-10-CM | POA: Diagnosis not present

## 2018-12-29 DIAGNOSIS — D2372 Other benign neoplasm of skin of left lower limb, including hip: Secondary | ICD-10-CM | POA: Diagnosis not present

## 2018-12-29 DIAGNOSIS — D1801 Hemangioma of skin and subcutaneous tissue: Secondary | ICD-10-CM | POA: Diagnosis not present

## 2018-12-29 DIAGNOSIS — L570 Actinic keratosis: Secondary | ICD-10-CM | POA: Diagnosis not present

## 2018-12-29 DIAGNOSIS — D485 Neoplasm of uncertain behavior of skin: Secondary | ICD-10-CM | POA: Diagnosis not present

## 2018-12-29 DIAGNOSIS — L821 Other seborrheic keratosis: Secondary | ICD-10-CM | POA: Diagnosis not present

## 2019-01-16 ENCOUNTER — Other Ambulatory Visit: Payer: Self-pay | Admitting: Internal Medicine

## 2019-01-16 DIAGNOSIS — E7800 Pure hypercholesterolemia, unspecified: Secondary | ICD-10-CM

## 2019-01-16 MED ORDER — ATORVASTATIN CALCIUM 40 MG PO TABS: 40.0000 mg | ORAL_TABLET | Freq: Every day | ORAL | 1 refills | Status: DC

## 2019-01-26 ENCOUNTER — Other Ambulatory Visit: Payer: Self-pay | Admitting: Internal Medicine

## 2019-01-26 DIAGNOSIS — I10 Essential (primary) hypertension: Secondary | ICD-10-CM

## 2019-01-26 MED ORDER — TELMISARTAN 40 MG PO TABS: 40.0000 mg | ORAL_TABLET | Freq: Every day | ORAL | 0 refills | Status: DC

## 2019-02-17 ENCOUNTER — Other Ambulatory Visit: Payer: Self-pay | Admitting: Internal Medicine

## 2019-02-17 DIAGNOSIS — I10 Essential (primary) hypertension: Secondary | ICD-10-CM

## 2019-02-17 NOTE — Telephone Encounter (Signed)
Medication Refill - Medication: metoprolol tartrate (LOPRESSOR) 50 MG tablet  quantity 180   Has the patient contacted their pharmacy? Yes.   (Agent: If no, request that the patient contact the pharmacy for the refill.) (Agent: If yes, when and what did the pharmacy advise?)  Preferred Pharmacy (with phone number or street name):  [id number] - [place], [address]  [place] [address]  Phone: [phone] Fax: [phone]     Agent: Please be advised that RX refills may take up to 3 business days. We ask that you follow-up with your pharmacy.

## 2019-02-17 NOTE — Telephone Encounter (Signed)
Requested medication (s) are due for refill today: yes  Requested medication (s) are on the active medication list: yes  Last refill:  11/19/2018  Future visit scheduled: yes  Notes to clinic:  Overdue for office visit    Requested Prescriptions  Pending Prescriptions Disp Refills   metoprolol tartrate (LOPRESSOR) 50 MG tablet 180 tablet 0    Sig: Take 1 tablet (50 mg total) by mouth 2 (two) times daily.     Cardiovascular:  Beta Blockers Failed - 02/17/2019  1:14 PM      Failed - Valid encounter within last 6 months    Recent Outpatient Visits          7 months ago Essential hypertension   [place], Thomas L, MD   8 months ago Encounter for post-traumatic wound check   [place], Thomas L, MD   1 year ago Hearing loss due to cerumen impaction, left   [place], Thomas L, MD   2 years ago Essential hypertension   [place], Thomas L, MD   3 years ago Essential hypertension   [place], MD      Future Appointments            In 1 month Deboraha Sprang, MD [place], [facility]           Passed - Last BP in normal range    BP Readings from Last 1 Encounters:  10/20/18 128/76         Passed - Last Heart Rate in normal range    Pulse Readings from Last 1 Encounters:  10/20/18 61

## 2019-02-20 MED ORDER — METOPROLOL TARTRATE 50 MG PO TABS: 50.0000 mg | ORAL_TABLET | Freq: Two times a day (BID) | ORAL | 0 refills | Status: DC

## 2019-02-27 ENCOUNTER — Ambulatory Visit: Payer: Medicare Other | Admitting: Internal Medicine

## 2019-03-22 ENCOUNTER — Other Ambulatory Visit: Payer: Self-pay | Admitting: Internal Medicine

## 2019-03-22 DIAGNOSIS — I10 Essential (primary) hypertension: Secondary | ICD-10-CM

## 2019-03-22 MED ORDER — METOPROLOL TARTRATE 50 MG PO TABS: 50.0000 mg | ORAL_TABLET | Freq: Two times a day (BID) | ORAL | 0 refills | Status: DC

## 2019-04-06 ENCOUNTER — Ambulatory Visit: Payer: Medicare Other | Admitting: Internal Medicine

## 2019-04-12 ENCOUNTER — Encounter: Payer: Self-pay | Admitting: Internal Medicine

## 2019-04-12 ENCOUNTER — Other Ambulatory Visit: Payer: Self-pay

## 2019-04-12 ENCOUNTER — Ambulatory Visit: Payer: Medicare Other | Admitting: Internal Medicine

## 2019-04-12 VITALS — BP 130/70 | HR 69 | Ht 74.0 in | Wt 245.8 lb

## 2019-04-12 DIAGNOSIS — I442 Atrioventricular block, complete: Secondary | ICD-10-CM | POA: Diagnosis not present

## 2019-04-12 LAB — BASIC METABOLIC PANEL
BUN/Creatinine Ratio: 19 (ref 10–24)
BUN: 37 mg/dL — ABNORMAL HIGH (ref 8–27)
CO2: 20 mmol/L (ref 20–29)
Calcium: 9.2 mg/dL (ref 8.6–10.2)
Chloride: 105 mmol/L (ref 96–106)
Creatinine, Ser: 1.93 mg/dL — ABNORMAL HIGH (ref 0.76–1.27)
GFR calc Af Amer: 38 mL/min/{1.73_m2} — ABNORMAL LOW (ref >59)
GFR calc non Af Amer: 33 mL/min/{1.73_m2} — ABNORMAL LOW (ref >59)
Glucose: 87 mg/dL (ref 65–99)
Potassium: 4.7 mmol/L (ref 3.5–5.2)
Sodium: 139 mmol/L (ref 134–144)

## 2019-04-12 NOTE — Progress Notes (Signed)
Patient Care Team: Janith Lima, MD as PCP - General (Internal Medicine)   HPI  Daniel Howe is a 78 y.o. male Seen for the first time after 2 telehealth visits earlier this year in follow-up of an abnormal Holter monitor undertaken by his PCP for bradycardia.  It was personally reviewed.  He had 3+ second pauses during the day with multiple nonconducted P waves.  There was concomitant PP prolongation suggestive of hyper vagotonia.  No associated symptoms.  Date PR Interval  6/13 242  5/18 500(about)  3/20 540  12/20 >600 ? junctional   HolterLongAVblock >> 300>>581msec   Date Cr K TSH Hgb  3/20 1.74 4.7 4.03 14.3          The patient denies chest pain, shortness of breath, nocturnal dyspnea, orthopnea or peripheral edema.  There have been no palpitations, lightheadedness or syncope.    Records and Results Reviewed *  Past Medical History:  Diagnosis Date  . Anxiety   . Depression   . Hypertension     Past Surgical History:  Procedure Laterality Date  . CATARACT EXTRACTION    . CORNEAL TRANSPLANT    . HERNIA REPAIR  03/09/12   lih repair  . INGUINAL HERNIA REPAIR  03/09/2012   Procedure: HERNIA REPAIR INGUINAL ADULT;  Surgeon: Pedro Earls, MD;  Location: [facility];  Service: General;  Laterality: Left;    Current Meds  Medication Sig  . atorvastatin (LIPITOR) 40 MG tablet Take 1 tablet (40 mg total) by mouth daily.  . citalopram (CELEXA) 20 MG tablet Take 1 tablet (20 mg total) by mouth daily.  . metoprolol tartrate (LOPRESSOR) 50 MG tablet Take 1 tablet (50 mg total) by mouth 2 (two) times daily.  Marland Kitchen telmisartan (MICARDIS) 40 MG tablet Take 1 tablet (40 mg total) by mouth daily.    No Known Allergies    Review of Systems negative except from HPI and PMH  Physical Exam BP 130/70   Pulse 69   Ht 6\' 2"  (1.88 m)   Wt 245 lb 12.8 oz (111.5 kg)   SpO2 98%   BMI 31.56 kg/m  Well developed and well nourished in no acute distress HENT normal  E scleral and icterus clear Neck Supple JVP flat; carotids brisk and full Clear to ausculation Regular rate and rhythm, quiet S1no murmurs gallops or rub Soft with active bowel sounds No clubbing cyanosis } Edema Alert and oriented, grossly normal motor and sensory function Skin Warm and Dry  ECG junctional rhythm vs sinus w profound 1AVB -/09/40  Assessment and  Plan   First-degree AV block profound    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  Prolonged first-degree AV block and some higher grade heart block associated with nonconducted pairs of P waves.  He has no attributable symptoms.  We reviewed extensively symptoms of lightheadedness and might be associate with a pause and symptoms of exercise intolerance that might be associated with profound first-degree AV block with concomitant atrial and ventricular activation.  He denies both.  We will plan to follow along.  We spent more than 50% of our >25 min visit in face to face counseling regarding the above   F/u 67m   Dr. Ronnald Ramp may well be right that his beta-blockers are contributing to his AV nodal conduction  slowing; hence, we will wean him off 50 mg twice daily--25 mg twice daily x5 days and 25 mg daily x5 days.  He will let us know what his blood pressure is in 6 weeks or sooner if he gets to be greater than 160/90.    Current medicines are reviewed at length with the patient today .  The patient does not  have concerns regarding medicines.

## 2019-04-12 NOTE — Patient Instructions (Signed)
Medication Instructions:  Your physician has recommended you make the following change in your medication: Take Metoprolol 25 mg (1/2) tablet twice daily x 5 days then 25 mg (1/2) tablet daily x 5 days then stop.     Labwork: BMET today   Testing/Procedures: None ordered.   Follow-Up: In 6 weeks please send [place] your B/P through My Chart. Follow up in 6 months with Dr Caryl Comes.  Any Other Special Instructions Will Be Listed Below (If Applicable).     If you need a refill on your cardiac medications before your next appointment, please call your pharmacy.

## 2019-04-18 ENCOUNTER — Telehealth: Payer: Self-pay | Admitting: Internal Medicine

## 2019-04-18 NOTE — Telephone Encounter (Signed)
Patient returning call for results 

## 2019-04-18 NOTE — Telephone Encounter (Signed)
Pt notified of lab results

## 2019-04-18 NOTE — Telephone Encounter (Signed)
Attempted to call the patient. No answer- I left a message to call back to the [place] office.

## 2019-04-18 NOTE — Telephone Encounter (Signed)
Deboraha Sprang, MD  04/17/2019 7:44 AM EST    Please Inform Patient  Labs are normal x *mild worsening or renal function   Thanks

## 2019-04-24 ENCOUNTER — Other Ambulatory Visit: Payer: Self-pay | Admitting: Internal Medicine

## 2019-04-24 DIAGNOSIS — I10 Essential (primary) hypertension: Secondary | ICD-10-CM

## 2019-04-24 MED ORDER — TELMISARTAN 40 MG PO TABS: 40.0000 mg | ORAL_TABLET | Freq: Every day | ORAL | 0 refills | Status: DC

## 2019-05-27 ENCOUNTER — Other Ambulatory Visit: Payer: Self-pay | Admitting: Internal Medicine

## 2019-05-27 DIAGNOSIS — F329 Major depressive disorder, single episode, unspecified: Secondary | ICD-10-CM

## 2019-05-27 DIAGNOSIS — F32A Depression, unspecified: Secondary | ICD-10-CM

## 2019-07-13 ENCOUNTER — Other Ambulatory Visit: Payer: Self-pay | Admitting: Internal Medicine

## 2019-07-13 DIAGNOSIS — E7800 Pure hypercholesterolemia, unspecified: Secondary | ICD-10-CM

## 2019-07-17 ENCOUNTER — Telehealth: Payer: Self-pay

## 2019-07-17 DIAGNOSIS — I10 Essential (primary) hypertension: Secondary | ICD-10-CM

## 2019-07-17 DIAGNOSIS — E7800 Pure hypercholesterolemia, unspecified: Secondary | ICD-10-CM

## 2019-07-17 NOTE — Telephone Encounter (Signed)
1.Medication Requested:telmisartan (MICARDIS) 40 MG tablet  atorvastatin (LIPITOR) 40 MG tablet  2. Pharmacy (Name, Street, [place]):[id number] - [place], [address] AT [place]  3. On Med List: Yes   4. Last Visit with PCP: 3.3.20  5. Next visit date with PCP: 4.5.21    Agent: Please be advised that RX refills may take up to 3 business days. We ask that you follow-up with your pharmacy.

## 2019-07-18 ENCOUNTER — Other Ambulatory Visit: Payer: Self-pay | Admitting: Internal Medicine

## 2019-07-18 DIAGNOSIS — E7800 Pure hypercholesterolemia, unspecified: Secondary | ICD-10-CM

## 2019-07-18 DIAGNOSIS — I10 Essential (primary) hypertension: Secondary | ICD-10-CM

## 2019-07-18 MED ORDER — TELMISARTAN 40 MG PO TABS: 40.0000 mg | ORAL_TABLET | Freq: Every day | ORAL | 0 refills | Status: DC

## 2019-07-18 MED ORDER — ATORVASTATIN CALCIUM 40 MG PO TABS: 40.0000 mg | ORAL_TABLET | Freq: Every day | ORAL | 0 refills | Status: DC

## 2019-07-18 NOTE — Telephone Encounter (Signed)
30 day erx sent. Absolutely No More Refills Without Appointment

## 2019-07-31 ENCOUNTER — Other Ambulatory Visit: Payer: Self-pay | Admitting: Internal Medicine

## 2019-08-07 ENCOUNTER — Other Ambulatory Visit: Payer: Self-pay

## 2019-08-07 ENCOUNTER — Encounter: Payer: Self-pay | Admitting: Internal Medicine

## 2019-08-07 ENCOUNTER — Ambulatory Visit (INDEPENDENT_AMBULATORY_CARE_PROVIDER_SITE_OTHER): Payer: Medicare Other | Admitting: Internal Medicine

## 2019-08-07 VITALS — BP 136/74 | HR 85 | Temp 98.0°F | Resp 16 | Wt 246.0 lb

## 2019-08-07 DIAGNOSIS — N401 Enlarged prostate with lower urinary tract symptoms: Secondary | ICD-10-CM | POA: Diagnosis not present

## 2019-08-07 DIAGNOSIS — R972 Elevated prostate specific antigen [PSA]: Secondary | ICD-10-CM

## 2019-08-07 DIAGNOSIS — H6123 Impacted cerumen, bilateral: Secondary | ICD-10-CM | POA: Diagnosis not present

## 2019-08-07 DIAGNOSIS — I441 Atrioventricular block, second degree: Secondary | ICD-10-CM

## 2019-08-07 DIAGNOSIS — E7800 Pure hypercholesterolemia, unspecified: Secondary | ICD-10-CM | POA: Diagnosis not present

## 2019-08-07 DIAGNOSIS — R3912 Poor urinary stream: Secondary | ICD-10-CM

## 2019-08-07 DIAGNOSIS — Z0000 Encounter for general adult medical examination without abnormal findings: Secondary | ICD-10-CM | POA: Diagnosis not present

## 2019-08-07 DIAGNOSIS — N1831 Chronic kidney disease, stage 3a: Secondary | ICD-10-CM

## 2019-08-07 DIAGNOSIS — E781 Pure hyperglyceridemia: Secondary | ICD-10-CM

## 2019-08-07 DIAGNOSIS — I10 Essential (primary) hypertension: Secondary | ICD-10-CM | POA: Diagnosis not present

## 2019-08-07 LAB — URINALYSIS, ROUTINE W REFLEX MICROSCOPIC
Bilirubin Urine: NEGATIVE
Hgb urine dipstick: NEGATIVE
Ketones, ur: NEGATIVE
Leukocytes,Ua: NEGATIVE
Nitrite: NEGATIVE
RBC / HPF: NONE SEEN (ref 0–?)
Specific Gravity, Urine: 1.025 (ref 1.000–1.030)
Total Protein, Urine: NEGATIVE
Urine Glucose: NEGATIVE
Urobilinogen, UA: 0.2 (ref 0.0–1.0)
pH: 5.5 (ref 5.0–8.0)

## 2019-08-07 LAB — BASIC METABOLIC PANEL
BUN: 30 mg/dL — ABNORMAL HIGH (ref 6–23)
CO2: 23 mEq/L (ref 19–32)
Calcium: 8.8 mg/dL (ref 8.4–10.5)
Chloride: 104 mEq/L (ref 96–112)
Creatinine, Ser: 1.62 mg/dL — ABNORMAL HIGH (ref 0.40–1.50)
GFR: 41.58 mL/min — ABNORMAL LOW (ref >60.00)
Glucose, Bld: 118 mg/dL — ABNORMAL HIGH (ref 70–99)
Potassium: 4.2 mEq/L (ref 3.5–5.1)
Sodium: 136 mEq/L (ref 135–145)

## 2019-08-07 LAB — TSH: TSH: 4.32 u[IU]/mL (ref 0.35–4.50)

## 2019-08-07 LAB — LIPID PANEL
Cholesterol: 100 mg/dL (ref 0–200)
HDL: 37 mg/dL — ABNORMAL LOW (ref >39.00)
LDL Cholesterol: 40 mg/dL (ref 0–99)
NonHDL: 62.68
Total CHOL/HDL Ratio: 3
Triglycerides: 112 mg/dL (ref 0.0–149.0)
VLDL: 22.4 mg/dL (ref 0.0–40.0)

## 2019-08-07 NOTE — Progress Notes (Signed)
Patient consent obtained. Irrigation with water and peroxide performed. Full view of bilateral tympanic membranes after procedure.  Patient tolerated procedure well.   

## 2019-08-07 NOTE — Progress Notes (Addendum)
 Subjective:  Patient ID: Daniel Howe, male    DOB: 12/19/1940  Age: 79 y.o. MRN: [id number]  CC: Annual Exam, Hyperlipidemia, and Hypertension   This visit occurred during the SARS-CoV-2 public health emergency.  Safety protocols were in place, including screening questions prior to the visit, additional usage of staff PPE, and extensive cleaning of exam room while observing appropriate contact time as indicated for disinfecting solutions.    HPI Daniel Howe presents for a CPX.  He is active and denies any recent episodes of near syncope, palpitations, dizziness, lightheadedness, edema or fatigue. He has tapered off the Beta-blocker.  He complains of decreased LOH.   Outpatient Medications Prior to Visit  Medication Sig Dispense Refill  . atorvastatin (LIPITOR) 40 MG tablet TAKE 1 TABLET(40 MG) BY MOUTH DAILY 90 tablet 0  . citalopram (CELEXA) 20 MG tablet TAKE 1 TABLET(20 MG) BY MOUTH DAILY 90 tablet 1  . telmisartan (MICARDIS) 40 MG tablet TAKE 1 TABLET(40 MG) BY MOUTH DAILY 90 tablet 0  . metoprolol tartrate (LOPRESSOR) 50 MG tablet Take 1 tablet (50 mg total) by mouth 2 (two) times daily. 180 tablet 0   No facility-administered medications prior to visit.    ROS Review of Systems  Constitutional: Positive for unexpected weight change (wt gain). Negative for appetite change, chills, diaphoresis and fatigue.  HENT: Positive for hearing loss. Negative for ear discharge and ear pain.   Eyes: Negative.  Negative for visual disturbance.  Respiratory: Negative for chest tightness, shortness of breath and wheezing.   Cardiovascular: Negative for chest pain, palpitations and leg swelling.  Gastrointestinal: Negative for abdominal pain, constipation, diarrhea, nausea and vomiting.  Endocrine: Negative.   Genitourinary: Positive for difficulty urinating. Negative for dysuria.       Straining and urinary hesitancy  Musculoskeletal: Negative.  Negative for arthralgias and  myalgias.  Skin: Negative.   Neurological: Negative.  Negative for dizziness, syncope, weakness and light-headedness.  Hematological: Negative for adenopathy. Does not bruise/bleed easily.  Psychiatric/Behavioral: Negative.  Negative for behavioral problems, dysphoric mood, hallucinations and sleep disturbance.    Objective:  BP 136/74 (BP Location: Left Arm, Patient Position: Sitting, Cuff Size: Large)   Pulse 85   Temp 98 F (36.7 C) (Oral)   Resp 16   Wt 246 lb (111.6 kg)   SpO2 98%   BMI 31.58 kg/m   BP Readings from Last 3 Encounters:  08/07/19 136/74  04/12/19 130/70  10/20/18 128/76    Wt Readings from Last 3 Encounters:  08/07/19 246 lb (111.6 kg)  04/12/19 245 lb 12.8 oz (111.5 kg)  10/20/18 239 lb (108.4 kg)    Physical Exam Vitals reviewed.  Constitutional:      Appearance: He is obese.  HENT:     Right Ear: Decreased hearing noted. No tenderness. No middle ear effusion. There is impacted cerumen. Tympanic membrane is not injected.     Left Ear: Decreased hearing noted. No tenderness.  No middle ear effusion. There is impacted cerumen. Tympanic membrane is not injected.     Ears:     Comments: Both ears were irrigated    Nose: Nose normal.     Mouth/Throat:     Mouth: Mucous membranes are moist.  Eyes:     General: No scleral icterus.    Conjunctiva/sclera: Conjunctivae normal.  Cardiovascular:     Rate and Rhythm: Normal rate. Rhythm irregularly irregular.     Heart sounds: No murmur. No gallop.        Comments: EKG - Irregular rhythm, 71 bpm Otherwise normal EKG and no change from the prior EKG Pulmonary:     Effort: Pulmonary effort is normal.     Breath sounds: No stridor. No wheezing, rhonchi or rales.  Abdominal:     General: Abdomen is protuberant. Bowel sounds are normal. There is no distension.     Palpations: Abdomen is soft. There is no hepatomegaly, splenomegaly or mass.     Tenderness: There is no abdominal tenderness.  Musculoskeletal:         General: No swelling.     Right lower leg: No edema.     Left lower leg: No edema.  Skin:    General: Skin is warm and dry.     Coloration: Skin is not pale.  Neurological:     General: No focal deficit present.     Mental Status: He is alert and oriented to person, place, and time. Mental status is at baseline.  Psychiatric:        Mood and Affect: Mood normal.        Behavior: Behavior normal.        Thought Content: Thought content normal.        Judgment: Judgment normal.     Lab Results  Component Value Date   WBC 9.5 07/05/2018   HGB 14.3 07/05/2018   HCT 41.3 07/05/2018   PLT 239.0 07/05/2018   GLUCOSE 118 (H) 08/07/2019   CHOL 100 08/07/2019   TRIG 112.0 08/07/2019   HDL 37.00 (L) 08/07/2019   LDLDIRECT 60.0 06/13/2015   LDLCALC 40 08/07/2019   ALT 45 07/05/2018   AST 22 07/05/2018   NA 136 08/07/2019   K 4.2 08/07/2019   CL 104 08/07/2019   CREATININE 1.62 (H) 08/07/2019   BUN 30 (H) 08/07/2019   CO2 23 08/07/2019   TSH 4.32 08/07/2019   PSA 5.7 12/28/2018   HGBA1C 5.6 07/05/2018    US Renal  Result Date: 07/19/2018 CLINICAL DATA:  79 year old male with renal insufficiency. Initial encounter. EXAM: RENAL / URINARY TRACT ULTRASOUND COMPLETE COMPARISON:  None. FINDINGS: Right Kidney: Renal measurements: 11.1 x 5 x 5 cm = volume: 146 mL . Echogenicity within normal limits. No mass or hydronephrosis visualized. Left Kidney: Renal measurements: 11.3 x 6 x 6 cm = volume: 211 mL. Echogenicity within normal limits. No mass or hydronephrosis visualized. Bladder: Appears normal for degree of bladder distention. Slight impression upon the bladder base by prostate gland which measures 3.8 x 4.3 x 4.4 cm. IMPRESSION: 1. Negative renal sonogram. 2. Slight impression upon the bladder base by slightly enlarged prostate gland. Clinical and laboratory correlation recommended. Electronically Signed   By: Genia Del M.D.   On: 07/19/2018 16:03    Assessment & Plan:    Daniel Howe was seen today for annual exam, hyperlipidemia and hypertension.  Diagnoses and all orders for this visit:  Benign prostatic hyperplasia with weak urinary stream-  He has no symptoms that need to be treated. -     PSA, total and free; Future -     PSA, total and free  PSA elevation- His PSA is up to 8.2 but he has a relatively high free percentage PSA which only gives him about an 8% chance of prostate cancer.  I recommended continued surveillance. -     PSA, total and free; Future -     PSA, total and free  Essential hypertension- His blood pressure is adequately well controlled. -  Basic metabolic panel; Future -     Urinalysis, Routine w reflex microscopic; Future -     EKG 12-Lead -     TSH; Future -     TSH -     Urinalysis, Routine w reflex microscopic -     Basic metabolic panel  Pure hypercholesterolemia- He has achieved his LDL goal and is doing well on the statin. -     Lipid panel; Future -     TSH; Future -     TSH -     Lipid panel  Pure hyperglyceridemia- Improvement noted. -     Lipid panel; Future -     Lipid panel  Routine general medical examination at a health care facility- Exam completed, labs reviewed, vaccines reviewed and updated, colon cancer screening is up-to-date, patient education was given.  Stage 3a chronic kidney disease- He will continue to avoid nephrotoxic agents.  His blood pressure is adequately well controlled.  Heart block AV second degree- He is tolerating this well and has a good escape rhythm.  No intervention is needed at this time.  He will continue to follow-up with cardiology.  Hearing loss of both ears due to cerumen impaction- His hearing has returned to normal with the removal of the cerumen.   I have discontinued Felizardo Hoffmann. Sloop's metoprolol tartrate. I am also having him maintain his citalopram, atorvastatin, and telmisartan.  No orders of the defined types were placed in this encounter.  I spent 50  minutes in preparing to see the patient by review of recent labs, imaging and procedures, obtaining and reviewing separately obtained history, communicating with the patient and family or caregiver, ordering medications, tests or procedures, and documenting clinical information in the EHR including the differential Dx, treatment, and any further evaluation and other management of 1. Benign prostatic hyperplasia with weak urinary stream 2. PSA elevation 3. Essential hypertension 4. Pure hypercholesterolemia 5. Pure hyperglyceridemia 6. Stage 3a chronic kidney disease 7. Heart block AV second degree 8. Hearing loss of both ears due to cerumen impaction    Follow-up: Return in about 6 months (around 02/06/2020).  Scarlette Calico, MD

## 2019-08-07 NOTE — Patient Instructions (Signed)

## 2019-08-08 ENCOUNTER — Encounter: Payer: Self-pay | Admitting: Internal Medicine

## 2019-08-08 LAB — PSA, TOTAL AND FREE
PSA, % Free: 34 % (calc) (ref >25)
PSA, Free: 2.8 ng/mL
PSA, Total: 8.2 ng/mL — ABNORMAL HIGH (ref <=4.0)

## 2019-08-12 MED ORDER — DUTASTERIDE 0.5 MG PO CAPS: 0.5000 mg | ORAL_CAPSULE | Freq: Every day | ORAL | 1 refills | Status: DC

## 2019-08-12 MED ORDER — TADALAFIL 5 MG PO TABS: 5.0000 mg | ORAL_TABLET | Freq: Every day | ORAL | 1 refills | Status: DC

## 2019-08-12 NOTE — Addendum Note (Signed)
Addended by: Janith Lima on: 08/12/2019 10:37 AM   Modules accepted: Orders

## 2019-10-06 ENCOUNTER — Other Ambulatory Visit: Payer: Self-pay | Admitting: Internal Medicine

## 2019-10-06 DIAGNOSIS — I10 Essential (primary) hypertension: Secondary | ICD-10-CM

## 2019-10-06 DIAGNOSIS — E7800 Pure hypercholesterolemia, unspecified: Secondary | ICD-10-CM

## 2019-10-20 DIAGNOSIS — G4733 Obstructive sleep apnea (adult) (pediatric): Secondary | ICD-10-CM | POA: Diagnosis not present

## 2019-11-07 ENCOUNTER — Other Ambulatory Visit: Payer: Self-pay | Admitting: Internal Medicine

## 2019-11-07 DIAGNOSIS — E7800 Pure hypercholesterolemia, unspecified: Secondary | ICD-10-CM

## 2019-11-07 DIAGNOSIS — I10 Essential (primary) hypertension: Secondary | ICD-10-CM

## 2019-11-07 DIAGNOSIS — F32A Depression, unspecified: Secondary | ICD-10-CM

## 2019-12-28 DIAGNOSIS — L821 Other seborrheic keratosis: Secondary | ICD-10-CM | POA: Diagnosis not present

## 2019-12-28 DIAGNOSIS — D225 Melanocytic nevi of trunk: Secondary | ICD-10-CM | POA: Diagnosis not present

## 2019-12-28 DIAGNOSIS — Z85828 Personal history of other malignant neoplasm of skin: Secondary | ICD-10-CM | POA: Diagnosis not present

## 2019-12-28 DIAGNOSIS — L570 Actinic keratosis: Secondary | ICD-10-CM | POA: Diagnosis not present

## 2019-12-28 DIAGNOSIS — D2362 Other benign neoplasm of skin of left upper limb, including shoulder: Secondary | ICD-10-CM | POA: Diagnosis not present

## 2019-12-28 DIAGNOSIS — D1801 Hemangioma of skin and subcutaneous tissue: Secondary | ICD-10-CM | POA: Diagnosis not present

## 2020-01-05 ENCOUNTER — Ambulatory Visit: Payer: Medicare Other | Admitting: Internal Medicine

## 2020-01-05 ENCOUNTER — Other Ambulatory Visit: Payer: Self-pay

## 2020-01-05 ENCOUNTER — Encounter: Payer: Self-pay | Admitting: Internal Medicine

## 2020-01-05 VITALS — BP 144/78 | HR 83 | Ht 74.5 in | Wt 241.4 lb

## 2020-01-05 DIAGNOSIS — Z79899 Other long term (current) drug therapy: Secondary | ICD-10-CM

## 2020-01-05 DIAGNOSIS — I442 Atrioventricular block, complete: Secondary | ICD-10-CM

## 2020-01-05 MED ORDER — TELMISARTAN-HCTZ 40-12.5 MG PO TABS
1.0000 | ORAL_TABLET | Freq: Every day | ORAL | 3 refills | Status: DC
Start: 2020-01-05 — End: 2020-04-17

## 2020-01-05 NOTE — Patient Instructions (Signed)
Medication Instructions:  Your physician has recommended you make the following change in your medication:   Start Micardis/ HCTZ 40/ 12.5 mg once a day  Stop plain Micardis/ Telmisartan  *If you need a refill on your cardiac medications before your next appointment, please call your pharmacy*   Lab Work: Bmet in 2 weeks   If you have labs (blood work) drawn today and your tests are completely normal, you will receive your results only by: Marland Kitchen MyChart Message (if you have MyChart) OR . A paper copy in the mail If you have any lab test that is abnormal or we need to change your treatment, we will call you to review the results.   Testing/Procedures: none   Follow-Up: At [facility], you and your health needs are our priority.  As part of our continuing mission to provide you with exceptional heart care, we have created designated Provider Care Teams.  These Care Teams include your primary Cardiologist (physician) and Advanced Practice Providers (APPs -  Physician Assistants and Nurse Practitioners) who all work together to provide you with the care you need, when you need it.  We recommend signing up for the patient portal called "MyChart".  Sign up information is provided on this After Visit Summary.  MyChart is used to connect with patients for Virtual Visits (Telemedicine).  Patients are able to view lab/test results, encounter notes, upcoming appointments, etc.  Non-urgent messages can be sent to your provider as well.   To learn more about what you can do with MyChart, go to [email or web address].    Your next appointment:   12 month(s)  The format for your next appointment:   In Person  Provider:   Virl Axe, MD   Other Instructions

## 2020-01-05 NOTE — Progress Notes (Addendum)
      Patient Care Team: Janith Lima, MD as PCP - General (Internal Medicine)   HPI  Daniel Howe is a 79 y.o. male Seen  in follow-up of an abnormal Holter monitor undertaken by his PCP for bradycardia.  It was personally reviewed.  He had 3+ second pauses during the day with multiple nonconducted P waves.  There was concomitant PP prolongation suggestive of hyper vagotonia.     Walking without difficulty.  No lightheadedness.  No peripheral edema or chest pain.  No palpitations  Date PR Interval  6/13 242  5/18 500(about)  3/20 540  12/20 >600 ? junctional   HolterLongAVblock >> 300>>511msec   Date Cr K TSH Hgb  3/20 1.74 4.7 4.03 14.3   4/21 1.62 4.2 1.32       Records and Results Reviewed *  Past Medical History:  Diagnosis Date  . Anxiety   . Depression   . Hypertension     Past Surgical History:  Procedure Laterality Date  . CATARACT EXTRACTION    . CORNEAL TRANSPLANT    . HERNIA REPAIR  03/09/12   lih repair  . INGUINAL HERNIA REPAIR  03/09/2012   Procedure: HERNIA REPAIR INGUINAL ADULT;  Surgeon: Pedro Earls, MD;  Location: [facility];  Service: General;  Laterality: Left;    Current Meds  Medication Sig  . atorvastatin (LIPITOR) 40 MG tablet TAKE 1 TABLET(40 MG) BY MOUTH DAILY  . citalopram (CELEXA) 20 MG tablet TAKE 1 TABLET(20 MG) BY MOUTH DAILY  . dutasteride (AVODART) 0.5 MG capsule Take 1 capsule (0.5 mg total) by mouth daily.  . tadalafil (CIALIS) 5 MG tablet Take 1 tablet (5 mg total) by mouth daily.  Marland Kitchen telmisartan (MICARDIS) 40 MG tablet TAKE 1 TABLET(40 MG) BY MOUTH DAILY    No Known Allergies    Review of Systems negative except from HPI and PMH  Physical Exam BP (!) 144/78   Pulse 83   Ht 6' 2.5" (1.892 m)   Wt 241 lb 6.4 oz (109.5 kg)   SpO2 98%   BMI 30.58 kg/m  Well developed and nourished in no acute distress HENT normal Neck supple with JVP-  flat   Clear Irregular rate and rhythm, no murmurs or  gallops Abd-soft with active BS No Clubbing cyanosis edema Skin-warm and dry A & Oriented  Grossly normal sensory and motor function  ECG sinus rhythm at about 100 with variable Wenckebach antegrade conduction    Assessment and plan  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  Hence, we will follow it.  No intervention is indicated at this point but suspect he will come to pacing  Reviewed symptoms that might suggest worsening conduction issues   Blood pressure is elevated. Change from telmisartan to telmisartan HCT.  His renal function has improved somewhat.   check a metabolic profile about 2 weeks later.  Euvolemic continue current meds

## 2020-01-19 ENCOUNTER — Other Ambulatory Visit: Payer: Medicare Other | Admitting: *Deleted

## 2020-01-19 ENCOUNTER — Other Ambulatory Visit: Payer: Self-pay

## 2020-01-19 DIAGNOSIS — I442 Atrioventricular block, complete: Secondary | ICD-10-CM | POA: Diagnosis not present

## 2020-01-19 DIAGNOSIS — Z79899 Other long term (current) drug therapy: Secondary | ICD-10-CM

## 2020-01-19 LAB — BASIC METABOLIC PANEL
BUN/Creatinine Ratio: 20 (ref 10–24)
BUN: 33 mg/dL — ABNORMAL HIGH (ref 8–27)
CO2: 20 mmol/L (ref 20–29)
Calcium: 8.7 mg/dL (ref 8.6–10.2)
Chloride: 102 mmol/L (ref 96–106)
Creatinine, Ser: 1.69 mg/dL — ABNORMAL HIGH (ref 0.76–1.27)
GFR calc Af Amer: 45 mL/min/{1.73_m2} — ABNORMAL LOW (ref >59)
GFR calc non Af Amer: 39 mL/min/{1.73_m2} — ABNORMAL LOW (ref >59)
Glucose: 213 mg/dL — ABNORMAL HIGH (ref 65–99)
Potassium: 4.1 mmol/L (ref 3.5–5.2)
Sodium: 137 mmol/L (ref 134–144)

## 2020-01-22 DIAGNOSIS — G4733 Obstructive sleep apnea (adult) (pediatric): Secondary | ICD-10-CM | POA: Diagnosis not present

## 2020-02-05 ENCOUNTER — Other Ambulatory Visit: Payer: Self-pay | Admitting: Internal Medicine

## 2020-02-05 DIAGNOSIS — R3912 Poor urinary stream: Secondary | ICD-10-CM

## 2020-02-08 ENCOUNTER — Telehealth: Payer: Self-pay

## 2020-02-08 NOTE — Telephone Encounter (Signed)
Spoke with pt and advised per Dr Caryl Comes labs are stable.  Pt states he has not been checking his B/P but will and report any abnormal readings. Pt verbalized understanding and thanked Therapist, sports for call.

## 2020-02-08 NOTE — Telephone Encounter (Signed)
-----   Message from Deboraha Sprang, MD sent at 01/24/2020  4:35 PM EDT ----- Please Inform Patient that labs are stable How is his BP ? Thanks

## 2020-04-15 ENCOUNTER — Telehealth: Payer: Self-pay | Admitting: Internal Medicine

## 2020-04-15 NOTE — Telephone Encounter (Signed)
1.Medication Requested:atorvastatin (LIPITOR) 40 MG tablet    2. Pharmacy (Name, [place], [facility]): [facility]  [address] [place], [place], [place] [address]  3. On Med List: yes   4. Last Visit with PCP: 4.5.2021  5. Next visit date with PCP: n/a   Agent: Please be advised that RX refills may take up to 3 business days. We ask that you follow-up with your pharmacy.

## 2020-04-15 NOTE — Telephone Encounter (Signed)
Declined.   Per 08/07/19 OV note pt was to return for follow up in October. Please schedule.

## 2020-04-17 ENCOUNTER — Other Ambulatory Visit: Payer: Self-pay

## 2020-04-17 ENCOUNTER — Encounter: Payer: Self-pay | Admitting: Internal Medicine

## 2020-04-17 ENCOUNTER — Ambulatory Visit (INDEPENDENT_AMBULATORY_CARE_PROVIDER_SITE_OTHER): Payer: Medicare Other | Admitting: Internal Medicine

## 2020-04-17 VITALS — BP 144/86 | HR 98 | Temp 98.1°F | Ht 74.5 in | Wt 239.0 lb

## 2020-04-17 DIAGNOSIS — R739 Hyperglycemia, unspecified: Secondary | ICD-10-CM

## 2020-04-17 DIAGNOSIS — R972 Elevated prostate specific antigen [PSA]: Secondary | ICD-10-CM

## 2020-04-17 DIAGNOSIS — E7800 Pure hypercholesterolemia, unspecified: Secondary | ICD-10-CM

## 2020-04-17 DIAGNOSIS — N1831 Chronic kidney disease, stage 3a: Secondary | ICD-10-CM

## 2020-04-17 DIAGNOSIS — I10 Essential (primary) hypertension: Secondary | ICD-10-CM | POA: Diagnosis not present

## 2020-04-17 LAB — CBC WITH DIFFERENTIAL/PLATELET
Basophils Absolute: 0.1 10*3/uL (ref 0.0–0.1)
Basophils Relative: 0.7 % (ref 0.0–3.0)
Eosinophils Absolute: 0.1 10*3/uL (ref 0.0–0.7)
Eosinophils Relative: 1.2 % (ref 0.0–5.0)
HCT: 40.5 % (ref 39.0–52.0)
Hemoglobin: 14.5 g/dL (ref 13.0–17.0)
Lymphocytes Relative: 19.6 % (ref 12.0–46.0)
Lymphs Abs: 1.7 10*3/uL (ref 0.7–4.0)
MCHC: 35.8 g/dL (ref 30.0–36.0)
MCV: 95.3 fl (ref 78.0–100.0)
Monocytes Absolute: 1.3 10*3/uL — ABNORMAL HIGH (ref 0.1–1.0)
Monocytes Relative: 14.3 % — ABNORMAL HIGH (ref 3.0–12.0)
Neutro Abs: 5.6 10*3/uL (ref 1.4–7.7)
Neutrophils Relative %: 64.2 % (ref 43.0–77.0)
Platelets: 234 10*3/uL (ref 150.0–400.0)
RBC: 4.25 Mil/uL (ref 4.22–5.81)
RDW: 12.6 % (ref 11.5–15.5)
WBC: 8.7 10*3/uL (ref 4.0–10.5)

## 2020-04-17 LAB — BASIC METABOLIC PANEL
BUN: 39 mg/dL — ABNORMAL HIGH (ref 6–23)
CO2: 25 mEq/L (ref 19–32)
Calcium: 9 mg/dL (ref 8.4–10.5)
Chloride: 104 mEq/L (ref 96–112)
Creatinine, Ser: 1.88 mg/dL — ABNORMAL HIGH (ref 0.40–1.50)
GFR: 34.12 mL/min — ABNORMAL LOW (ref >60.00)
Glucose, Bld: 110 mg/dL — ABNORMAL HIGH (ref 70–99)
Potassium: 4.6 mEq/L (ref 3.5–5.1)
Sodium: 138 mEq/L (ref 135–145)

## 2020-04-17 LAB — HEPATIC FUNCTION PANEL
ALT: 41 U/L (ref 0–53)
AST: 27 U/L (ref 0–37)
Albumin: 4.2 g/dL (ref 3.5–5.2)
Alkaline Phosphatase: 55 U/L (ref 39–117)
Bilirubin, Direct: 0.1 mg/dL (ref 0.0–0.3)
Total Bilirubin: 0.9 mg/dL (ref 0.2–1.2)
Total Protein: 7 g/dL (ref 6.0–8.3)

## 2020-04-17 LAB — HEMOGLOBIN A1C: Hgb A1c MFr Bld: 5.4 % (ref 4.6–6.5)

## 2020-04-17 LAB — PSA: PSA: 5.7

## 2020-04-17 MED ORDER — ATORVASTATIN CALCIUM 40 MG PO TABS: ORAL_TABLET | ORAL | 1 refills | Status: DC

## 2020-04-17 MED ORDER — TELMISARTAN 40 MG PO TABS: 40.0000 mg | ORAL_TABLET | Freq: Every day | ORAL | 1 refills | Status: DC

## 2020-04-17 NOTE — Patient Instructions (Signed)

## 2020-04-17 NOTE — Progress Notes (Signed)
Subjective:  Patient ID: Daniel Howe, male    DOB: [birth year]  Age: 79 y.o. MRN: [id number]  CC: Hypertension  This visit occurred during the SARS-CoV-2 public health emergency.  Safety protocols were in place, including screening questions prior to the visit, additional usage of staff PPE, and extensive cleaning of exam room while observing appropriate contact time as indicated for disinfecting solutions.    HPI Daniel Howe presents for f/up - He tells me his blood pressure has been well controlled.  He denies any recent episodes of headache, blurred vision, dizziness, lightheadedness, palpitations, near syncope, DOE, or edema.  He is taking the combination of an ARB and thiazide diuretic.  Outpatient Medications Prior to Visit  Medication Sig Dispense Refill  . citalopram (CELEXA) 20 MG tablet TAKE 1 TABLET(20 MG) BY MOUTH DAILY 90 tablet 1  . dutasteride (AVODART) 0.5 MG capsule TAKE 1 CAPSULE(0.5 MG) BY MOUTH DAILY 90 capsule 1  . tadalafil (CIALIS) 5 MG tablet Take 1 tablet (5 mg total) by mouth daily. 90 tablet 1  . atorvastatin (LIPITOR) 40 MG tablet TAKE 1 TABLET(40 MG) BY MOUTH DAILY 90 tablet 0  . telmisartan-hydrochlorothiazide (MICARDIS HCT) 40-12.5 MG tablet Take 1 tablet by mouth daily. 90 tablet 3   No facility-administered medications prior to visit.    ROS Review of Systems  Constitutional: Negative.  Negative for appetite change, diaphoresis, fatigue and unexpected weight change.  HENT: Negative.   Eyes: Negative.   Respiratory: Negative for cough, chest tightness, shortness of breath and wheezing.   Cardiovascular: Negative for chest pain, palpitations and leg swelling.  Gastrointestinal: Negative for abdominal pain, constipation, diarrhea, nausea and vomiting.  Endocrine: Negative.   Genitourinary: Negative.  Negative for difficulty urinating, dysuria and hematuria.  Musculoskeletal: Negative.  Negative for arthralgias and myalgias.  Skin: Negative.    Neurological: Negative.  Negative for dizziness, weakness and light-headedness.  Hematological: Negative for adenopathy. Does not bruise/bleed easily.  Psychiatric/Behavioral: Negative.     Objective:  BP (!) 144/86   Pulse 98   Temp 98.1 F (36.7 C) (Oral)   Ht 6' 2.5" (1.892 m)   Wt 239 lb (108.4 kg)   SpO2 98%   BMI 30.28 kg/m   BP Readings from Last 3 Encounters:  04/17/20 (!) 144/86  01/05/20 (!) 144/78  08/07/19 136/74    Wt Readings from Last 3 Encounters:  04/17/20 239 lb (108.4 kg)  01/05/20 241 lb 6.4 oz (109.5 kg)  08/07/19 246 lb (111.6 kg)    Physical Exam Vitals reviewed.  HENT:     Nose: Nose normal.     Mouth/Throat:     Mouth: Mucous membranes are moist.  Eyes:     General: No scleral icterus.    Conjunctiva/sclera: Conjunctivae normal.  Cardiovascular:     Rate and Rhythm: Normal rate and regular rhythm. Occasional extrasystoles are present.    Heart sounds: No murmur heard. No gallop.   Pulmonary:     Effort: Pulmonary effort is normal.     Breath sounds: No stridor. No wheezing, rhonchi or rales.  Abdominal:     General: Abdomen is flat. Bowel sounds are normal. There is no distension.     Palpations: Abdomen is soft. There is no hepatomegaly, splenomegaly or mass.     Tenderness: There is no abdominal tenderness.  Musculoskeletal:     Cervical back: Neck supple.     Right lower leg: No edema.     Left lower leg: No edema.  Neurological:     General: No focal deficit present.     Mental Status: He is alert.  Psychiatric:        Mood and Affect: Mood normal.        Behavior: Behavior normal.     Lab Results  Component Value Date   WBC 8.7 04/17/2020   HGB 14.5 04/17/2020   HCT 40.5 04/17/2020   PLT 234.0 04/17/2020   GLUCOSE 110 (H) 04/17/2020   CHOL 100 08/07/2019   TRIG 112.0 08/07/2019   HDL 37.00 (L) 08/07/2019   LDLDIRECT 60.0 06/13/2015   LDLCALC 40 08/07/2019   ALT 41 04/17/2020   AST 27 04/17/2020   NA 138  04/17/2020   K 4.6 04/17/2020   CL 104 04/17/2020   CREATININE 1.88 (H) 04/17/2020   BUN 39 (H) 04/17/2020   CO2 25 04/17/2020   TSH 4.32 08/07/2019   PSA 5.7 04/17/2020   HGBA1C 5.4 04/17/2020    US Renal  Result Date: 07/19/2018 CLINICAL DATA:  79 year old male with renal insufficiency. Initial encounter. EXAM: RENAL / URINARY TRACT ULTRASOUND COMPLETE COMPARISON:  None. FINDINGS: Right Kidney: Renal measurements: 11.1 x 5 x 5 cm = volume: 146 mL . Echogenicity within normal limits. No mass or hydronephrosis visualized. Left Kidney: Renal measurements: 11.3 x 6 x 6 cm = volume: 211 mL. Echogenicity within normal limits. No mass or hydronephrosis visualized. Bladder: Appears normal for degree of bladder distention. Slight impression upon the bladder base by prostate gland which measures 3.8 x 4.3 x 4.4 cm. IMPRESSION: 1. Negative renal sonogram. 2. Slight impression upon the bladder base by slightly enlarged prostate gland. Clinical and laboratory correlation recommended. Electronically Signed   By: Genia Del M.D.   On: 07/19/2018 16:03    Assessment & Plan:   Daniel Howe was seen today for hypertension.  Diagnoses and all orders for this visit:  Essential hypertension- His blood pressure is well controlled but he has developed a prerenal azotemia and declining renal function.  I recommended that he stop taking the thiazide diuretic.  Will try to control his blood pressure with the ARB. -     Basic metabolic panel; Future -     Basic metabolic panel -     telmisartan (MICARDIS) 40 MG tablet; Take 1 tablet (40 mg total) by mouth daily.  Pure hypercholesterolemia- He has achieved his LDL goal and is doing well on the statin. -     atorvastatin (LIPITOR) 40 MG tablet; TAKE 1 TABLET(40 MG) BY MOUTH DAILY -     Hepatic function panel; Future -     Hepatic function panel  Stage 3a chronic kidney disease ([place])- See above. -     CBC with Differential/Platelet; Future -     Basic metabolic  panel; Future -     Basic metabolic panel -     CBC with Differential/Platelet -     US Renal; Future  Hyperglycemia- His A1c is down to 5.4%. -     Hemoglobin A1c; Future -     Hemoglobin A1c  PSA elevation- His PSA is lower than it was previously.  This is a reassuring sign that he does not have prostate cancer. -     PSA, total and free; Future -     PSA, total and free   I have discontinued Daniel Howe's telmisartan-hydrochlorothiazide. I am also having him start on telmisartan. Additionally, I am having him maintain his tadalafil, citalopram, dutasteride, and atorvastatin.  Meds ordered  this encounter  Medications  . atorvastatin (LIPITOR) 40 MG tablet    Sig: TAKE 1 TABLET(40 MG) BY MOUTH DAILY    Dispense:  90 tablet    Refill:  1  . telmisartan (MICARDIS) 40 MG tablet    Sig: Take 1 tablet (40 mg total) by mouth daily.    Dispense:  90 tablet    Refill:  1     Follow-up: Return in about 6 months (around 10/16/2020).  Scarlette Calico, MD

## 2020-04-18 ENCOUNTER — Telehealth: Payer: Self-pay | Admitting: Internal Medicine

## 2020-04-18 LAB — PSA, TOTAL AND FREE
PSA, % Free: 39 % (calc) (ref >25)
PSA, Free: 2.2 ng/mL
PSA, Total: 5.7 ng/mL — ABNORMAL HIGH (ref <=4.0)

## 2020-04-18 NOTE — Telephone Encounter (Signed)
   Please call patient to discuss lab results. He would also like a copy of lab results  left at the front desk for pick up. Printed and placed at front desk

## 2020-05-05 ENCOUNTER — Other Ambulatory Visit: Payer: Self-pay | Admitting: Internal Medicine

## 2020-05-05 DIAGNOSIS — E7800 Pure hypercholesterolemia, unspecified: Secondary | ICD-10-CM

## 2020-05-05 DIAGNOSIS — F32A Depression, unspecified: Secondary | ICD-10-CM

## 2020-07-03 DIAGNOSIS — L821 Other seborrheic keratosis: Secondary | ICD-10-CM | POA: Diagnosis not present

## 2020-07-03 DIAGNOSIS — D1801 Hemangioma of skin and subcutaneous tissue: Secondary | ICD-10-CM | POA: Diagnosis not present

## 2020-07-03 DIAGNOSIS — L570 Actinic keratosis: Secondary | ICD-10-CM | POA: Diagnosis not present

## 2020-07-03 DIAGNOSIS — Z85828 Personal history of other malignant neoplasm of skin: Secondary | ICD-10-CM | POA: Diagnosis not present

## 2020-07-09 ENCOUNTER — Ambulatory Visit
Admission: RE | Admit: 2020-07-09 | Discharge: 2020-07-09 | Disposition: A | Discharge status: Home / Self Care | Payer: Medicare Other | Source: Ambulatory Visit | Attending: Internal Medicine | Admitting: Internal Medicine

## 2020-07-09 DIAGNOSIS — N183 Chronic kidney disease, stage 3 unspecified: Secondary | ICD-10-CM | POA: Diagnosis not present

## 2020-07-09 DIAGNOSIS — N1831 Chronic kidney disease, stage 3a: Secondary | ICD-10-CM

## 2020-08-05 ENCOUNTER — Other Ambulatory Visit: Payer: Self-pay | Admitting: Internal Medicine

## 2020-08-05 DIAGNOSIS — N401 Enlarged prostate with lower urinary tract symptoms: Secondary | ICD-10-CM

## 2020-09-04 ENCOUNTER — Telehealth: Payer: Self-pay | Admitting: Internal Medicine

## 2020-09-04 DIAGNOSIS — Z961 Presence of intraocular lens: Secondary | ICD-10-CM | POA: Diagnosis not present

## 2020-09-04 DIAGNOSIS — Z947 Corneal transplant status: Secondary | ICD-10-CM | POA: Diagnosis not present

## 2020-09-04 NOTE — Progress Notes (Signed)
  Chronic Care Management   Outreach Note  09/04/2020 Name: HAI GRABE MRN: [id number] DOB: 1940/06/15  Referred by: Janith Lima, MD Reason for referral : No chief complaint on file.   An unsuccessful telephone outreach was attempted today. The patient was referred to the pharmacist for assistance with care management and care coordination.   Follow Up Plan:   Carley Perdue [employer] Scheduler

## 2020-09-05 ENCOUNTER — Telehealth: Payer: Self-pay | Admitting: Internal Medicine

## 2020-09-05 NOTE — Progress Notes (Signed)
  Chronic Care Management   Note  09/05/2020 Name: [name] MRN: [id number] DOB: Jun 11, 1940  Daniel Howe is a 80 y.o. year old male who is a primary care patient of Janith Lima, MD. I reached out to Daniel Howe by phone today in response to a referral sent by Daniel Howe's PCP, Janith Lima, MD.   Daniel Howe was given information about Chronic Care Management services today including:  1. CCM service includes personalized support from designated clinical staff supervised by his physician, including individualized plan of care and coordination with other care providers 2. 24/7 contact phone numbers for assistance for urgent and routine care needs. 3. Service will only be billed when office clinical staff spend 20 minutes or more in a month to coordinate care. 4. Only one practitioner may furnish and bill the service in a calendar month. 5. The patient may stop CCM services at any time (effective at the end of the month) by phone call to the office staff.   Patient agreed to services and verbal consent obtained.   Follow up plan:   Daniel Howe [employer] Scheduler

## 2020-10-01 ENCOUNTER — Other Ambulatory Visit: Payer: Self-pay | Admitting: Internal Medicine

## 2020-10-01 DIAGNOSIS — R972 Elevated prostate specific antigen [PSA]: Secondary | ICD-10-CM

## 2020-10-01 DIAGNOSIS — I10 Essential (primary) hypertension: Secondary | ICD-10-CM

## 2020-10-01 DIAGNOSIS — E7800 Pure hypercholesterolemia, unspecified: Secondary | ICD-10-CM

## 2020-10-01 DIAGNOSIS — N1831 Chronic kidney disease, stage 3a: Secondary | ICD-10-CM

## 2020-10-02 ENCOUNTER — Other Ambulatory Visit: Payer: Self-pay

## 2020-10-02 ENCOUNTER — Encounter: Payer: Self-pay | Admitting: Internal Medicine

## 2020-10-02 ENCOUNTER — Ambulatory Visit (INDEPENDENT_AMBULATORY_CARE_PROVIDER_SITE_OTHER): Payer: Medicare Other | Admitting: Internal Medicine

## 2020-10-02 VITALS — BP 142/86 | HR 96 | Temp 98.5°F | Resp 16 | Ht 74.5 in | Wt 223.0 lb

## 2020-10-02 DIAGNOSIS — N1831 Chronic kidney disease, stage 3a: Secondary | ICD-10-CM | POA: Diagnosis not present

## 2020-10-02 DIAGNOSIS — R972 Elevated prostate specific antigen [PSA]: Secondary | ICD-10-CM

## 2020-10-02 DIAGNOSIS — I10 Essential (primary) hypertension: Secondary | ICD-10-CM

## 2020-10-02 DIAGNOSIS — F3341 Major depressive disorder, recurrent, in partial remission: Secondary | ICD-10-CM

## 2020-10-02 LAB — PSA: PSA: 9.37 ng/mL — ABNORMAL HIGH (ref 0.10–4.00)

## 2020-10-02 MED ORDER — TELMISARTAN 40 MG PO TABS: 40.0000 mg | ORAL_TABLET | Freq: Every day | ORAL | 1 refills | Status: DC

## 2020-10-02 MED ORDER — ESCITALOPRAM OXALATE 10 MG PO TABS: 10.0000 mg | ORAL_TABLET | Freq: Every day | ORAL | 1 refills | Status: DC

## 2020-10-02 NOTE — Progress Notes (Signed)
Subjective:  Patient ID: Daniel Howe, male    DOB: 10/06/1940  Age: 80 y.o. MRN: [id number]  CC: Hypertension  This visit occurred during the SARS-CoV-2 public health emergency.  Safety protocols were in place, including screening questions prior to the visit, additional usage of staff PPE, and extensive cleaning of exam room while observing appropriate contact time as indicated for disinfecting solutions.    HPI Daniel Howe presents for f/up -   He complains of early morning anxiety and early morning awakenings.  He would like to change Celexa to something else.  He previously took Lexapro and had a good response to it.  He walks about a mile or 2 a day and has good energy.  He denies CP, DOE, palpitations, edema, or fatigue.  Outpatient Medications Prior to Visit  Medication Sig Dispense Refill  . atorvastatin (LIPITOR) 40 MG tablet TAKE 1 TABLET(40 MG) BY MOUTH DAILY 90 tablet 1  . dutasteride (AVODART) 0.5 MG capsule TAKE (1) CAPSULE DAILY. 90 capsule 1  . tadalafil (CIALIS) 5 MG tablet Take 1 tablet (5 mg total) by mouth daily. 90 tablet 1  . citalopram (CELEXA) 20 MG tablet TAKE 1 TABLET(20 MG) BY MOUTH DAILY 90 tablet 1  . telmisartan (MICARDIS) 40 MG tablet Take 1 tablet (40 mg total) by mouth daily. 90 tablet 1   No facility-administered medications prior to visit.    ROS Review of Systems  Constitutional: Negative.  Negative for diaphoresis and fatigue.  HENT: Negative.   Eyes: Negative.   Respiratory: Negative.  Negative for cough, chest tightness, shortness of breath and wheezing.   Cardiovascular: Negative for chest pain, palpitations and leg swelling.  Gastrointestinal: Negative for abdominal pain, diarrhea and nausea.  Genitourinary: Negative.  Negative for difficulty urinating and dysuria.  Musculoskeletal: Negative.  Negative for myalgias.  Skin: Negative.  Negative for color change.  Neurological: Negative.  Negative for dizziness, weakness and  light-headedness.  Hematological: Negative for adenopathy. Does not bruise/bleed easily.  Psychiatric/Behavioral: Positive for dysphoric mood. Negative for behavioral problems, confusion, decreased concentration, self-injury, sleep disturbance and suicidal ideas. The patient is nervous/anxious. The patient is not hyperactive.     Objective:  BP (!) 142/86 (BP Location: Left Arm, Patient Position: Sitting, Cuff Size: Large)   Pulse 96   Temp 98.5 F (36.9 C) (Oral)   Resp 16   Ht 6' 2.5" (1.892 m)   Wt 223 lb (101.2 kg)   SpO2 97%   BMI 28.25 kg/m   BP Readings from Last 3 Encounters:  10/02/20 (!) 142/86  04/17/20 (!) 144/86  01/05/20 (!) 144/78    Wt Readings from Last 3 Encounters:  10/02/20 223 lb (101.2 kg)  04/17/20 239 lb (108.4 kg)  01/05/20 241 lb 6.4 oz (109.5 kg)    Physical Exam Vitals reviewed.  Constitutional:      Appearance: Normal appearance.  HENT:     Nose: Nose normal.     Mouth/Throat:     Mouth: Mucous membranes are moist.  Eyes:     General: No scleral icterus.    Conjunctiva/sclera: Conjunctivae normal.  Cardiovascular:     Rate and Rhythm: Normal rate and regular rhythm.     Heart sounds: No murmur heard.   Pulmonary:     Effort: Pulmonary effort is normal.     Breath sounds: No stridor. No wheezing, rhonchi or rales.  Abdominal:     General: Abdomen is protuberant. Bowel sounds are normal. There is no distension.  Palpations: Abdomen is soft. There is no hepatomegaly, splenomegaly or mass.  Musculoskeletal:        General: Normal range of motion.     Cervical back: Neck supple.     Right lower leg: No edema.     Left lower leg: No edema.  Lymphadenopathy:     Cervical: No cervical adenopathy.  Skin:    General: Skin is warm and dry.  Neurological:     General: No focal deficit present.     Mental Status: He is alert.  Psychiatric:        Mood and Affect: Mood normal.        Behavior: Behavior normal.        Thought Content:  Thought content normal. Thought content is not paranoid or delusional. Thought content does not include homicidal or suicidal ideation.        Judgment: Judgment normal.     Lab Results  Component Value Date   WBC 8.7 04/17/2020   HGB 14.5 04/17/2020   HCT 40.5 04/17/2020   PLT 234.0 04/17/2020   GLUCOSE 109 (H) 10/02/2020   CHOL 100 08/07/2019   TRIG 112.0 08/07/2019   HDL 37.00 (L) 08/07/2019   LDLDIRECT 60.0 06/13/2015   LDLCALC 40 08/07/2019   ALT 41 04/17/2020   AST 27 04/17/2020   NA 135 10/02/2020   K 4.7 10/02/2020   CL 105 10/02/2020   CREATININE 1.63 (H) 10/02/2020   BUN 37 (H) 10/02/2020   CO2 17 (L) 10/02/2020   TSH 4.32 08/07/2019   PSA 9.37 (H) 10/02/2020   HGBA1C 5.4 04/17/2020    US Renal  Result Date: 07/09/2020 CLINICAL DATA:  Stage 3 chronic kidney disease EXAM: RENAL / URINARY TRACT ULTRASOUND COMPLETE COMPARISON:  None. FINDINGS: Right Kidney: Renal measurements: 11.3 x 5.2 x 6.5 cm = volume: 202 mL. Echogenicity within normal limits. No mass or hydronephrosis visualized. Left Kidney: Renal measurements: 11.7 x 6.6 x 6.1 cm = volume: 244 mL. Echogenicity within normal limits. No mass or hydronephrosis visualized. Bladder: Appears normal for degree of bladder distention. Other: Enlarged prostate. IMPRESSION: No significant abnormality of kidneys or bladder. Electronically Signed   By: Miachel Roux M.D.   On: 07/09/2020 15:15    Assessment & Plan:   Daniel Howe was seen today for hypertension.  Diagnoses and all orders for this visit:  Recurrent major depressive disorder, in partial remission ([place])- Will change to Lexapro.  Will increase the dose over time. -     escitalopram (LEXAPRO) 10 MG tablet; Take 1 tablet (10 mg total) by mouth daily.  PSA elevation- His PSA is risen to 9.4.  I recommended that he see urology to be screened for prostate cancer. -     PSA -     Ambulatory referral to Urology  Essential hypertension- His blood pressure is adequately  well controlled. -     Basic metabolic panel -     telmisartan (MICARDIS) 40 MG tablet; Take 1 tablet (40 mg total) by mouth daily.  Stage 3a chronic kidney disease ([place])- His renal function is stable.  His blood pressure is adequately well controlled.  He will avoid nephrotoxic agents. -     Basic metabolic panel   I have discontinued Felizardo Hoffmann. Montejano's citalopram. I am also having him start on escitalopram. Additionally, I am having him maintain his tadalafil, atorvastatin, dutasteride, and telmisartan.  Meds ordered this encounter  Medications  . escitalopram (LEXAPRO) 10 MG tablet    Sig:  Take 1 tablet (10 mg total) by mouth daily.    Dispense:  90 tablet    Refill:  1  . telmisartan (MICARDIS) 40 MG tablet    Sig: Take 1 tablet (40 mg total) by mouth daily.    Dispense:  90 tablet    Refill:  1     Follow-up: No follow-ups on file.  Scarlette Calico, MD

## 2020-10-03 ENCOUNTER — Encounter: Payer: Self-pay | Admitting: Internal Medicine

## 2020-10-03 LAB — BASIC METABOLIC PANEL
BUN: 37 mg/dL — ABNORMAL HIGH (ref 6–23)
CO2: 17 mEq/L — ABNORMAL LOW (ref 19–32)
Calcium: 9.4 mg/dL (ref 8.4–10.5)
Chloride: 105 mEq/L (ref 96–112)
Creatinine, Ser: 1.63 mg/dL — ABNORMAL HIGH (ref 0.40–1.50)
GFR: 40.36 mL/min — ABNORMAL LOW (ref >60.00)
Glucose, Bld: 109 mg/dL — ABNORMAL HIGH (ref 70–99)
Potassium: 4.7 mEq/L (ref 3.5–5.1)
Sodium: 135 mEq/L (ref 135–145)

## 2020-10-08 ENCOUNTER — Telehealth: Payer: Self-pay | Admitting: Pharmacist

## 2020-10-08 ENCOUNTER — Other Ambulatory Visit: Payer: Self-pay | Admitting: Internal Medicine

## 2020-10-08 DIAGNOSIS — E7800 Pure hypercholesterolemia, unspecified: Secondary | ICD-10-CM

## 2020-10-08 NOTE — Progress Notes (Signed)
    Chronic Care Management Pharmacy Assistant   Name: Daniel Howe  MRN: [id number] DOB: 08/22/1940  Reason for Encounter: Initial Patients Appointment:  OV 10/10/20 @ 2 pm   Recent office visits:  10/02/20 Ronnald Ramp (PCP) - Depression. Start Lexapro 10 mg. D/c Celexa. Referral to Urology.   04/17/21 Jones Hypertension. Start Telmisartan 40mg . D/c Telmisartan-hctz. F/u 6 mos.  Recent consult visits:  09/04/20 G[place]ack (Opthamology) - f/u exam Fuch's dystrophy OU and pseudophakia OS. F/u 1 yr.  Hospital visits:  None in previous 6 months  Medications: Outpatient Encounter Medications as of 10/08/2020  Medication Sig  . atorvastatin (LIPITOR) 40 MG tablet TAKE 1 TABLET ONCE DAILY.  Marland Kitchen dutasteride (AVODART) 0.5 MG capsule TAKE (1) CAPSULE DAILY.  Marland Kitchen escitalopram (LEXAPRO) 10 MG tablet Take 1 tablet (10 mg total) by mouth daily.  . tadalafil (CIALIS) 5 MG tablet Take 1 tablet (5 mg total) by mouth daily.  Marland Kitchen telmisartan (MICARDIS) 40 MG tablet Take 1 tablet (40 mg total) by mouth daily.   No facility-administered encounter medications on file as of 10/08/2020.   Have you seen any other providers since your last visit? Patient states he has not seen any other providers.  Any changes in your medications or health?  Patient states he was recently changed from [place] to [place].  Any side effects from any medications?  Patient states he stopped taking the Lexapro because he believes it was causing him to have diarrhea.   Do you have an symptoms or problems not managed by your medications?  Patient states no symptoms or problems at this time.  Any concerns about your health right now?  Patient states he was given a referral to a urologists and haven't heard anything from anyone as far as an appointment.  Has your provider asked that you check blood pressure, blood sugar, or follow special diet at home? Patient states he checks his BP from time to time but does not record his readings, he  states the readings vary when he takes it. Patient does not check glucose but does eat a healthy diet.  Do you get any type of exercise on a regular basis?  Patient states he walk a mile everyday in 20 mins if the weather permits.  Can you think of a goal you would like to reach for your health?  Patient states no goals at this time.  Do you have any problems getting your medications? Patient states no problems getting his medicines.  Is there anything that you would like to discuss during the appointment?  Patient states he wants to know if there is an alternative to Lexapro he can take for his depression.  Please bring medications and supplements to appointment  Star Rating Drugs: Atorvastatin - 07/13/20 90D Telmisartan - 10/02/20 [place], RMA Clinical Pharmacists Assistant [phone]  Time Spent: [phone]

## 2020-10-10 ENCOUNTER — Ambulatory Visit (INDEPENDENT_AMBULATORY_CARE_PROVIDER_SITE_OTHER): Payer: Medicare Other | Admitting: Pharmacist

## 2020-10-10 ENCOUNTER — Other Ambulatory Visit: Payer: Self-pay

## 2020-10-10 DIAGNOSIS — R3912 Poor urinary stream: Secondary | ICD-10-CM | POA: Diagnosis not present

## 2020-10-10 DIAGNOSIS — I10 Essential (primary) hypertension: Secondary | ICD-10-CM | POA: Diagnosis not present

## 2020-10-10 DIAGNOSIS — F3341 Major depressive disorder, recurrent, in partial remission: Secondary | ICD-10-CM

## 2020-10-10 DIAGNOSIS — N1832 Chronic kidney disease, stage 3b: Secondary | ICD-10-CM

## 2020-10-10 DIAGNOSIS — N401 Enlarged prostate with lower urinary tract symptoms: Secondary | ICD-10-CM

## 2020-10-10 DIAGNOSIS — F419 Anxiety disorder, unspecified: Secondary | ICD-10-CM

## 2020-10-10 DIAGNOSIS — E7800 Pure hypercholesterolemia, unspecified: Secondary | ICD-10-CM | POA: Diagnosis not present

## 2020-10-10 NOTE — Patient Instructions (Signed)
Visit Information  Phone number for Pharmacist: [phone]  Thank you for meeting with me to discuss your medications! I look forward to working with you to achieve your health care goals. Below is a summary of what we talked about during the visit:   Goals Addressed             This Visit's Progress    Manage My Medicine       Timeframe:  Long-Range Goal Priority:  Medium Start Date:     10/10/20                        Expected End Date: 10/10/21                  Follow Up Date Dec 2022   - call for medicine refill 2 or 3 days before it runs out - call if I am sick and can't take my medicine - keep a list of all the medicines I take; vitamins and herbals too  -Restart citalopram 20 mg daily. Dispose of escitalopram   Why is this important?   These steps will help you keep on track with your medicines.   Notes:          Mr. Chermak was given information about Chronic Care Management services today including:  CCM service includes personalized support from designated clinical staff supervised by his physician, including individualized plan of care and coordination with other care providers 24/7 contact phone numbers for assistance for urgent and routine care needs. Standard insurance, coinsurance, copays and deductibles apply for chronic care management only during months in which we provide at least 20 minutes of these services. Most insurances cover these services at 100%, however patients may be responsible for any copay, coinsurance and/or deductible if applicable. This service may help you avoid the need for more expensive face-to-face services. Only one practitioner may furnish and bill the service in a calendar month. The patient may stop CCM services at any time (effective at the end of the month) by phone call to the office staff.  Patient agreed to services and verbal consent obtained.   Patient verbalizes understanding of instructions provided today and agrees to  view in [place].  Telephone follow up appointment with pharmacy team member scheduled for: 6 months   Charlene Brooke, PharmD, [place], CPP Clinical Pharmacist [facility] at [facility] [phone]

## 2020-10-10 NOTE — Progress Notes (Signed)
 Chronic Care Management Pharmacy Note  10/10/2020 Name:  Daniel Howe MRN:  [id number] DOB:  07/19/1940  Summary: -Pt had to stop escitalopram due to diarrhea. He has been off SSRIs for a week  -Pt has urology appt this week for prostate cancer screening  Recommendations/Changes made from today's visit: -Discontinue escitalopram. Restart citalopram 20 mg daily. May consider changing SSRI once acute stress (cancer screening) is over    Subjective: Daniel Howe is an 80 y.o. year old male who is a primary patient of Jones, Thomas L, MD.  The CCM team was consulted for assistance with disease management and care coordination needs.    Engaged with patient face to face for initial visit in response to provider referral for pharmacy case management and/or care coordination services.   Consent to Services:  The patient was given the following information about Chronic Care Management services today, agreed to services, and gave verbal consent: 1. CCM service includes personalized support from designated clinical staff supervised by the primary care provider, including individualized plan of care and coordination with other care providers 2. 24/7 contact phone numbers for assistance for urgent and routine care needs. 3. Service will only be billed when office clinical staff spend 20 minutes or more in a month to coordinate care. 4. Only one practitioner may furnish and bill the service in a calendar month. 5.The patient may stop CCM services at any time (effective at the end of the month) by phone call to the office staff. 6. The patient will be responsible for cost sharing (co-pay) of up to 20% of the service fee (after annual deductible is met). Patient agreed to services and consent obtained.  Patient Care Team: Jones, Thomas L, MD as PCP - General (Internal Medicine) Foltanski, Lindsey N, RPH as Pharmacist (Pharmacist)   Patient lives at home with his wife. He retired from sales  (women's sportswear) and his wife is retired schoolteacher. They have a dog (lakeland terrier) and he walks a mile with her every day.  Today he is worried about seeing urologist tomorrow to screen for prostate cancer.  Recent office visits: 10/02/20 Jones (PCP) - Depression. Start Lexapro 10 mg. D/c Celexa. Referral to Urology (PSA 9.4)  04/17/21 Jones OV: Hypertension. Start Telmisartan 40mg. D/c Telmisartan-hctz. GFR 34. F/u 6 mos.  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 F/u 1 yr.  Hospital visits: None in previous 6 months   Objective:  Lab Results  Component Value Date   CREATININE 1.63 (H) 10/02/2020   BUN 37 (H) 10/02/2020   GFR 40.36 (L) 10/02/2020   GFRNONAA 39 (L) 01/19/2020   GFRAA 45 (L) 01/19/2020   NA 135 10/02/2020   K 4.7 10/02/2020   CALCIUM 9.4 10/02/2020   CO2 17 (L) 10/02/2020   GLUCOSE 109 (H) 10/02/2020    Lab Results  Component Value Date/Time   HGBA1C 5.4 04/17/2020 03:36 PM   HGBA1C 5.6 07/05/2018 10:10 AM   GFR 40.36 (L) 10/02/2020 03:55 PM   GFR 34.12 (L) 04/17/2020 03:36 PM    Last diabetic Eye exam: No results found for: HMDIABEYEEXA  Last diabetic Foot exam: No results found for: HMDIABFOOTEX   Lab Results  Component Value Date   CHOL 100 08/07/2019   HDL 37.00 (L) 08/07/2019   LDLCALC 40 08/07/2019   LDLDIRECT 60.0 06/13/2015   TRIG 112.0 08/07/2019   CHOLHDL 3 08/07/2019    Hepatic Function Latest Ref Rng &   Units 04/17/2020 07/05/2018 08/09/2017  Total Protein 6.0 - 8.3 g/dL 7.0 7.0 6.8  Albumin 3.5 - 5.2 g/dL 4.2 4.2 3.8  AST 0 - 37 U/L 27 22 18  ALT 0 - 53 U/L 41 45 36  Alk Phosphatase 39 - 117 U/L 55 67 48  Total Bilirubin 0.2 - 1.2 mg/dL 0.9 1.0 0.6  Bilirubin, Direct 0.0 - 0.3 mg/dL 0.1 - -    Lab Results  Component Value Date/Time   TSH 4.32 08/07/2019 10:01 AM   TSH 4.03 07/05/2018 10:10 AM    CBC Latest Ref Rng & Units 04/17/2020 07/05/2018 08/11/2016   WBC 4.0 - 10.5 K/uL 8.7 9.5 8.3  Hemoglobin 13.0 - 17.0 g/dL 14.5 14.3 14.4  Hematocrit 39.0 - 52.0 % 40.5 41.3 41.2  Platelets 150.0 - 400.0 K/uL 234.0 239.0 219.0    No results found for: VD25OH  Clinical ASCVD: No  The ASCVD Risk score (Goff DC Jr., et al., 2013) failed to calculate for the following reasons:   The valid total cholesterol range is 130 to 320 mg/dL    Depression screen PHQ 2/9 08/07/2019 07/05/2018 07/05/2018  Decreased Interest 0 0 0  Down, Depressed, Hopeless 0 0 0  PHQ - 2 Score 0 0 0  Altered sleeping 0 - 0  Tired, decreased energy 0 - 0  Change in appetite 0 - 0  Feeling bad or failure about yourself  0 - 0  Trouble concentrating 0 - 0  Moving slowly or fidgety/restless 0 - 0  Suicidal thoughts 0 - 0  PHQ-9 Score 0 - 0  Difficult doing work/chores Not difficult at all - Not difficult at all    GAD 7 : Generalized Anxiety Score 08/07/2019  Nervous, Anxious, on Edge 0  Control/stop worrying 0  Worry too much - different things 0  Trouble relaxing 0  Restless 0  Easily annoyed or irritable 0  Afraid - awful might happen 0  Total GAD 7 Score 0  Anxiety Difficulty Not difficult at all   Lab Results  Component Value Date/Time   PSA 9.37 (H) 10/02/2020 03:55 PM   PSA 5.7 04/17/2020 12:00 AM   PSA 5.7 12/28/2018 12:00 AM      Social History   Tobacco Use  Smoking Status Never  Smokeless Tobacco Never   BP Readings from Last 3 Encounters:  10/02/20 (!) 142/86  04/17/20 (!) 144/86  01/05/20 (!) 144/78   Pulse Readings from Last 3 Encounters:  10/02/20 96  04/17/20 98  01/05/20 83   Wt Readings from Last 3 Encounters:  10/02/20 223 lb (101.2 kg)  04/17/20 239 lb (108.4 kg)  01/05/20 241 lb 6.4 oz (109.5 kg)   BMI Readings from Last 3 Encounters:  10/02/20 28.25 kg/m  04/17/20 30.28 kg/m  01/05/20 30.58 kg/m    Assessment/Interventions: Review of patient past medical history, allergies, medications, health status, including review of  consultants reports, laboratory and other test data, was performed as part of comprehensive evaluation and provision of chronic care management services.   SDOH:  (Social Determinants of Health) assessments and interventions performed: Yes SDOH Interventions    Flowsheet Row Most Recent Value  SDOH Interventions   Financial Strain Interventions Intervention Not Indicated      SDOH Screenings   Alcohol Screen: Not on file  Depression (PHQ2-9): Not on file  Financial Resource Strain: Low Risk    Difficulty of Paying Living Expenses: Not hard at all  Food Insecurity: Not on file  Housing:   Not on file  Physical Activity: Not on file  Social Connections: Not on file  Stress: Not on file  Tobacco Use: Low Risk    Smoking Tobacco Use: Never   Smokeless Tobacco Use: Never  Transportation Needs: Not on file    CCM Care Plan  Allergies  Allergen Reactions   Escitalopram Diarrhea    Medications Reviewed Today     Reviewed by Foltanski, Lindsey N, RPH (Pharmacist) on 10/10/20 at 1518  Med List Status: <None>   Medication Order Taking? Sig Documenting Provider Last Dose Status Informant  atorvastatin (LIPITOR) 40 MG tablet [id number] Yes TAKE 1 TABLET ONCE DAILY. Jones, Thomas L, MD Taking Active   Cholecalciferol (VITAMIN D) 50 MCG (2000 UT) CAPS [id number] Yes Take 1 capsule by mouth daily. [provider] Taking Active   citalopram (CELEXA) 20 MG tablet [id number] Yes Take 20 mg by mouth daily. [provider]  Active   dutasteride (AVODART) 0.5 MG capsule [id number] Yes TAKE (1) CAPSULE DAILY. Jones, Thomas L, MD Taking Active   tadalafil (CIALIS) 5 MG tablet [id number] Yes Take 1 tablet (5 mg total) by mouth daily. Jones, Thomas L, MD Taking Active   telmisartan (MICARDIS) 40 MG tablet [id number] Yes Take 1 tablet (40 mg total) by mouth daily. 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   Chronic renal disease, stage 3, moderately decreased glomerular filtration rate (GFR) between 30-59 mL/min/1.73 square meter (HCC) 11/15/2013   Hyperglycemia 04/05/2013   Pure hyperglyceridemia 04/05/2013   Routine general medical examination at a health care facility 10/12/2011   Pure hypercholesterolemia 10/12/2011   BPH (benign prostatic hyperplasia) 10/12/2011   Depression, controlled 05/30/2010   Essential hypertension 05/30/2010    Immunization History  Administered Date(s) Administered   Influenza Whole 02/25/2011   Influenza, High Dose Seasonal PF 01/22/2015, 01/03/2017, 01/21/2018   Influenza,inj,Quad PF,6+ Mos 01/29/2014, 02/04/2016   Influenza-Unspecified 03/07/2013, 12/31/2018   PFIZER(Purple Top)SARS-COV-2 Vaccination 05/19/2019, 07/07/2019, 02/01/2020   Pneumococcal Conjugate-13 11/15/2013   Pneumococcal Polysaccharide-23 05/30/2010   Td 05/30/2010   Tdap 05/27/2018   Zoster, Live 04/05/2013    Conditions to be addressed/monitored:  Hypertension, Hyperlipidemia, Chronic Kidney Disease, Depression, Anxiety, and BPH  Care Plan : CCM Pharmacy Care Plan  Updates made by Foltanski, Lindsey N, RPH since 10/10/2020 12:00 AM     Problem: Hypertension, Hyperlipidemia, Chronic Kidney Disease, Anxiety, and BPH   Priority: High     Long-Range Goal: Disease management   Start Date: 10/10/2020  Expected End Date: 10/10/2021  This Visit's Progress: On track  Priority: High  Note:   Current Barriers:  Unable to independently monitor therapeutic efficacy Suboptimal therapeutic regimen for anxiety/depression  Pharmacist Clinical Goal(s):  Patient will achieve adherence to monitoring  guidelines and medication adherence to achieve therapeutic efficacy adhere to plan to optimize therapeutic regimen for anxiety/depression as evidenced by report of adherence to recommended medication management changes through collaboration with PharmD and provider.   Interventions: 1:1 collaboration with Jones, Thomas L, MD regarding development and update of comprehensive plan of care as evidenced by provider attestation and co-signature Inter-disciplinary care team   collaboration (see longitudinal plan of care) Comprehensive medication review performed; medication list updated in electronic medical record  Hypertension (BP goal <140/90) -Controlled - BP at home is generally at goal, with some spikes recently related to stress/anxiety -Current treatment: Telmisartan 40 mg daily -Medications previously tried: losartan/hctz, metoprolol, valsartan -Current home readings: 138/78 -Current exercise habits: walks 1 mile every day -Denies hypotensive/hypertensive symptoms -Educated on BP goals and benefits of medications for prevention of heart attack, stroke and kidney damage; Exercise goal of 150 minutes per week; Importance of home blood pressure monitoring; -Counseled to monitor BP at home daily, document, and provide log at future appointments -Recommended to continue current medication  Hyperlipidemia: (LDL goal < 100) -Controlled - LDL is at goal; pt endorses compliance and denies side effects -Current treatment: Atorvastatin 40 mg daily -Educated on Cholesterol goals; Benefits of statin for ASCVD risk reduction; -Recommended to continue current medication  Depression/Anxiety (Goal: manage symptoms) -Not ideally controlled - pt just switched citalopram last week due to concerns for waning efficacy; he took escitalopram for 2 days and had diarrhea which resolved upon stopping the drug; he is currently under a lot of stress due to upcoming urology appt for prostate cancer screening;  currently he is not taking anything for mood (has been off meds about a week) -Current treatment: Escitalopram 10 mg daily -Medications previously tried/failed: citalopram -PHQ9: 0 (08/2019) -GAD7: 0 (08/2019) -Connected with PCP for mental health support -Educated on SSRI class and potential for diarrhea with any agent; pt never had any issues with citalopram; it is not ideal for him to be off medication during this high-stress period -Recommend to restart citalopram 20 mg daily; may consider switching SSRI after urology work-up if needed  BPH (Goal: manage symptoms) -Controlled - PSA is recently elevated and pt has urology appt tomorrow for prostate cancer screening; pt denies urinary symptoms -Current treatment  Dutasteride 0.5 mg daily -Educated on explanations for PSA elevations -Recommended to continue current medication  Health Maintenance -Vaccine gaps: Shingrix, Covid booster dose 4 -pt report he got 4th covid shot 3 weeks ago -Current therapy:  Vitamin D 2000 IU daily -Patient is satisfied with current therapy and denies issues -Advised to get Shingrix vaccine at local pharmacy  Patient Goals/Self-Care Activities Patient will:  - take medications as prescribed focus on medication adherence by routine check blood pressure daily, document, and provide at future appointments target a minimum of 150 minutes of moderate intensity exercise weekly -Restart citalopram 20 mg daily. Dispose of escitalopram      Medication Assistance: None required.  Patient affirms current coverage meets needs.  Compliance/Adherence/Medication fill history: Care Gaps: Hepatitis C screening Shingrix Covid booster dose #4 (due 05/02/20)  Star-Rating Drugs: Atorvastatin - LF 07/13/20 x 90 ds Telmisartan - LF 10/02/20 x 90 ds  Patient's preferred pharmacy is:  [facility] - [place], [place] - [address] [place] [address] Phone:  [phone] Fax: [phone]  [id number] - [place], [place] - [address] [place] [address] Phone: [phone] Fax: [phone]  Uses pill box? No - prefers bottles Pt endorses 100% compliance  We discussed: Current pharmacy is preferred with insurance plan and patient is satisfied with pharmacy services Patient decided to: Continue current medication management strategy  Care Plan and Follow Up Patient Decision:  Patient agrees to Care Plan and Follow-up.  Plan: Telephone follow up appointment   with care management team member scheduled for:  6 months  Lindsey Foltanski, PharmD, BCACP, CPP Clinical Pharmacist [facility] [phone]  

## 2020-10-19 ENCOUNTER — Other Ambulatory Visit: Payer: Self-pay | Admitting: Internal Medicine

## 2020-10-19 DIAGNOSIS — K580 Irritable bowel syndrome with diarrhea: Secondary | ICD-10-CM

## 2020-10-19 DIAGNOSIS — F418 Other specified anxiety disorders: Secondary | ICD-10-CM

## 2020-10-19 MED ORDER — CLONAZEPAM 0.5 MG PO TABS: 0.5000 mg | ORAL_TABLET | Freq: Three times a day (TID) | ORAL | 2 refills | Status: DC | PRN

## 2020-10-19 MED ORDER — DIPHENOXYLATE-ATROPINE 2.5-0.025 MG PO TABS: 1.0000 | ORAL_TABLET | Freq: Four times a day (QID) | ORAL | 2 refills | Status: DC | PRN

## 2020-11-25 ENCOUNTER — Other Ambulatory Visit: Payer: Self-pay | Admitting: Urology

## 2020-11-25 ENCOUNTER — Other Ambulatory Visit (HOSPITAL_COMMUNITY): Payer: Self-pay | Admitting: Urology

## 2020-11-25 DIAGNOSIS — C61 Malignant neoplasm of prostate: Secondary | ICD-10-CM

## 2020-12-02 ENCOUNTER — Encounter (HOSPITAL_COMMUNITY)
Admission: RE | Admit: 2020-12-02 | Discharge: 2020-12-02 | Disposition: A | Discharge status: Home / Self Care | Payer: Medicare Other | Source: Ambulatory Visit | Attending: Urology | Admitting: Urology

## 2020-12-02 ENCOUNTER — Other Ambulatory Visit: Payer: Self-pay

## 2020-12-02 DIAGNOSIS — K4021 Bilateral inguinal hernia, without obstruction or gangrene, recurrent: Secondary | ICD-10-CM | POA: Diagnosis not present

## 2020-12-02 DIAGNOSIS — M19011 Primary osteoarthritis, right shoulder: Secondary | ICD-10-CM | POA: Diagnosis not present

## 2020-12-02 DIAGNOSIS — C61 Malignant neoplasm of prostate: Secondary | ICD-10-CM | POA: Insufficient documentation

## 2020-12-02 DIAGNOSIS — K439 Ventral hernia without obstruction or gangrene: Secondary | ICD-10-CM | POA: Diagnosis not present

## 2020-12-02 DIAGNOSIS — K5730 Diverticulosis of large intestine without perforation or abscess without bleeding: Secondary | ICD-10-CM | POA: Diagnosis not present

## 2020-12-02 DIAGNOSIS — M170 Bilateral primary osteoarthritis of knee: Secondary | ICD-10-CM | POA: Diagnosis not present

## 2020-12-02 DIAGNOSIS — M19012 Primary osteoarthritis, left shoulder: Secondary | ICD-10-CM | POA: Diagnosis not present

## 2020-12-02 MED ORDER — TECHNETIUM TC 99M MEDRONATE IV KIT
20.7000 | PACK | Freq: Once | INTRAVENOUS | Status: AC
  Administered 2020-12-02: 20.7 via INTRAVENOUS

## 2020-12-12 ENCOUNTER — Encounter: Payer: Self-pay | Admitting: Internal Medicine

## 2020-12-19 NOTE — Progress Notes (Signed)
GU Location of Tumor / Histology: Prostate  If Prostate Cancer, Gleason Score is ( 4+3), PSA (9.37), and Prostate volume (41.05gram)  Daniel Howe presented  months ago with signs/symptoms of:   Biopsies revealed: Media InformationMedia Information             Past/Anticipated interventions by urology, if any:   Past/Anticipated interventions by medical oncology, if any:   Weight changes, if any: none  IPSS Score: 4 SHIM Score:7  Bowel/Bladder complaints, if any: diarrhea takes imodium   Nausea/Vomiting, if any: none  Pain issues, if any:  no  SAFETY ISSUES: Prior radiation? no Pacemaker/ICD? no Possible current pregnancy? no Is the patient on methotrexate? no  Current Complaints / other details:   Vitals:   12/24/20 0806  BP: 132/79  Pulse: 96  Resp: 18  Temp: (!) 96.7 F (35.9 C)  TempSrc: Temporal  SpO2: 100%  Weight: 100.3 kg  Height: 6' 2.5" (1.892 m)

## 2020-12-24 ENCOUNTER — Ambulatory Visit
Admission: RE | Admit: 2020-12-24 | Discharge: 2020-12-24 | Disposition: A | Discharge status: Home / Self Care | Payer: Medicare Other | Source: Ambulatory Visit | Attending: Radiation Oncology | Admitting: Radiation Oncology

## 2020-12-24 ENCOUNTER — Encounter: Payer: Self-pay | Admitting: Radiation Oncology

## 2020-12-24 ENCOUNTER — Other Ambulatory Visit: Payer: Self-pay

## 2020-12-24 VITALS — BP 132/79 | HR 96 | Temp 96.7°F | Resp 18 | Ht 74.5 in | Wt 221.1 lb

## 2020-12-24 DIAGNOSIS — C61 Malignant neoplasm of prostate: Secondary | ICD-10-CM | POA: Diagnosis present

## 2020-12-24 DIAGNOSIS — I10 Essential (primary) hypertension: Secondary | ICD-10-CM | POA: Insufficient documentation

## 2020-12-24 DIAGNOSIS — Z79899 Other long term (current) drug therapy: Secondary | ICD-10-CM | POA: Diagnosis not present

## 2020-12-24 NOTE — Progress Notes (Signed)
Radiation Oncology         [phone] ________________________________  Initial Outpatient Consultation  Name: Daniel Howe MRN: [id number]  Date: 12/24/2020  DOB: Dec 09, 1940  [id number], Arvid Right, MD  Remi Haggard, MD   REFERRING PHYSICIAN: Remi Haggard, MD  DIAGNOSIS: 80 y.o. gentleman with Stage T2a adenocarcinoma of the prostate with Gleason score of 4+3, and PSA of 9.37 (18.8 adjusted for dutasteride).    ICD-10-CM   1. Malignant neoplasm of prostate ([place])  C61       HISTORY OF PRESENT ILLNESS: Daniel Howe is a 80 y.o. male with a diagnosis of prostate cancer. He has a history of BPH with BOO and has been on Avodart for the past year. He was noted to have an elevated PSA of 9.37 (18.8 adjusted for dutasteride) on 10/02/20 by his primary care physician, Dr. Ronnald Ramp. Accordingly, he was referred for evaluation in urology by Dr. Milford Cage on 10/14/20,  digital rectal examination was performed at that time revealing firmness of the left lobe.  The patient proceeded to transrectal ultrasound with 12 biopsies of the prostate on 11/14/20.  The prostate volume measured 41.05 cc.  Out of 12 core biopsies, all 12 were positive.  The maximum Gleason score was 4+3, and this was seen in the left base lateral and left apex lateral (both with perineural invasion). The remaining 10 cores contained Gleason 3+4 disease.  He underwent staging CT A/P and bone scan on 12/02/20, both of which were negative for metastatic disease.  The patient reviewed the biopsy results with his urologist and he has kindly been referred today for discussion of potential radiation treatment options.   PREVIOUS RADIATION THERAPY: No  PAST MEDICAL HISTORY:  Past Medical History:  Diagnosis Date   Anxiety    Depression    Hypertension       PAST SURGICAL HISTORY: Past Surgical History:  Procedure Laterality Date   CATARACT EXTRACTION     CORNEAL TRANSPLANT     HERNIA REPAIR  03/09/12   lih repair    INGUINAL HERNIA REPAIR  03/09/2012   Procedure: HERNIA REPAIR INGUINAL ADULT;  Surgeon: Pedro Earls, MD;  Location: [facility];  Service: General;  Laterality: Left;    FAMILY HISTORY:  Family History  Problem Relation Age of Onset   Cancer Neg Hx    Early death Neg Hx    Hyperlipidemia Neg Hx    Hypertension Neg Hx    Diabetes Brother    Heart disease Brother     SOCIAL HISTORY:  Social History   Socioeconomic History   Marital status: Married    Spouse name: Not on file   Number of children: 2   Years of education: Not on file   Highest education level: Not on file  Occupational History   Occupation: Retired    Fish farm manager: Proofreader    Comment: Sales  Tobacco Use   Smoking status: Never   Smokeless tobacco: Never  Substance and Sexual Activity   Alcohol use: Yes    Alcohol/week: 14.0 standard drinks    Types: 14 Glasses of wine per week   Drug use: No   Sexual activity: Yes  Other Topics Concern   Not on file  Social History Narrative   Not on file   Social Determinants of Health   Financial Resource Strain: Low Risk    Difficulty of Paying Living Expenses: Not hard at all  Food Insecurity: Not on file  Transportation Needs: Not  on file  Physical Activity: Not on file  Stress: Not on file  Social Connections: Not on file  Intimate Partner Violence: Not on file    ALLERGIES: Escitalopram  MEDICATIONS:  Current Outpatient Medications  Medication Sig Dispense Refill   atorvastatin (LIPITOR) 40 MG tablet TAKE 1 TABLET ONCE DAILY. 90 tablet 1   Cholecalciferol (VITAMIN D) 50 MCG (2000 UT) CAPS Take 1 capsule by mouth daily.     citalopram (CELEXA) 20 MG tablet Take 20 mg by mouth daily.     clonazePAM (KLONOPIN) 0.5 MG tablet Take 1 tablet (0.5 mg total) by mouth 3 (three) times daily as needed for anxiety. 90 tablet 2   diphenoxylate-atropine (LOMOTIL) 2.5-0.025 MG tablet Take 1 tablet by mouth 4 (four) times daily as needed for diarrhea or loose stools. 90  tablet 2   dutasteride (AVODART) 0.5 MG capsule TAKE (1) CAPSULE DAILY. 90 capsule 1   telmisartan (MICARDIS) 40 MG tablet Take 1 tablet (40 mg total) by mouth daily. 90 tablet 1   No current facility-administered medications for this encounter.    REVIEW OF SYSTEMS:  On review of systems, the patient reports that he is doing well overall. He denies any chest pain, shortness of breath, cough, fevers, chills, night sweats, unintended weight changes. He denies abdominal pain, nausea or vomiting. He denies any new musculoskeletal or joint aches or pains. His IPSS was 4, indicating mild urinary symptoms. He also reports diarrhea, for which he uses imodium. His SHIM was 7, indicating he has severe erectile dysfunction. A complete review of systems is obtained and is otherwise negative.    PHYSICAL EXAM:  Wt Readings from Last 3 Encounters:  12/24/20 221 lb 2 oz (100.3 kg)  10/02/20 223 lb (101.2 kg)  04/17/20 239 lb (108.4 kg)   Temp Readings from Last 3 Encounters:  12/24/20 (!) 96.7 F (35.9 C) (Temporal)  10/02/20 98.5 F (36.9 C) (Oral)  04/17/20 98.1 F (36.7 C) (Oral)   BP Readings from Last 3 Encounters:  12/24/20 132/79  10/02/20 (!) 142/86  04/17/20 (!) 144/86   Pulse Readings from Last 3 Encounters:  12/24/20 96  10/02/20 96  04/17/20 98   Pain Assessment Pain Score: 0-No pain/10  In general this is a well appearing Caucasian male in no acute distress. He's alert and oriented x4 and appropriate throughout the examination. Cardiopulmonary assessment is negative for acute distress, and he exhibits normal effort.     KPS = 100  100 - Normal; no complaints; no evidence of disease. 90   - Able to carry on normal activity; minor signs or symptoms of disease. 80   - Normal activity with effort; some signs or symptoms of disease. 72   - Cares for self; unable to carry on normal activity or to do active work. 60   - Requires occasional assistance, but is able to care for  most of his personal needs. 50   - Requires considerable assistance and frequent medical care. 44   - Disabled; requires special care and assistance. 45   - Severely disabled; hospital admission is indicated although death not imminent. 60   - Very sick; hospital admission necessary; active supportive treatment necessary. 10   - Moribund; fatal processes progressing rapidly. 0     - Dead  Karnofsky DA, Abelmann WH, Craver LS and Burchenal [facility] [phone]) The use of the nitrogen mustards in the palliative treatment of carcinoma: with particular reference to bronchogenic carcinoma Cancer 1 634-56  LABORATORY  DATA:  Lab Results  Component Value Date   WBC 8.7 04/17/2020   HGB 14.5 04/17/2020   HCT 40.5 04/17/2020   MCV 95.3 04/17/2020   PLT 234.0 04/17/2020   Lab Results  Component Value Date   NA 135 10/02/2020   K 4.7 10/02/2020   CL 105 10/02/2020   CO2 17 (L) 10/02/2020   Lab Results  Component Value Date   ALT 41 04/17/2020   AST 27 04/17/2020   ALKPHOS 55 04/17/2020   BILITOT 0.9 04/17/2020     RADIOGRAPHY: NM Bone Scan Whole Body  Result Date: 12/03/2020 CLINICAL DATA:  Recent diagnosis prostate cancer, evaluate for metastatic disease EXAM: NUCLEAR MEDICINE WHOLE BODY BONE SCAN TECHNIQUE: Whole body anterior and posterior images were obtained approximately 3 hours after intravenous injection of radiopharmaceutical. RADIOPHARMACEUTICALS:  20.7 mCi Technetium-73mMDP IV COMPARISON:  None. FINDINGS: No suspicious foci of osseous radiotracer uptake. Degenerative uptake of the shoulders, wrists, lumbar spine, sacroiliac joints, and knees bilaterally. Expected urinary tract uptake and excretion. IMPRESSION: No osseous radiotracer uptake suspicious for metastatic disease on nuclear scintigraphic whole body bone scan. Electronically Signed   By: AEddie CandleM.D.   On: 12/03/2020 10:32      IMPRESSION/PLAN: 1. 80y.o. gentleman with Stage T2a adenocarcinoma of the prostate with Gleason  Score of 4+3, and PSA of 8.37 (18.8 adjusted for dutasteride).  We discussed the patient's workup and outlined the nature of prostate cancer in this setting. The patient's T stage, Gleason's score, and PSA put him into the unfavorable intermediate risk group. Accordingly, he is eligible for a variety of potential treatment options including prostatectomy or brachytherapy or 5.5 weeks of external radiation, +/- ADT . We discussed the available radiation techniques, and focused on the details and logistics of delivery. We discussed and outlined the risks, benefits, short and long-term effects associated with radiotherapy and compared and contrasted these with prostatectomy. We discussed the role of SpaceOAR gel in reducing the rectal toxicity associated with radiotherapy. We also detailed the role of ADT in the treatment of unfavorable intermediate risk prostate cancer and outlined the associated side effects that could be expected with this therapy. He was encouraged to ask questions that were answered to his stated satisfaction.  At the conclusion of our conversation, the patient is interested in moving forward with 5.5 weeks of external beam therapy without ADT and reserving ADT for use in the future should his PSA continue to rise despite the radiation. We will share our discussion with Dr. NMilford Cageand make arrangements for fiducial markers and SpaceOAR gel placement, first available, prior to simulation, to reduce rectal toxicity from radiotherapy. The patient appears to have a good understanding of his disease and our treatment recommendations which are of curative intent and is in agreement with the stated plan.  Therefore, we will move forward with treatment planning accordingly, in anticipation of beginning IMRT in the near future. We enjoyed meeting him and his wife and look forward to continuing to participate in his care.  We personally spent 75 minutes in this encounter including chart review,  reviewing radiological studies, meeting face-to-face with the patient, entering orders and completing documentation.    ANicholos Johns PA-C    MTyler Pita MD  [facility] Direct Dial: [phone] Fax: [phone]  Skype  LinkedIn   This document serves as a record of services personally performed by MTyler Pita MD and AFreeman Caldron PA-C. It was created on their  behalf by Wilburn Mylar, a trained medical scribe. The creation of this record is based on the scribe's personal observations and the provider's statements to them. This document has been checked and approved by the attending provider.

## 2021-01-09 ENCOUNTER — Ambulatory Visit: Payer: Medicare Other | Admitting: Internal Medicine

## 2021-01-09 ENCOUNTER — Other Ambulatory Visit: Payer: Self-pay

## 2021-01-09 ENCOUNTER — Encounter: Payer: Self-pay | Admitting: Internal Medicine

## 2021-01-09 VITALS — BP 148/82 | HR 78 | Ht 74.5 in | Wt 218.6 lb

## 2021-01-09 DIAGNOSIS — I441 Atrioventricular block, second degree: Secondary | ICD-10-CM | POA: Diagnosis not present

## 2021-01-09 NOTE — Patient Instructions (Signed)

## 2021-01-09 NOTE — Progress Notes (Signed)
Patient Care Team: Janith Lima, MD as PCP - General (Internal Medicine) Charlton Haws, [facility] as Pharmacist (Pharmacist)   HPI  Daniel Howe is a 80 y.o. male Seen  in follow-up of an abnormal Holter monitor undertaken by his PCP for bradycardia. (9/21) It was personally reviewed.  He had 3+ second pauses during the day with multiple nonconducted P waves.  There was concomitant PP prolongation suggestive of hyper vagotonia.     The patient denies chest pain\, shortness of breath, nocturnal dyspnea, orthopnea or peripheral edema.  There have been no palpitations, lightheadedness or syncope  Describes significant stress this summer related to the diagnosis of prostate cancer, the delay with his scans, the delay with his seed implantation.  However he has had great news, the scans were negative and his tumor is slow-growing..   Date PR Interval  6/13 242  5/18 500(about)  3/20 540  12/20 >600 ? junctional  9/22  420      Date Cr K TSH Hgb  3/20 1.74 4.7 4.03 14.3   4/21 1.62 4.2 1.32   9/21 1.69 4.1    12/21 1.88 4.6  14.5  6/22 1.63 4.7 4.32 (4/21)     Records and Results Reviewed *  Past Medical History:  Diagnosis Date   Anxiety    Depression    Hypertension     Past Surgical History:  Procedure Laterality Date   CATARACT EXTRACTION     CORNEAL TRANSPLANT     HERNIA REPAIR  03/09/12   lih repair   INGUINAL HERNIA REPAIR  03/09/2012   Procedure: HERNIA REPAIR INGUINAL ADULT;  Surgeon: Pedro Earls, MD;  Location: [facility];  Service: General;  Laterality: Left;    No outpatient medications have been marked as taking for the 01/09/21 encounter (Appointment) with Deboraha Sprang, MD.    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  This may be partly related to the stresses of the summer.  Lengthy discussion regarding stress, management strategies including gratitude.  First-degree AV block has shortened.  Not quite sure why, may be relationship to his weight.  No symptoms for his heart block.  We will continue to follow    I,Jada Bradford,acting as a scribe for Virl Axe, MD.,have documented all relevant documentation on the behalf of Virl Axe, MD,as directed by  Virl Axe, MD while in the presence of Virl Axe, MD.  I, Virl Axe, MD, have reviewed all documentation for this visit. The documentation on 01/09/21 for the exam,  diagnosis, procedures, and orders are all accurate and complete.

## 2021-01-17 ENCOUNTER — Encounter: Payer: Self-pay | Admitting: Urology

## 2021-01-17 NOTE — Progress Notes (Signed)
Patient is scheduled for fiducial markers and SpaceOAR gel on 02/06/2021 so I have requested that Romie Jumper please coordinate his CT [facility] for the week of October 10 in anticipation of beginning his daily treatment shortly thereafter.  Nicholos Johns, MMS, PA-C [place] [facility] [place]: [phone]  Fax: [phone]

## 2021-02-07 ENCOUNTER — Telehealth: Payer: Self-pay | Admitting: *Deleted

## 2021-02-07 NOTE — Telephone Encounter (Signed)
CALLED PATIENT TO REMIND OF SIM APPT. FOR 02-10-21- ARRIVAL TIME- 8:15 AM @ [facility], SPOKE WITH PATIENT AND HE IS AWARE OF THIS APPT.

## 2021-02-10 ENCOUNTER — Other Ambulatory Visit: Payer: Self-pay

## 2021-02-10 ENCOUNTER — Ambulatory Visit
Admission: RE | Admit: 2021-02-10 | Discharge: 2021-02-10 | Disposition: A | Discharge status: Home / Self Care | Payer: Medicare Other | Source: Ambulatory Visit | Attending: Radiation Oncology | Admitting: Radiation Oncology

## 2021-02-10 DIAGNOSIS — Z510 Encounter for antineoplastic radiation therapy: Secondary | ICD-10-CM | POA: Diagnosis present

## 2021-02-10 DIAGNOSIS — C61 Malignant neoplasm of prostate: Secondary | ICD-10-CM | POA: Insufficient documentation

## 2021-02-10 NOTE — Progress Notes (Signed)
  Radiation Oncology         [phone] ________________________________  Name: Daniel Howe MRN: [id number]  Date: 02/10/2021  DOB: August 04, 1940  SIMULATION AND TREATMENT PLANNING NOTE    ICD-10-CM   1. Malignant neoplasm of prostate ([place])  C61       DIAGNOSIS:  80 y.o. gentleman with Stage T2a adenocarcinoma of the prostate with Gleason score of 4+3, and PSA of 9.37 (18.8 adjusted for dutasteride).  NARRATIVE:  The patient was brought to the [place].  Identity was confirmed.  All relevant records and images related to the planned course of therapy were reviewed.  The patient freely provided informed written consent to proceed with treatment after reviewing the details related to the planned course of therapy. The consent form was witnessed and verified by the simulation staff.  Then, the patient was set-up in a stable reproducible supine position for radiation therapy.  A vacuum lock pillow device was custom fabricated to position his legs in a reproducible immobilized position.  Then, I performed a urethrogram under sterile conditions to identify the prostatic apex.  CT images were obtained.  Surface markings were placed.  The CT images were loaded into the planning software.  Then the prostate target and avoidance structures including the rectum, bladder, bowel and hips were contoured.  Treatment planning then occurred.  The radiation prescription was entered and confirmed.  A total of one complex treatment devices was fabricated. I have requested : Intensity Modulated Radiotherapy (IMRT) is medically necessary for this case for the following reason:  Rectal sparing.Marland Kitchen  PLAN:  The patient will receive 70 Gy in 28 fractions.  ________________________________  Sheral Apley Tammi Klippel, M.D.

## 2021-02-17 DIAGNOSIS — Z510 Encounter for antineoplastic radiation therapy: Secondary | ICD-10-CM | POA: Diagnosis not present

## 2021-02-19 ENCOUNTER — Other Ambulatory Visit: Payer: Self-pay

## 2021-02-19 ENCOUNTER — Ambulatory Visit
Admission: RE | Admit: 2021-02-19 | Discharge: 2021-02-19 | Disposition: A | Discharge status: Home / Self Care | Payer: Medicare Other | Source: Ambulatory Visit | Attending: Radiation Oncology | Admitting: Radiation Oncology

## 2021-02-19 DIAGNOSIS — Z510 Encounter for antineoplastic radiation therapy: Secondary | ICD-10-CM | POA: Diagnosis not present

## 2021-02-20 ENCOUNTER — Ambulatory Visit
Admission: RE | Admit: 2021-02-20 | Discharge: 2021-02-20 | Disposition: A | Discharge status: Home / Self Care | Payer: Medicare Other | Source: Ambulatory Visit | Attending: Radiation Oncology | Admitting: Radiation Oncology

## 2021-02-20 DIAGNOSIS — Z510 Encounter for antineoplastic radiation therapy: Secondary | ICD-10-CM | POA: Diagnosis not present

## 2021-02-21 ENCOUNTER — Ambulatory Visit
Admission: RE | Admit: 2021-02-21 | Discharge: 2021-02-21 | Disposition: A | Discharge status: Home / Self Care | Payer: Medicare Other | Source: Ambulatory Visit | Attending: Radiation Oncology | Admitting: Radiation Oncology

## 2021-02-21 ENCOUNTER — Other Ambulatory Visit: Payer: Self-pay

## 2021-02-21 DIAGNOSIS — Z510 Encounter for antineoplastic radiation therapy: Secondary | ICD-10-CM | POA: Diagnosis not present

## 2021-02-24 ENCOUNTER — Other Ambulatory Visit: Payer: Self-pay

## 2021-02-24 ENCOUNTER — Ambulatory Visit
Admission: RE | Admit: 2021-02-24 | Discharge: 2021-02-24 | Disposition: A | Discharge status: Home / Self Care | Payer: Medicare Other | Source: Ambulatory Visit | Attending: Radiation Oncology | Admitting: Radiation Oncology

## 2021-02-24 DIAGNOSIS — Z510 Encounter for antineoplastic radiation therapy: Secondary | ICD-10-CM | POA: Diagnosis not present

## 2021-02-25 ENCOUNTER — Ambulatory Visit
Admission: RE | Admit: 2021-02-25 | Discharge: 2021-02-25 | Disposition: A | Discharge status: Home / Self Care | Payer: Medicare Other | Source: Ambulatory Visit | Attending: Radiation Oncology | Admitting: Radiation Oncology

## 2021-02-25 DIAGNOSIS — Z510 Encounter for antineoplastic radiation therapy: Secondary | ICD-10-CM | POA: Diagnosis not present

## 2021-02-26 ENCOUNTER — Ambulatory Visit
Admission: RE | Admit: 2021-02-26 | Discharge: 2021-02-26 | Disposition: A | Discharge status: Home / Self Care | Payer: Medicare Other | Source: Ambulatory Visit | Attending: Radiation Oncology | Admitting: Radiation Oncology

## 2021-02-26 DIAGNOSIS — Z510 Encounter for antineoplastic radiation therapy: Secondary | ICD-10-CM | POA: Diagnosis not present

## 2021-02-27 ENCOUNTER — Ambulatory Visit
Admission: RE | Admit: 2021-02-27 | Discharge: 2021-02-27 | Disposition: A | Discharge status: Home / Self Care | Payer: Medicare Other | Source: Ambulatory Visit | Attending: Radiation Oncology | Admitting: Radiation Oncology

## 2021-02-27 DIAGNOSIS — Z510 Encounter for antineoplastic radiation therapy: Secondary | ICD-10-CM | POA: Diagnosis not present

## 2021-02-28 ENCOUNTER — Ambulatory Visit
Admission: RE | Admit: 2021-02-28 | Discharge: 2021-02-28 | Disposition: A | Discharge status: Home / Self Care | Payer: Medicare Other | Source: Ambulatory Visit | Attending: Radiation Oncology | Admitting: Radiation Oncology

## 2021-02-28 ENCOUNTER — Other Ambulatory Visit: Payer: Self-pay

## 2021-02-28 DIAGNOSIS — Z510 Encounter for antineoplastic radiation therapy: Secondary | ICD-10-CM | POA: Diagnosis not present

## 2021-03-03 ENCOUNTER — Other Ambulatory Visit: Payer: Self-pay

## 2021-03-03 ENCOUNTER — Ambulatory Visit
Admission: RE | Admit: 2021-03-03 | Discharge: 2021-03-03 | Disposition: A | Discharge status: Home / Self Care | Payer: Medicare Other | Source: Ambulatory Visit | Attending: Radiation Oncology | Admitting: Radiation Oncology

## 2021-03-03 DIAGNOSIS — Z510 Encounter for antineoplastic radiation therapy: Secondary | ICD-10-CM | POA: Diagnosis not present

## 2021-03-04 ENCOUNTER — Ambulatory Visit
Admission: RE | Admit: 2021-03-04 | Discharge: 2021-03-04 | Disposition: A | Discharge status: Home / Self Care | Payer: Medicare Other | Source: Ambulatory Visit | Attending: Radiation Oncology | Admitting: Radiation Oncology

## 2021-03-04 DIAGNOSIS — C61 Malignant neoplasm of prostate: Secondary | ICD-10-CM | POA: Diagnosis present

## 2021-03-04 DIAGNOSIS — Z510 Encounter for antineoplastic radiation therapy: Secondary | ICD-10-CM | POA: Insufficient documentation

## 2021-03-05 ENCOUNTER — Other Ambulatory Visit: Payer: Self-pay

## 2021-03-05 ENCOUNTER — Ambulatory Visit
Admission: RE | Admit: 2021-03-05 | Discharge: 2021-03-05 | Disposition: A | Discharge status: Home / Self Care | Payer: Medicare Other | Source: Ambulatory Visit | Attending: Radiation Oncology | Admitting: Radiation Oncology

## 2021-03-05 DIAGNOSIS — Z510 Encounter for antineoplastic radiation therapy: Secondary | ICD-10-CM | POA: Diagnosis not present

## 2021-03-06 ENCOUNTER — Ambulatory Visit
Admission: RE | Admit: 2021-03-06 | Discharge: 2021-03-06 | Disposition: A | Discharge status: Home / Self Care | Payer: Medicare Other | Source: Ambulatory Visit | Attending: Radiation Oncology | Admitting: Radiation Oncology

## 2021-03-06 DIAGNOSIS — Z510 Encounter for antineoplastic radiation therapy: Secondary | ICD-10-CM | POA: Diagnosis not present

## 2021-03-07 ENCOUNTER — Ambulatory Visit
Admission: RE | Admit: 2021-03-07 | Discharge: 2021-03-07 | Disposition: A | Discharge status: Home / Self Care | Payer: Medicare Other | Source: Ambulatory Visit | Attending: Radiation Oncology | Admitting: Radiation Oncology

## 2021-03-07 ENCOUNTER — Other Ambulatory Visit: Payer: Self-pay

## 2021-03-07 DIAGNOSIS — Z510 Encounter for antineoplastic radiation therapy: Secondary | ICD-10-CM | POA: Diagnosis not present

## 2021-03-10 ENCOUNTER — Ambulatory Visit
Admission: RE | Admit: 2021-03-10 | Discharge: 2021-03-10 | Disposition: A | Discharge status: Home / Self Care | Payer: Medicare Other | Source: Ambulatory Visit | Attending: Radiation Oncology | Admitting: Radiation Oncology

## 2021-03-10 ENCOUNTER — Other Ambulatory Visit: Payer: Self-pay

## 2021-03-10 DIAGNOSIS — Z510 Encounter for antineoplastic radiation therapy: Secondary | ICD-10-CM | POA: Diagnosis not present

## 2021-03-11 ENCOUNTER — Ambulatory Visit
Admission: RE | Admit: 2021-03-11 | Discharge: 2021-03-11 | Disposition: A | Discharge status: Home / Self Care | Payer: Medicare Other | Source: Ambulatory Visit | Attending: Radiation Oncology | Admitting: Radiation Oncology

## 2021-03-11 DIAGNOSIS — Z510 Encounter for antineoplastic radiation therapy: Secondary | ICD-10-CM | POA: Diagnosis not present

## 2021-03-12 ENCOUNTER — Ambulatory Visit
Admission: RE | Admit: 2021-03-12 | Discharge: 2021-03-12 | Disposition: A | Discharge status: Home / Self Care | Payer: Medicare Other | Source: Ambulatory Visit | Attending: Radiation Oncology | Admitting: Radiation Oncology

## 2021-03-12 ENCOUNTER — Other Ambulatory Visit: Payer: Self-pay

## 2021-03-12 DIAGNOSIS — Z510 Encounter for antineoplastic radiation therapy: Secondary | ICD-10-CM | POA: Diagnosis not present

## 2021-03-13 ENCOUNTER — Ambulatory Visit
Admission: RE | Admit: 2021-03-13 | Discharge: 2021-03-13 | Disposition: A | Discharge status: Home / Self Care | Payer: Medicare Other | Source: Ambulatory Visit | Attending: Radiation Oncology | Admitting: Radiation Oncology

## 2021-03-13 DIAGNOSIS — Z510 Encounter for antineoplastic radiation therapy: Secondary | ICD-10-CM | POA: Diagnosis not present

## 2021-03-14 ENCOUNTER — Ambulatory Visit
Admission: RE | Admit: 2021-03-14 | Discharge: 2021-03-14 | Disposition: A | Discharge status: Home / Self Care | Payer: Medicare Other | Source: Ambulatory Visit | Attending: Radiation Oncology | Admitting: Radiation Oncology

## 2021-03-14 ENCOUNTER — Other Ambulatory Visit: Payer: Self-pay

## 2021-03-14 DIAGNOSIS — Z510 Encounter for antineoplastic radiation therapy: Secondary | ICD-10-CM | POA: Diagnosis not present

## 2021-03-17 ENCOUNTER — Other Ambulatory Visit: Payer: Self-pay

## 2021-03-17 ENCOUNTER — Ambulatory Visit
Admission: RE | Admit: 2021-03-17 | Discharge: 2021-03-17 | Disposition: A | Discharge status: Home / Self Care | Payer: Medicare Other | Source: Ambulatory Visit | Attending: Radiation Oncology | Admitting: Radiation Oncology

## 2021-03-17 DIAGNOSIS — Z510 Encounter for antineoplastic radiation therapy: Secondary | ICD-10-CM | POA: Diagnosis not present

## 2021-03-18 ENCOUNTER — Ambulatory Visit
Admission: RE | Admit: 2021-03-18 | Discharge: 2021-03-18 | Disposition: A | Discharge status: Home / Self Care | Payer: Medicare Other | Source: Ambulatory Visit | Attending: Radiation Oncology | Admitting: Radiation Oncology

## 2021-03-18 DIAGNOSIS — Z510 Encounter for antineoplastic radiation therapy: Secondary | ICD-10-CM | POA: Diagnosis not present

## 2021-03-19 ENCOUNTER — Ambulatory Visit
Admission: RE | Admit: 2021-03-19 | Discharge: 2021-03-19 | Disposition: A | Discharge status: Home / Self Care | Payer: Medicare Other | Source: Ambulatory Visit | Attending: Radiation Oncology | Admitting: Radiation Oncology

## 2021-03-19 DIAGNOSIS — Z510 Encounter for antineoplastic radiation therapy: Secondary | ICD-10-CM | POA: Diagnosis not present

## 2021-03-20 ENCOUNTER — Ambulatory Visit
Admission: RE | Admit: 2021-03-20 | Discharge: 2021-03-20 | Disposition: A | Discharge status: Home / Self Care | Payer: Medicare Other | Source: Ambulatory Visit | Attending: Radiation Oncology | Admitting: Radiation Oncology

## 2021-03-20 ENCOUNTER — Other Ambulatory Visit: Payer: Self-pay

## 2021-03-20 DIAGNOSIS — Z510 Encounter for antineoplastic radiation therapy: Secondary | ICD-10-CM | POA: Diagnosis not present

## 2021-03-21 ENCOUNTER — Ambulatory Visit
Admission: RE | Admit: 2021-03-21 | Discharge: 2021-03-21 | Disposition: A | Discharge status: Home / Self Care | Payer: Medicare Other | Source: Ambulatory Visit | Attending: Radiation Oncology | Admitting: Radiation Oncology

## 2021-03-21 DIAGNOSIS — Z510 Encounter for antineoplastic radiation therapy: Secondary | ICD-10-CM | POA: Diagnosis not present

## 2021-03-24 ENCOUNTER — Ambulatory Visit: Payer: Medicare Other

## 2021-03-25 ENCOUNTER — Ambulatory Visit: Payer: Medicare Other

## 2021-03-26 ENCOUNTER — Ambulatory Visit: Payer: Medicare Other

## 2021-03-31 ENCOUNTER — Ambulatory Visit
Admission: RE | Admit: 2021-03-31 | Discharge: 2021-03-31 | Disposition: A | Discharge status: Home / Self Care | Payer: Medicare Other | Source: Ambulatory Visit | Attending: Radiation Oncology | Admitting: Radiation Oncology

## 2021-03-31 ENCOUNTER — Other Ambulatory Visit: Payer: Self-pay

## 2021-03-31 DIAGNOSIS — Z510 Encounter for antineoplastic radiation therapy: Secondary | ICD-10-CM | POA: Diagnosis not present

## 2021-04-01 ENCOUNTER — Ambulatory Visit
Admission: RE | Admit: 2021-04-01 | Discharge: 2021-04-01 | Disposition: A | Discharge status: Home / Self Care | Payer: Medicare Other | Source: Ambulatory Visit | Attending: Radiation Oncology | Admitting: Radiation Oncology

## 2021-04-01 ENCOUNTER — Ambulatory Visit: Payer: Medicare Other

## 2021-04-01 ENCOUNTER — Other Ambulatory Visit: Payer: Self-pay

## 2021-04-01 DIAGNOSIS — Z510 Encounter for antineoplastic radiation therapy: Secondary | ICD-10-CM | POA: Diagnosis not present

## 2021-04-02 ENCOUNTER — Ambulatory Visit
Admission: RE | Admit: 2021-04-02 | Discharge: 2021-04-02 | Disposition: A | Discharge status: Home / Self Care | Payer: Medicare Other | Source: Ambulatory Visit | Attending: Radiation Oncology | Admitting: Radiation Oncology

## 2021-04-02 ENCOUNTER — Ambulatory Visit: Payer: Medicare Other

## 2021-04-02 DIAGNOSIS — Z510 Encounter for antineoplastic radiation therapy: Secondary | ICD-10-CM | POA: Diagnosis not present

## 2021-04-03 ENCOUNTER — Other Ambulatory Visit: Payer: Self-pay

## 2021-04-03 ENCOUNTER — Ambulatory Visit
Admission: RE | Admit: 2021-04-03 | Discharge: 2021-04-03 | Disposition: A | Discharge status: Home / Self Care | Payer: Medicare Other | Source: Ambulatory Visit | Attending: Radiation Oncology | Admitting: Radiation Oncology

## 2021-04-03 DIAGNOSIS — C61 Malignant neoplasm of prostate: Secondary | ICD-10-CM | POA: Diagnosis present

## 2021-04-03 DIAGNOSIS — Z510 Encounter for antineoplastic radiation therapy: Secondary | ICD-10-CM | POA: Diagnosis not present

## 2021-04-04 ENCOUNTER — Ambulatory Visit
Admission: RE | Admit: 2021-04-04 | Discharge: 2021-04-04 | Disposition: A | Discharge status: Home / Self Care | Payer: Medicare Other | Source: Ambulatory Visit | Attending: Radiation Oncology | Admitting: Radiation Oncology

## 2021-04-04 ENCOUNTER — Encounter: Payer: Self-pay | Admitting: Urology

## 2021-04-04 DIAGNOSIS — Z510 Encounter for antineoplastic radiation therapy: Secondary | ICD-10-CM | POA: Diagnosis not present

## 2021-04-04 DIAGNOSIS — C61 Malignant neoplasm of prostate: Secondary | ICD-10-CM

## 2021-04-07 ENCOUNTER — Telehealth: Payer: Medicare Other

## 2021-04-22 ENCOUNTER — Ambulatory Visit: Payer: Medicare Other

## 2021-04-22 NOTE — Progress Notes (Signed)
Called patient to have CCM appointment today, reports that Dr. Ronnald Ramp is no longer his PCP, new PCP is Dr. Sueanne Margarita - will remove from CCM roster at this time as he is no longer eligible   Tomasa Blase, PharmD Clinical Pharmacist, [place]

## 2021-05-07 ENCOUNTER — Encounter: Payer: Self-pay | Admitting: Urology

## 2021-05-07 NOTE — Progress Notes (Signed)
Patient states "Doing well." No symptoms reported at this time.  Meaningful use complete.  I-PSS score of 3 (mild). No urinary management medications at this time. No urology appointment at this time -per patient.  Patient notified of his 9:30am-05/08/21 telephone appointment w/ Ashlyn Bruning PA-C and verbalized understanding.  Patient preferred contact # [phone]

## 2021-05-08 ENCOUNTER — Ambulatory Visit
Admission: RE | Admit: 2021-05-08 | Discharge: 2021-05-08 | Disposition: A | Discharge status: Home / Self Care | Payer: Medicare Other | Source: Ambulatory Visit | Attending: Urology | Admitting: Urology

## 2021-05-15 ENCOUNTER — Telehealth: Payer: Self-pay | Admitting: *Deleted

## 2021-05-15 NOTE — Telephone Encounter (Signed)
RETURNED PATIENT'S PHONE CALL, SPOKE WITH PATIENT. ?

## 2021-05-23 ENCOUNTER — Encounter: Payer: Self-pay | Admitting: Urology

## 2021-05-23 NOTE — Progress Notes (Signed)
Spoke w/ patient's spouse, who is cleared to speak on patient's behalf. I verified the identity of 'Mrs. Daniel Howe', and begin nursing interview. She states "Mr. Daniel Howe is doing well." No symptoms reported at this time.  Meaningful use complete. I-PSS score of 2 (mild). No current urinary management medications at this time. Urology follow-up- None- per [facility]  Mrs. Daniel Howe notified of Mr. Daniel Howe 10:30am-05/30/21 telephone appointment w/ Freeman Caldron PA-C. I left my extension [phone] in case patient needs to call. Mrs. Daniel Howe verbalized understanding of information.  Patient preferred contact [phone]

## 2021-05-30 ENCOUNTER — Ambulatory Visit
Admission: RE | Admit: 2021-05-30 | Discharge: 2021-05-30 | Disposition: A | Discharge status: Home / Self Care | Payer: Medicare Other | Source: Ambulatory Visit | Attending: Urology | Admitting: Urology

## 2021-05-30 ENCOUNTER — Other Ambulatory Visit: Payer: Self-pay | Admitting: Urology

## 2021-05-30 DIAGNOSIS — C61 Malignant neoplasm of prostate: Secondary | ICD-10-CM

## 2021-05-30 NOTE — Progress Notes (Signed)
Radiation Oncology         [phone] ________________________________  Name: Daniel Howe MRN: [id number]  Date: 05/30/2021  DOB: May 16, 1940  Post Treatment Note  CC: Sueanne Margarita, DO  Remi Haggard, MD  Diagnosis:    81 y.o. gentleman with Stage T2a adenocarcinoma of the prostate with Gleason score of 4+3, and PSA of 9.37 (18.8 adjusted for dutasteride).  Interval Since Last Radiation:  8 weeks  02/19/21 - 04/04/21:  The prostate was treated to 70 Gy in 28 fractions of 2.5 Gy  Narrative:  I spoke with the patient to conduct his routine scheduled 1 month follow up visit via telephone to spare the patient unnecessary potential exposure in the healthcare setting during the current COVID-19 pandemic.  The patient was notified in advance and gave permission to proceed with this visit format.  He tolerated radiation treatment relatively well with only minor urinary irritation and modest fatigue.  He did nocturia x3 weaker flow of stream but specifically denied dysuria, gross hematuria, straining to void, incomplete bladder emptying or incontinence.  He also denied any abdominal pain or bowel issues.                              On review of systems, the patient states that he is doing well in general.  He continues with nocturia x2 per night but otherwise feels that his LUTS are gradually improving and close to baseline at this point.  He specifically denies dysuria, gross hematuria, straining to void, incomplete bladder emptying or incontinence.  He does have a little more flatus than normal but denies abdominal pain, nausea, vomiting, diarrhea or constipation.  He reports a healthy appetite and is maintaining his weight.  His energy level is gradually improving and he is looking forward to getting back to a regular exercise routine in the near future.  Overall, he is quite pleased with his progress to date.  ALLERGIES:  is allergic to escitalopram.  Meds: Current Outpatient  Medications  Medication Sig Dispense Refill   atorvastatin (LIPITOR) 40 MG tablet TAKE 1 TABLET ONCE DAILY. 90 tablet 1   Cholecalciferol (VITAMIN D) 50 MCG (2000 UT) CAPS Take 1 capsule by mouth daily.     citalopram (CELEXA) 20 MG tablet Take 20 mg by mouth daily.     clonazePAM (KLONOPIN) 0.5 MG tablet Take 1 tablet (0.5 mg total) by mouth 3 (three) times daily as needed for anxiety. 90 tablet 2   diphenoxylate-atropine (LOMOTIL) 2.5-0.025 MG tablet Take 1 tablet by mouth 4 (four) times daily as needed for diarrhea or loose stools. (Patient not taking: Reported on 05/23/2021) 90 tablet 2   dutasteride (AVODART) 0.5 MG capsule TAKE (1) CAPSULE DAILY. (Patient not taking: Reported on 01/09/2021) 90 capsule 1   telmisartan (MICARDIS) 40 MG tablet Take 1 tablet (40 mg total) by mouth daily. 90 tablet 1   No current facility-administered medications for this encounter.    Physical Findings:  vitals were not taken for this visit.  Pain Assessment Pain Score: 0-No pain/10 Unable to assess due to telephone follow-up visit format.  Lab Findings: Lab Results  Component Value Date   WBC 8.7 04/17/2020   HGB 14.5 04/17/2020   HCT 40.5 04/17/2020   MCV 95.3 04/17/2020   PLT 234.0 04/17/2020     Radiographic Findings: No results found.  Impression/Plan: 1.  81 y.o. gentleman with Stage T2a adenocarcinoma of the prostate with  Gleason score of 4+3, and PSA of 9.37 (18.8 adjusted for dutasteride). He will continue to follow up with urology for ongoing PSA determinations but does not currently have any follow-up appointments scheduled with Dr. Milford Cage to his knowledge.  We discussed the typical follow-up timeline for his first posttreatment PSA being around 3 months after completion of the radiation and that he will likely hear from one of Dr. Elmyra Ricks staff to get the appointment scheduled in the next week or so.  He understands what to expect with regards to PSA monitoring going forward. I will  look forward to following his response to treatment via correspondence with urology, and would be happy to continue to participate in his care if clinically indicated. I talked to the patient about what to expect in the future, including his risk for erectile dysfunction and rectal bleeding. I encouraged him to call or return to the office if he has any questions regarding his previous radiation or possible radiation side effects. He was comfortable with this plan and will follow up as needed.    Nicholos Johns, PA-C

## 2021-05-30 NOTE — Progress Notes (Signed)
°  Radiation Oncology         [phone] ________________________________  Name: Daniel Howe MRN: [id number]  Date: 04/04/2021  DOB: 02/13/1941  End of Treatment Note  Diagnosis:    81 y.o. gentleman with Stage T2a adenocarcinoma of the prostate with Gleason score of 4+3, and PSA of 9.37 (18.8 adjusted for dutasteride).     Indication for treatment:  Curative, Definitive Radiotherapy       Radiation treatment dates:   02/19/21 - 04/04/21  Site/dose:   The prostate was treated to 70 Gy in 28 fractions of 2.5 Gy  Beams/energy:   The patient was treated with IMRT using volumetric arc therapy delivering 6 MV X-rays to clockwise and counterclockwise circumferential arcs with a 90 degree collimator offset to avoid dose scalloping.  Image guidance was performed with daily cone beam CT prior to each fraction to align to gold markers in the prostate and assure proper bladder and rectal fill volumes.  Immobilization was achieved with BodyFix custom mold.  Narrative: The patient tolerated radiation treatment relatively well with only minor urinary irritation and modest fatigue.  He did nocturia x3 weaker flow of stream but specifically denied dysuria, gross hematuria, straining to void, incomplete bladder emptying or incontinence.  He also denied any abdominal pain or bowel issues.  Plan: The patient has completed radiation treatment. He will return to radiation oncology clinic for routine followup in one month. I advised him to call or return sooner if he has any questions or concerns related to his recovery or treatment. ________________________________  Sheral Apley. Tammi Klippel, M.D.

## 2021-07-04 ENCOUNTER — Telehealth: Payer: Self-pay | Admitting: *Deleted

## 2021-07-08 ENCOUNTER — Inpatient Hospital Stay: Payer: Medicare Other | Attending: Urology | Admitting: *Deleted

## 2021-07-08 ENCOUNTER — Encounter: Payer: Self-pay | Admitting: *Deleted

## 2021-07-08 ENCOUNTER — Other Ambulatory Visit: Payer: Self-pay

## 2021-07-08 VITALS — BP 145/80 | HR 79 | Temp 98.3°F | Resp 18 | Ht 74.5 in | Wt 216.3 lb

## 2021-07-08 DIAGNOSIS — C61 Malignant neoplasm of prostate: Secondary | ICD-10-CM

## 2021-07-08 NOTE — Progress Notes (Signed)
?  Pt's vital signs are WNL. Pt denies pain. SCP reviewed and completed. Pt has very little side effects from the radiation concerning urinary status. Pt is only getting up twice at the most to bathroom. No urgency and feels like he is emptying bladder completely. Pt states he is sleeping well. Pt expressed his left hernia is bulging somewhat at times. He will address concerns to doctor. No pain.  Pt is not exercising as much as he would like but I encouraged him to increase back slowly until he gets back to normal routine. Last colonoscopy was in 2013. No polyps. Pt visits dermatologist every six months for skin check. Vaccines are updated. I called [facility] for patient to see when he will have a follow-up with Dr. Milford Cage. The nurse said she would give pt a call. ?

## 2021-07-10 NOTE — Progress Notes (Signed)
Patient is scheduled for a PSA on 3/28 at [facility], and follow up with Dr. Milford Cage on 4/3.  Pt aware of these appointments.  ?

## 2021-08-29 ENCOUNTER — Other Ambulatory Visit: Payer: Self-pay

## 2021-08-29 ENCOUNTER — Ambulatory Visit: Payer: Self-pay | Admitting: General Surgery

## 2021-08-29 ENCOUNTER — Encounter (HOSPITAL_COMMUNITY): Payer: Self-pay | Admitting: General Surgery

## 2021-08-29 NOTE — H&P (Signed)
?Chief Complaint: Inguinal Hernia ?  ?  ?  ?History of Present Illness: ?Daniel Howe is a 81 y.o. male who is seen today as an office consultation at the request of Dr. Milford Cage for evaluation of Inguinal Hernia ?.   ?  ?Patient is a 81 year old male who comes in with a history of prostate cancer status post radiation.  Patient had previous left-sided inguinal hernia repair in 2014 by Dr. Hassell Done.  This was done via the open fashion.  Patient states that since December he has noticed that he had a bulge to the left inguinal area.  Patient also had a bulge in the right side.  He states he does have some pain and discomfort with straining. ?  ?Patient recently underwent a CT scan for his prostate cancer.  I did review the CT scan personally.  This does show bilateral fat-containing inguinal hernias as well as an umbilical hernia. ?  ?I reviewed this with the patient. ?  ?Patient states that his last radiation treatment was December 2. ?Patient had no other previous abdominal surgery aside from the previous hernia. ?  ?Review of Systems: ?A complete review of systems was obtained from the patient.  I have reviewed this information and discussed as appropriate with the patient.  See HPI as well for other ROS. ?  ?Review of Systems  ?Constitutional: Negative for fever.  ?HENT: Negative for congestion.   ?Eyes: Negative for blurred vision.  ?Respiratory: Negative for cough, shortness of breath and wheezing.   ?Cardiovascular: Negative for chest pain and palpitations.  ?Gastrointestinal: Negative for heartburn.  ?Genitourinary: Negative for dysuria.  ?Musculoskeletal: Negative for myalgias.  ?Skin: Negative for rash.  ?Neurological: Negative for dizziness and headaches.  ?Psychiatric/Behavioral: Negative for depression and suicidal ideas.  ?All other systems reviewed and are negative. ?  ?  ?  ?Medical History: ?Past Medical History ?Past Medical History: ?Diagnosis Date ? Anxiety   ? Arthritis   ? History of cancer    ? Hyperlipidemia   ? Hypertension   ? Sleep apnea   ? ?  ?  ?There is no problem list on file for this patient. ?  ?  ?Past Surgical History ?Past Surgical History: ?Procedure Laterality Date ? INGUINAL HERNIA REPAIR Left 03/09/2012 ?  Dr. Rockne Coons ? ?  ?  ?Allergies ?Allergies ?Allergen Reactions ? Escitalopram Diarrhea ? ?  ?  ?Current Outpatient Medications on File Prior to Visit ?Medication Sig Dispense Refill ? atorvastatin (LIPITOR) 40 MG tablet Take 1 tablet by mouth once daily     ? telmisartan (MICARDIS) 40 MG tablet Take 40 mg by mouth once daily     ? cholecalciferol (VITAMIN D3) 2,000 unit capsule Take 1 capsule by mouth once daily     ? sertraline (ZOLOFT) 50 MG tablet Take 50 mg by mouth once daily     ?  ?No current facility-administered medications on file prior to visit. ?  ?  ?Family History ?Family History ?Problem Relation Age of Onset ? High blood pressure (Hypertension) Sister   ? Hyperlipidemia (Elevated cholesterol) Sister   ? Coronary Artery Disease (Blocked arteries around heart) Sister   ? Breast cancer Sister   ? Coronary Artery Disease (Blocked arteries around heart) Brother   ? Diabetes Brother   ? Hyperlipidemia (Elevated cholesterol) Brother   ? High blood pressure (Hypertension) Brother   ? ?  ?  ?Social History ?  ?Tobacco Use ?Smoking Status Never ?Smokeless Tobacco Never ?  ?  ?  Social History ?Social History ?  ? ?Socioeconomic History ? Marital status: Married ?Tobacco Use ? Smoking status: Never ? Smokeless tobacco: Never ?Substance and Sexual Activity ? Alcohol use: Yes ?    Comment: 2 glasses of wine daily ? Drug use: Never ? ?  ?  ?Objective: ?  ?  ?Vitals: ?  08/29/21 1044 ?BP: (!) 158/84 ?Pulse: 92 ?Temp: 36.8 ?C (98.2 ?F) ?SpO2: 98% ?Weight: 98.2 kg (216 lb 6.4 oz) ?Height: 188 cm ('6\' 2"'$ ) ?  ?Body mass index is 27.78 kg/m?Marland Kitchen ?Physical Exam ?Constitutional:   ?   Appearance: Normal appearance.  ?HENT:  ?   Head: Normocephalic and atraumatic.  ?   Nose: Nose normal. No  congestion.  ?   Mouth/Throat:  ?   Mouth: Mucous membranes are moist.  ?   Pharynx: Oropharynx is clear.  ?Eyes:  ?   Pupils: Pupils are equal, round, and reactive to light.  ?Cardiovascular:  ?   Rate and Rhythm: Normal rate and regular rhythm.  ?   Pulses: Normal pulses.  ?   Heart sounds: Normal heart sounds. No murmur heard. ?  No friction rub. No gallop.  ?Pulmonary:  ?   Effort: Pulmonary effort is normal. No respiratory distress.  ?   Breath sounds: Normal breath sounds. No stridor. No wheezing, rhonchi or rales.  ?Abdominal:  ?   General: Abdomen is flat.  ?   Hernia: A hernia is present. Hernia is present in the left inguinal area (recurremt) and right inguinal area.  ?Musculoskeletal:     ?   General: Normal range of motion.  ?   Cervical back: Normal range of motion.  ?Skin: ?   General: Skin is warm and dry.  ?Neurological:  ?   General: No focal deficit present.  ?   Mental Status: He is alert and oriented to person, place, and time.  ?Psychiatric:     ?   Mood and Affect: Mood normal.     ?   Thought Content: Thought content normal.  ?  ?  ?  ?  ?Assessment and Plan: ?Diagnoses and all orders for this visit: ?  ?Unilateral recurrent inguinal hernia without obstruction or gangrene ?  ?Umbilical hernia without obstruction or gangrene ?  ?Recurrent left inguinal hernia ?  ?  ?Daniel Howe is a 81 y.o. male  ?  ?1.  We will proceed to the OR for a robotic bilateral inguinal hernia repair with mesh, possible open, and open umbilical hernia repair +/- mesh. ?2. All risks and benefits were discussed with the patient, to generally include infection, bleeding, damage to surrounding structures, acute and chronic nerve pain, and recurrence. Alternatives were offered and described.  All questions were answered and the patient voiced understanding of the procedure and wishes to proceed at this point. ?  ?  ?  ?  ?  ?  ?No follow-ups on file. ?  ?Ralene Ok, MD, FACS ?[facility], [place] ?General  & Minimally Invasive Surgery ? ?

## 2021-08-29 NOTE — H&P (View-Only) (Signed)
Chief Complaint: Inguinal Hernia       History of Present Illness: Daniel Howe is a 81 y.o. male who is seen today as an office consultation at the request of Dr. Milford Cage for evaluation of Inguinal Hernia .     Patient is a 81 year old male who comes in with a history of prostate cancer status post radiation.  Patient had previous left-sided inguinal hernia repair in 2014 by Dr. Hassell Done.  This was done via the open fashion.  Patient states that since December he has noticed that he had a bulge to the left inguinal area.  Patient also had a bulge in the right side.  He states he does have some pain and discomfort with straining.   Patient recently underwent a CT scan for his prostate cancer.  I did review the CT scan personally.  This does show bilateral fat-containing inguinal hernias as well as an umbilical hernia.   I reviewed this with the patient.   Patient states that his last radiation treatment was December 2. Patient had no other previous abdominal surgery aside from the previous hernia.   Review of Systems: A complete review of systems was obtained from the patient.  I have reviewed this information and discussed as appropriate with the patient.  See HPI as well for other ROS.   Review of Systems  Constitutional: Negative for fever.  HENT: Negative for congestion.   Eyes: Negative for blurred vision.  Respiratory: Negative for cough, shortness of breath and wheezing.   Cardiovascular: Negative for chest pain and palpitations.  Gastrointestinal: Negative for heartburn.  Genitourinary: Negative for dysuria.  Musculoskeletal: Negative for myalgias.  Skin: Negative for rash.  Neurological: Negative for dizziness and headaches.  Psychiatric/Behavioral: Negative for depression and suicidal ideas.  All other systems reviewed and are negative.       Medical History: Past Medical History Past Medical History: Diagnosis Date  Anxiety    Arthritis    History of cancer     Hyperlipidemia    Hypertension    Sleep apnea        There is no problem list on file for this patient.     Past Surgical History Past Surgical History: Procedure Laterality Date  INGUINAL HERNIA REPAIR Left 03/09/2012   Dr. Rockne Coons      Allergies Allergies Allergen Reactions  Escitalopram Diarrhea      Current Outpatient Medications on File Prior to Visit Medication Sig Dispense Refill  atorvastatin (LIPITOR) 40 MG tablet Take 1 tablet by mouth once daily      telmisartan (MICARDIS) 40 MG tablet Take 40 mg by mouth once daily      cholecalciferol (VITAMIN D3) 2,000 unit capsule Take 1 capsule by mouth once daily      sertraline (ZOLOFT) 50 MG tablet Take 50 mg by mouth once daily       No current facility-administered medications on file prior to visit.     Family History Family History Problem Relation Age of Onset  High blood pressure (Hypertension) Sister    Hyperlipidemia (Elevated cholesterol) Sister    Coronary Artery Disease (Blocked arteries around heart) Sister    Breast cancer Sister    Coronary Artery Disease (Blocked arteries around heart) Brother    Diabetes Brother    Hyperlipidemia (Elevated cholesterol) Brother    High blood pressure (Hypertension) Brother        Social History   Tobacco Use Smoking Status Never Smokeless Tobacco Never  Social History Social History    Socioeconomic History  Marital status: Married Tobacco Use  Smoking status: Never  Smokeless tobacco: Never Substance and Sexual Activity  Alcohol use: Yes     Comment: 2 glasses of wine daily  Drug use: Never      Objective:     Vitals:   08/29/21 1044 BP: (!) 158/84 Pulse: 92 Temp: 36.8 C (98.2 F) SpO2: 98% Weight: 98.2 kg (216 lb 6.4 oz) Height: 188 cm ('6\' 2"'$ )   Body mass index is 27.78 kg/m. Physical Exam Constitutional:      Appearance: Normal appearance.  HENT:     Head: Normocephalic and atraumatic.     Nose: Nose normal. No  congestion.     Mouth/Throat:     Mouth: Mucous membranes are moist.     Pharynx: Oropharynx is clear.  Eyes:     Pupils: Pupils are equal, round, and reactive to light.  Cardiovascular:     Rate and Rhythm: Normal rate and regular rhythm.     Pulses: Normal pulses.     Heart sounds: Normal heart sounds. No murmur heard.   No friction rub. No gallop.  Pulmonary:     Effort: Pulmonary effort is normal. No respiratory distress.     Breath sounds: Normal breath sounds. No stridor. No wheezing, rhonchi or rales.  Abdominal:     General: Abdomen is flat.     Hernia: A hernia is present. Hernia is present in the left inguinal area (recurremt) and right inguinal area.  Musculoskeletal:        General: Normal range of motion.     Cervical back: Normal range of motion.  Skin:    General: Skin is warm and dry.  Neurological:     General: No focal deficit present.     Mental Status: He is alert and oriented to person, place, and time.  Psychiatric:        Mood and Affect: Mood normal.        Thought Content: Thought content normal.          Assessment and Plan: Diagnoses and all orders for this visit:   Unilateral recurrent inguinal hernia without obstruction or gangrene   Umbilical hernia without obstruction or gangrene   Recurrent left inguinal hernia     Daniel Howe is a 81 y.o. male    1.  We will proceed to the OR for a robotic bilateral inguinal hernia repair with mesh, possible open, and open umbilical hernia repair +/- mesh. 2. All risks and benefits were discussed with the patient, to generally include infection, bleeding, damage to surrounding structures, acute and chronic nerve pain, and recurrence. Alternatives were offered and described.  All questions were answered and the patient voiced understanding of the procedure and wishes to proceed at this point.             No follow-ups on file.   Ralene Ok, MD, [facility], [place] General  & Minimally Invasive Surgery

## 2021-08-29 NOTE — Progress Notes (Addendum)
COVID Vaccine Completed:  Yes x2 ?Date COVID Vaccine completed: ?Has received booster:  Yes x3 ?COVID vaccine manufacturer: [employer]  ? ?Date of COVID positive in last 90 days: ? ?PCP -  Sueanne Margarita, MD (note on chart) ?Cardiologist - Virl Axe, MD ? ?Chest x-ray - N/A ?EKG - 01-09-21 Epic ?Stress Test - N/A ?ECHO - N/A ?Cardiac Cath - N/A ?Pacemaker/ICD device last checked: ?Spinal Cord Stimulator: ?Holter Monitor - 2020 Epic ? ?Bowel Prep - N/A ? ?Sleep Study - Yes, +sleep apnea ?CPAP - No, after weight loss ? ?Fasting Blood Sugar - N/A ?Checks Blood Sugar _____ times a day ? ?Blood Thinner Instructions:  N/A ?Aspirin Instructions: ?Last Dose: ? ?Activity level:  Can go up a flight of stairs and perform activities of daily living without stopping and without symptoms of chest pain or shortness of breath.  Able to exercise without symptoms ? ?Anesthesia review:  AV second degree block, HTN, OSA, renal sufficiency ? ?Patient denies shortness of breath, fever, cough and chest pain at PAT appointment ? ? ?Patient verbalized understanding of instructions that were given to them at the PAT appointment. Patient was also instructed that they will need to review over the PAT instructions again at home before surgery.  ?

## 2021-09-02 DIAGNOSIS — I2510 Atherosclerotic heart disease of native coronary artery without angina pectoris: Secondary | ICD-10-CM

## 2021-09-02 DIAGNOSIS — Z01818 Encounter for other preprocedural examination: Secondary | ICD-10-CM

## 2021-09-16 NOTE — Pre-Procedure Instructions (Signed)
Surgical Instructions ? ? ? Your procedure is scheduled on Tuesday, May 23rd. ? Report to [facility] Main Entrance "A" at 7:00 A.M., then check in with the Admitting office. ? Call this number if you have problems the morning of surgery: ? [phone] ? ? If you have any questions prior to your surgery date call [phone]: Open Monday-Friday 8am-4pm ? ? ? Remember: ? Do not eat after midnight the night before your surgery ? ?You may drink clear liquids until 6:00 a.m. the morning of your surgery.   ?Clear liquids allowed are: Water, Non-Citrus Juices (without pulp), Carbonated Beverages, Clear Tea, Black Coffee Only (NO MILK, CREAM OR POWDERED CREAMER of any kind), and Gatorade. ? ?Patient Instructions ? ?The day of surgery (if you do NOT have diabetes):  ?Drink ONE (1) Pre-Surgery Clear Ensure by 6:00 am the morning of surgery   ?This drink was given to you during your hospital  ?pre-op appointment visit. ?Nothing else to drink after completing the  ?Pre-Surgery Clear Ensure. ? ?       If you have questions, please contact your surgeon?s office. ? ?  ? Take these medicines the morning of surgery with A SIP OF WATER  ?atorvastatin (LIPITOR)  ?sertraline (ZOLOFT)  ? ?As of today, STOP taking any Aspirin (unless otherwise instructed by your surgeon) Aleve, Naproxen, Ibuprofen, Motrin, Advil, Goody's, BC's, all herbal medications, fish oil, and all vitamins. ?         ?           ?Do NOT Smoke (Tobacco/Vaping) for 24 hours prior to your procedure. ? ?If you use a CPAP at night, you may bring your mask/headgear for your overnight stay. ?  ?Contacts, glasses, piercing's, hearing aid's, dentures or partials may not be worn into surgery, please bring cases for these belongings.  ?  ?For patients admitted to the hospital, discharge time will be determined by your treatment team. ?  ?Patients discharged the day of surgery will not be allowed to drive home, and someone needs to stay with them for 24 hours. ? ?SURGICAL  WAITING ROOM VISITATION ?Patients having surgery or a procedure may have two support people in the waiting room. These visitors may be switched out with other visitors if needed. ?Children under the age of 74 must have an adult accompany them who is not the patient. ?If the patient needs to stay at the hospital during part of their recovery, the visitor guidelines for inpatient rooms apply. ? ?Please refer to the [facility] website for the visitor guidelines for Inpatients (after your surgery is over and you are in a regular room).  ? ? ?Special instructions:   ?Daniel Howe- Preparing For Surgery ? ?Before surgery, you can play an important role. Because skin is not sterile, your skin needs to be as free of germs as possible. You can reduce the number of germs on your skin by washing with CHG (chlorahexidine gluconate) Soap before surgery.  CHG is an antiseptic cleaner which kills germs and bonds with the skin to continue killing germs even after washing.   ? ?Oral Hygiene is also important to reduce your risk of infection.  Remember - BRUSH YOUR TEETH THE MORNING OF SURGERY WITH YOUR REGULAR TOOTHPASTE ? ?Please do not use if you have an allergy to CHG or antibacterial soaps. If your skin becomes reddened/irritated stop using the CHG.  ?Do not shave (including legs and underarms) for at least 48 hours prior to first CHG shower. It is  OK to shave your face. ? ?Please follow these instructions carefully. ?  ?Shower the NIGHT BEFORE SURGERY and the MORNING OF SURGERY ? ?If you chose to wash your hair, wash your hair first as usual with your normal shampoo. ? ?After you shampoo, rinse your hair and body thoroughly to remove the shampoo. ? ?Use CHG Soap as you would any other liquid soap. You can apply CHG directly to the skin and wash gently with a scrungie or a clean washcloth.  ? ?Apply the CHG Soap to your body ONLY FROM THE NECK DOWN.  Do not use on open wounds or open sores. Avoid contact with your eyes, ears,  mouth and genitals (private parts). Wash Face and genitals (private parts)  with your normal soap.  ? ?Wash thoroughly, paying special attention to the area where your surgery will be performed. ? ?Thoroughly rinse your body with warm water from the neck down. ? ?DO NOT shower/wash with your normal soap after using and rinsing off the CHG Soap. ? ?Pat yourself dry with a CLEAN TOWEL. ? ?Wear CLEAN PAJAMAS to bed the night before surgery ? ?Place CLEAN SHEETS on your bed the night before your surgery ? ?DO NOT SLEEP WITH PETS. ? ? ?Day of Surgery: ?Take a shower with CHG soap. ?Do not wear jewelry ?Do not wear lotions, powders, colognes, or deodorant. ?Men may shave face and neck. ?Do not bring valuables to the hospital.  ?[facility] is not responsible for any belongings or valuables. ?Wear Clean/Comfortable clothing the morning of surgery ?Remember to brush your teeth WITH YOUR REGULAR TOOTHPASTE. ?  ?Please read over the following fact sheets that you were given. ? ? ? ?If you received a COVID test during your pre-op visit  it is requested that you wear a mask when out in public, stay away from anyone that may not be feeling well and notify your surgeon if you develop symptoms. If you have been in contact with anyone that has tested positive in the last 10 days please notify you surgeon.; ?

## 2021-09-17 ENCOUNTER — Encounter (HOSPITAL_COMMUNITY)
Admission: RE | Admit: 2021-09-17 | Discharge: 2021-09-17 | Disposition: A | Discharge status: Home / Self Care | Payer: Medicare Other | Source: Ambulatory Visit | Attending: General Surgery | Admitting: General Surgery

## 2021-09-17 ENCOUNTER — Encounter (HOSPITAL_COMMUNITY): Payer: Self-pay

## 2021-09-17 ENCOUNTER — Other Ambulatory Visit: Payer: Self-pay

## 2021-09-17 VITALS — BP 158/73 | HR 53 | Temp 97.6°F | Resp 17 | Ht 74.0 in | Wt 217.0 lb

## 2021-09-17 DIAGNOSIS — K4091 Unilateral inguinal hernia, without obstruction or gangrene, recurrent: Secondary | ICD-10-CM | POA: Diagnosis not present

## 2021-09-17 DIAGNOSIS — K429 Umbilical hernia without obstruction or gangrene: Secondary | ICD-10-CM | POA: Insufficient documentation

## 2021-09-17 DIAGNOSIS — I10 Essential (primary) hypertension: Secondary | ICD-10-CM

## 2021-09-17 DIAGNOSIS — I459 Conduction disorder, unspecified: Secondary | ICD-10-CM | POA: Insufficient documentation

## 2021-09-17 DIAGNOSIS — Z8546 Personal history of malignant neoplasm of prostate: Secondary | ICD-10-CM | POA: Insufficient documentation

## 2021-09-17 DIAGNOSIS — Z01812 Encounter for preprocedural laboratory examination: Secondary | ICD-10-CM | POA: Insufficient documentation

## 2021-09-17 DIAGNOSIS — Z01818 Encounter for other preprocedural examination: Secondary | ICD-10-CM

## 2021-09-17 DIAGNOSIS — N189 Chronic kidney disease, unspecified: Secondary | ICD-10-CM | POA: Insufficient documentation

## 2021-09-17 DIAGNOSIS — G4733 Obstructive sleep apnea (adult) (pediatric): Secondary | ICD-10-CM | POA: Insufficient documentation

## 2021-09-17 DIAGNOSIS — I129 Hypertensive chronic kidney disease with stage 1 through stage 4 chronic kidney disease, or unspecified chronic kidney disease: Secondary | ICD-10-CM | POA: Diagnosis not present

## 2021-09-17 HISTORY — DX: Cardiac arrhythmia, unspecified: I49.9

## 2021-09-17 LAB — CBC
HCT: 40.3 % (ref 39.0–52.0)
Hemoglobin: 14.1 g/dL (ref 13.0–17.0)
MCH: 34.1 pg — ABNORMAL HIGH (ref 26.0–34.0)
MCHC: 35 g/dL (ref 30.0–36.0)
MCV: 97.6 fL (ref 80.0–100.0)
Platelets: 176 10*3/uL (ref 150–400)
RBC: 4.13 MIL/uL — ABNORMAL LOW (ref 4.22–5.81)
RDW: 12.5 % (ref 11.5–15.5)
WBC: 5.6 10*3/uL (ref 4.0–10.5)
nRBC: 0 % (ref 0.0–0.2)

## 2021-09-17 LAB — BASIC METABOLIC PANEL
Anion gap: 5 (ref 5–15)
BUN: 29 mg/dL — ABNORMAL HIGH (ref 8–23)
CO2: 23 mmol/L (ref 22–32)
Calcium: 8.8 mg/dL — ABNORMAL LOW (ref 8.9–10.3)
Chloride: 108 mmol/L (ref 98–111)
Creatinine, Ser: 1.52 mg/dL — ABNORMAL HIGH (ref 0.61–1.24)
GFR, Estimated: 47 mL/min — ABNORMAL LOW (ref >60)
Glucose, Bld: 112 mg/dL — ABNORMAL HIGH (ref 70–99)
Potassium: 4.1 mmol/L (ref 3.5–5.1)
Sodium: 136 mmol/L (ref 135–145)

## 2021-09-17 NOTE — Progress Notes (Signed)
PCP - Dr. Sueanne Margarita ?Cardiologist - Dr. Virl Axe ? ?PPM/ICD - denies ? ? ?Chest x-ray - 10/27/13 ?EKG - 01/09/21 ?Stress Test - denies ?ECHO - denies ?Cardiac Cath - denies ? ?Sleep Study - sleep study around 5 years ago per pt, OSA+ ?CPAP - pt states he no longer needs to use a CPAP after weight loss ? ?DM- denies ? ?ASA/Blood Thinner Instructions: n/a ? ? ?ERAS Protcol - yes ?PRE-SURGERY Ensure given at PAT ? ?COVID TEST- n/a ? ? ?Anesthesia review: no ? ?Patient denies shortness of breath, fever, cough and chest pain at PAT appointment ? ? ?All instructions explained to the patient, with a verbal understanding of the material. Patient agrees to go over the instructions while at home for a better understanding. Patient also instructed to notify surgeon of any contact with COVID+ person or if he develops any symptoms. The opportunity to ask questions was provided. ?  ?

## 2021-09-18 NOTE — Progress Notes (Signed)
Anesthesia Chart Review:  Case: [id number] Date/Time: 09/23/21 0845   Procedures:      HERNIA REPAIR UMBILICAL ADULT, with mesh (Bilateral)     XI ROBOTIC ASSISTED BILATERAL INGUINAL HERNIA with mesh, open possible - robotic bilateral (Bilateral)   Anesthesia type: General   Pre-op diagnosis: RECURRENT LEFT INGUINAL HERNIA, UMBILICAL HERNIA   Location: [facility] OR ROOM 10 / [facility]   Surgeons: Daniel Ok, MD       DISCUSSION: Patient is a 81 year old male scheduled for the above procedure.  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   He follows with EP Dr. Caryl Howe since 08/2018 after 48 hour Holter monitor showed a 1st to 3rd degree AV block with "midday 3+ second pause associated with multiple nonconducted P waves.  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  The patient had no symptoms of lightheadedness.  He has had no syncope/presyncope." He had known previously significant 1st degree AV block with PR interval of 400 ms. He wrote, "No symptoms of lightheadedness to suggest daytime high-grade heart block and suspect the event noted occurred during sleep as consistent with hyper vagotonia."  In 04/2019, he was weaned from his b-blocker. At his last visit on 01/09/21 (note Daniel Howe is listed as Daniel Howe, but is signed by Dr. Caryl Howe), he continued to have no symptoms of heart block. Continue monitoring with 12 month follow-up recommended.   At PAT, he denied any new cardiac symptoms such as dizziness, syncope, chest pain, SOB since he saw Dr. Caryl Howe with continued monitoring recommended. Anesthesia team to evaluate on the day of surgery and determine definitve anesthesia plan/monitoring.      VS: BP (!) 158/73   Pulse (!) 53    Temp 36.4 C   Resp 17   Ht '6\' 2"'$  (1.88 m)   Wt 98.4 kg   SpO2 100%   BMI 27.86 kg/m    PROVIDERS: Daniel Margarita, DO is PCP Daniel Axe, MD is EP cardiologist   LABS: Labs reviewed: Acceptable for surgery. Cr 1.52, consistent with previous results.  (all labs ordered are listed, but only abnormal results are displayed)  Labs Reviewed  BASIC METABOLIC PANEL - Abnormal; Notable for the following components:      Result Value   Glucose, Bld 112 (*)    BUN 29 (*)    Creatinine, Ser 1.52 (*)    Calcium 8.8 (*)    GFR, Estimated 47 (*)    All other components within normal limits  CBC - Abnormal; Notable for the following components:   RBC 4.13 (*)    MCH 34.1 (*)    All other components within normal limits     IMAGES: CT abd/pelvis 12/02/20: Impression: No evidence of abdominal or pelvic metastatic disease. Bilateral inguinal hernias, both containing only fat. Small periumbilical ventral hernia, which contains only fat. Colonic diverticulosis.  No radiographic evidence of diverticulitis.   Aortic atherosclerosis.   EKG: 01/09/21 ([facility]): SR at 78 bpm. First degree AV Block, PR 416 ms.   CV: Holter monitor 07/07/18 (recording time: 1 d 23 h 77 m): Basic underlying rhythm is sinus rhythm and sinus bradycardia. Occasional junctional rhythm is noted. First-degree, second-degree, and third-degree AV block noted. Third-degree AV block with pauses of 3.1 seconds each occurred twice.  No reported symptoms. No atrial fibrillation. Cannot exclude chronotropic incompetence  Abnormal study    Past Medical History:  Diagnosis Date   Anxiety    Arthritis    Asthma    In childhood   Chronic kidney disease    Renal insufficiency   Depression    Dysrhythmia    hx of bradycardia, pauses shown on holter monitor from 2021   Hx of radiation therapy    Hypertension    Pneumonia    Several times as a child   Prostate cancer [name]    Sleep apnea     Past Surgical  History:  Procedure Laterality Date   CATARACT EXTRACTION     CORNEAL TRANSPLANT     HERNIA REPAIR  03/09/12   lih repair   INGUINAL HERNIA REPAIR  03/09/2012   Procedure: HERNIA REPAIR INGUINAL ADULT;  Surgeon: Daniel Earls, MD;  Location: [facility];  Service: General;  Laterality: Left;    MEDICATIONS:  atorvastatin (LIPITOR) 40 MG tablet   Cholecalciferol (VITAMIN D) 50 MCG (2000 UT) CAPS   melatonin 5 MG TABS   sertraline (ZOLOFT) 50 MG tablet   telmisartan (MICARDIS) 40 MG tablet   No current facility-administered medications for this encounter.    Daniel Gianotti, PA-C Surgical Short Stay/Anesthesiology [facility] Phone [phone] [facility] Phone [phone] 09/18/2021 1:02 PM

## 2021-09-18 NOTE — Anesthesia Preprocedure Evaluation (Addendum)
Anesthesia Evaluation  Patient identified by MRN, date of birth, ID band Patient awake    Reviewed: Allergy & Precautions, NPO status , Patient's Chart, lab work & pertinent test results  Airway Mallampati: III  TM Distance: >3 FB Neck ROM: Full    Dental  (+) Teeth Intact, Dental Advisory Given   Pulmonary asthma (childhood) , sleep apnea (does not use CPAP in a few years) ,    Pulmonary exam normal breath sounds clear to auscultation       Cardiovascular hypertension (148/70 in preop), Pt. 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 Caryl Comes.   Neuro/Psych PSYCHIATRIC DISORDERS Anxiety Depression negative neurological ROS     GI/Hepatic negative GI ROS, (+)       alcohol use,   Endo/Other  negative endocrine ROShct 40.3  Renal/GU Renal InsufficiencyRenal diseaseCr 1.52  negative genitourinary   Musculoskeletal  (+) Arthritis , Osteoarthritis,    Abdominal   Peds negative pediatric ROS (+)  Hematology negative hematology ROS (+)   Anesthesia Other Findings   Reproductive/Obstetrics negative OB ROS                           Anesthesia Physical Anesthesia Plan  ASA: 2  Anesthesia Plan: General   Post-op Pain Management: Tylenol PO (pre-op)*   Induction: Intravenous  PONV Risk Score and Plan: 3 and Ondansetron, Dexamethasone and Treatment may vary due to age or medical condition  Airway Management Planned: Oral ETT  Additional Equipment: None  Intra-op Plan:   Post-operative Plan: Extubation in OR  Informed Consent: I have reviewed the patients History and Physical, chart, labs and discussed the procedure including the risks, benefits and alternatives for the proposed anesthesia with the patient or authorized representative who has indicated his/her understanding  and acceptance.     Dental advisory given  Plan Discussed with: CRNA  Anesthesia Plan Comments:       Anesthesia Quick Evaluation

## 2021-09-23 ENCOUNTER — Ambulatory Visit (HOSPITAL_BASED_OUTPATIENT_CLINIC_OR_DEPARTMENT_OTHER): Payer: Medicare Other | Admitting: Certified Registered Nurse Anesthetist

## 2021-09-23 ENCOUNTER — Encounter (HOSPITAL_COMMUNITY)
Admission: RE | Disposition: A | Discharge status: Home / Self Care | Payer: Self-pay | Source: Home / Self Care | Attending: General Surgery

## 2021-09-23 ENCOUNTER — Encounter (HOSPITAL_COMMUNITY): Payer: Self-pay | Admitting: General Surgery

## 2021-09-23 ENCOUNTER — Other Ambulatory Visit: Payer: Self-pay

## 2021-09-23 ENCOUNTER — Ambulatory Visit (HOSPITAL_COMMUNITY)
Admission: RE | Admit: 2021-09-23 | Discharge: 2021-09-23 | Disposition: A | Discharge status: Home / Self Care | Payer: Medicare Other | Attending: General Surgery | Admitting: General Surgery

## 2021-09-23 ENCOUNTER — Ambulatory Visit (HOSPITAL_COMMUNITY): Payer: Medicare Other | Admitting: Certified Registered Nurse Anesthetist

## 2021-09-23 DIAGNOSIS — I2510 Atherosclerotic heart disease of native coronary artery without angina pectoris: Secondary | ICD-10-CM

## 2021-09-23 DIAGNOSIS — K429 Umbilical hernia without obstruction or gangrene: Secondary | ICD-10-CM | POA: Diagnosis not present

## 2021-09-23 DIAGNOSIS — K4021 Bilateral inguinal hernia, without obstruction or gangrene, recurrent: Secondary | ICD-10-CM

## 2021-09-23 DIAGNOSIS — Z8546 Personal history of malignant neoplasm of prostate: Secondary | ICD-10-CM | POA: Insufficient documentation

## 2021-09-23 DIAGNOSIS — Z01818 Encounter for other preprocedural examination: Secondary | ICD-10-CM

## 2021-09-23 DIAGNOSIS — Z923 Personal history of irradiation: Secondary | ICD-10-CM | POA: Insufficient documentation

## 2021-09-23 HISTORY — DX: Unspecified osteoarthritis, unspecified site: M19.90

## 2021-09-23 HISTORY — PX: UMBILICAL HERNIA REPAIR: SHX196

## 2021-09-23 HISTORY — DX: Chronic kidney disease, unspecified: N18.9

## 2021-09-23 HISTORY — DX: Pneumonia, unspecified organism: J18.9

## 2021-09-23 HISTORY — PX: INSERTION OF MESH: SHX5868

## 2021-09-23 HISTORY — DX: Personal history of irradiation: Z92.3

## 2021-09-23 HISTORY — DX: Sleep apnea, unspecified: G47.30

## 2021-09-23 HISTORY — DX: Unspecified asthma, uncomplicated: J45.909

## 2021-09-23 HISTORY — DX: Malignant neoplasm of prostate: C61

## 2021-09-23 SURGERY — REPAIR, HERNIA, UMBILICAL, ADULT
Anesthesia: General | Site: Abdomen | Laterality: Bilateral

## 2021-09-23 MED ORDER — DEXAMETHASONE SODIUM PHOSPHATE 10 MG/ML IJ SOLN
INTRAMUSCULAR | Status: AC
  Filled 2021-09-23: qty 1

## 2021-09-23 MED ORDER — ACETAMINOPHEN 500 MG PO TABS
1000.0000 mg | ORAL_TABLET | ORAL | Status: AC
  Administered 2021-09-23: 1000 mg via ORAL
  Filled 2021-09-23: qty 2

## 2021-09-23 MED ORDER — BUPIVACAINE HCL (PF) 0.25 % IJ SOLN
INTRAMUSCULAR | Status: AC
  Filled 2021-09-23: qty 30

## 2021-09-23 MED ORDER — GLYCOPYRROLATE PF 0.2 MG/ML IJ SOSY
PREFILLED_SYRINGE | INTRAMUSCULAR | Status: AC
  Filled 2021-09-23: qty 1

## 2021-09-23 MED ORDER — CHLORHEXIDINE GLUCONATE CLOTH 2 % EX PADS: 6.0000 | MEDICATED_PAD | Freq: Once | CUTANEOUS | Status: DC

## 2021-09-23 MED ORDER — EPHEDRINE SULFATE-NACL 50-0.9 MG/10ML-% IV SOSY
PREFILLED_SYRINGE | INTRAVENOUS | Status: DC | PRN
Start: 2021-09-23 — End: 2021-09-23
  Administered 2021-09-23 (×3): 5 mg via INTRAVENOUS

## 2021-09-23 MED ORDER — PROPOFOL 10 MG/ML IV BOLUS
INTRAVENOUS | Status: AC
  Filled 2021-09-23: qty 20

## 2021-09-23 MED ORDER — STERILE WATER FOR IRRIGATION IR SOLN
Status: DC | PRN
  Administered 2021-09-23: 300 mL

## 2021-09-23 MED ORDER — OXYCODONE HCL 5 MG PO TABS: 5.0000 mg | ORAL_TABLET | Freq: Once | ORAL | Status: DC | PRN

## 2021-09-23 MED ORDER — FENTANYL CITRATE (PF) 250 MCG/5ML IJ SOLN
INTRAMUSCULAR | Status: AC
  Filled 2021-09-23: qty 5

## 2021-09-23 MED ORDER — FENTANYL CITRATE (PF) 250 MCG/5ML IJ SOLN
INTRAMUSCULAR | Status: DC | PRN
  Administered 2021-09-23 (×3): 50 ug via INTRAVENOUS
  Administered 2021-09-23: 100 ug via INTRAVENOUS
  Administered 2021-09-23: 50 ug via INTRAVENOUS

## 2021-09-23 MED ORDER — ENSURE PRE-SURGERY PO LIQD: 296.0000 mL | Freq: Once | ORAL | Status: DC

## 2021-09-23 MED ORDER — LIDOCAINE 2% (20 MG/ML) 5 ML SYRINGE
INTRAMUSCULAR | Status: DC | PRN
  Administered 2021-09-23: 60 mg via INTRAVENOUS

## 2021-09-23 MED ORDER — ROCURONIUM BROMIDE 10 MG/ML (PF) SYRINGE
PREFILLED_SYRINGE | INTRAVENOUS | Status: AC
  Filled 2021-09-23: qty 20

## 2021-09-23 MED ORDER — SUGAMMADEX SODIUM 200 MG/2ML IV SOLN
INTRAVENOUS | Status: DC | PRN
  Administered 2021-09-23: 200 mg via INTRAVENOUS

## 2021-09-23 MED ORDER — ORAL CARE MOUTH RINSE: 15.0000 mL | Freq: Once | OROMUCOSAL | Status: AC

## 2021-09-23 MED ORDER — TRAMADOL HCL 50 MG PO TABS: 50.0000 mg | ORAL_TABLET | Freq: Four times a day (QID) | ORAL | 0 refills | Status: AC | PRN

## 2021-09-23 MED ORDER — EPHEDRINE 5 MG/ML INJ
INTRAVENOUS | Status: AC
  Filled 2021-09-23: qty 5

## 2021-09-23 MED ORDER — AMISULPRIDE (ANTIEMETIC) 5 MG/2ML IV SOLN: 10.0000 mg | Freq: Once | INTRAVENOUS | Status: DC | PRN

## 2021-09-23 MED ORDER — LIDOCAINE 2% (20 MG/ML) 5 ML SYRINGE
INTRAMUSCULAR | Status: AC
  Filled 2021-09-23: qty 5

## 2021-09-23 MED ORDER — ONDANSETRON HCL 4 MG/2ML IJ SOLN
INTRAMUSCULAR | Status: AC
  Filled 2021-09-23: qty 2

## 2021-09-23 MED ORDER — ONDANSETRON HCL 4 MG/2ML IJ SOLN: 4.0000 mg | Freq: Once | INTRAMUSCULAR | Status: DC | PRN

## 2021-09-23 MED ORDER — BUPIVACAINE LIPOSOME 1.3 % IJ SUSP
INTRAMUSCULAR | Status: AC
  Filled 2021-09-23: qty 20

## 2021-09-23 MED ORDER — CEFAZOLIN SODIUM-DEXTROSE 2-4 GM/100ML-% IV SOLN
2.0000 g | INTRAVENOUS | Status: AC
  Administered 2021-09-23: 2 g via INTRAVENOUS
  Filled 2021-09-23: qty 100

## 2021-09-23 MED ORDER — GLYCOPYRROLATE 0.2 MG/ML IJ SOLN
INTRAMUSCULAR | Status: DC | PRN
  Administered 2021-09-23 (×2): .2 mg via INTRAVENOUS

## 2021-09-23 MED ORDER — PROPOFOL 10 MG/ML IV BOLUS
INTRAVENOUS | Status: DC | PRN
  Administered 2021-09-23: 100 mg via INTRAVENOUS

## 2021-09-23 MED ORDER — ROCURONIUM BROMIDE 10 MG/ML (PF) SYRINGE
PREFILLED_SYRINGE | INTRAVENOUS | Status: DC | PRN
  Administered 2021-09-23: 80 mg via INTRAVENOUS
  Administered 2021-09-23: 20 mg via INTRAVENOUS
  Administered 2021-09-23: 30 mg via INTRAVENOUS

## 2021-09-23 MED ORDER — CHLORHEXIDINE GLUCONATE 0.12 % MT SOLN
15.0000 mL | Freq: Once | OROMUCOSAL | Status: AC
  Administered 2021-09-23: 15 mL via OROMUCOSAL
  Filled 2021-09-23: qty 15

## 2021-09-23 MED ORDER — SODIUM CHLORIDE (PF) 0.9 % IJ SOLN
INTRAMUSCULAR | Status: DC | PRN
  Administered 2021-09-23: 40 mL

## 2021-09-23 MED ORDER — DEXAMETHASONE SODIUM PHOSPHATE 10 MG/ML IJ SOLN
INTRAMUSCULAR | Status: DC | PRN
  Administered 2021-09-23: 8 mg via INTRAVENOUS

## 2021-09-23 MED ORDER — LACTATED RINGERS IV SOLN: INTRAVENOUS | Status: DC

## 2021-09-23 MED ORDER — ONDANSETRON HCL 4 MG/2ML IJ SOLN
INTRAMUSCULAR | Status: DC | PRN
Start: 2021-09-23 — End: 2021-09-23
  Administered 2021-09-23: 4 mg via INTRAVENOUS

## 2021-09-23 MED ORDER — HYDROMORPHONE HCL 1 MG/ML IJ SOLN: 0.2500 mg | INTRAMUSCULAR | Status: DC | PRN

## 2021-09-23 MED ORDER — OXYCODONE HCL 5 MG/5ML PO SOLN: 5.0000 mg | Freq: Once | ORAL | Status: DC | PRN

## 2021-09-23 MED ORDER — BUPIVACAINE HCL 0.25 % IJ SOLN
INTRAMUSCULAR | Status: DC | PRN
  Administered 2021-09-23: 9 mL

## 2021-09-23 MED ORDER — 0.9 % SODIUM CHLORIDE (POUR BTL) OPTIME
TOPICAL | Status: DC | PRN
  Administered 2021-09-23: 1000 mL

## 2021-09-23 SURGICAL SUPPLY — 67 items
BAG COUNTER SPONGE SURGICOUNT (BAG) ×3 IMPLANT
BLADE CLIPPER SURG (BLADE) IMPLANT
CANISTER SUCT 3000ML PPV (MISCELLANEOUS) ×3 IMPLANT
CHLORAPREP W/TINT 26 (MISCELLANEOUS) ×3 IMPLANT
COVER MAYO STAND STRL (DRAPES) ×3 IMPLANT
COVER SURGICAL LIGHT HANDLE (MISCELLANEOUS) ×3 IMPLANT
COVER TIP SHEARS 8 DVNC (MISCELLANEOUS) ×2 IMPLANT
COVER TIP SHEARS 8MM DA VINCI (MISCELLANEOUS) ×1
DECANTER SPIKE VIAL GLASS SM (MISCELLANEOUS) ×3 IMPLANT
DEFOGGER SCOPE WARMER CLEARIFY (MISCELLANEOUS) ×3 IMPLANT
DERMABOND ADVANCED (GAUZE/BANDAGES/DRESSINGS) ×1
DERMABOND ADVANCED .7 DNX12 (GAUZE/BANDAGES/DRESSINGS) ×2 IMPLANT
DEVICE TROCAR PUNCTURE CLOSURE (ENDOMECHANICALS) ×3 IMPLANT
DRAPE ARM DVNC X/XI (DISPOSABLE) ×8 IMPLANT
DRAPE COLUMN DVNC XI (DISPOSABLE) ×2 IMPLANT
DRAPE CV SPLIT W-CLR ANES SCRN (DRAPES) ×3 IMPLANT
DRAPE DA VINCI XI ARM (DISPOSABLE) ×4
DRAPE DA VINCI XI COLUMN (DISPOSABLE) ×1
DRAPE ORTHO SPLIT 77X108 STRL (DRAPES) ×1
DRAPE SURG ORHT 6 SPLT 77X108 (DRAPES) ×2 IMPLANT
ELECT CAUTERY BLADE 6.4 (BLADE) ×1 IMPLANT
ELECT REM PT RETURN 9FT ADLT (ELECTROSURGICAL) ×3
ELECTRODE REM PT RTRN 9FT ADLT (ELECTROSURGICAL) ×2 IMPLANT
GAUZE 4X4 16PLY ~~LOC~~+RFID DBL (SPONGE) ×3 IMPLANT
GLOVE BIO SURGEON STRL SZ7.5 (GLOVE) ×6 IMPLANT
GLOVE BIOGEL PI IND STRL 8 (GLOVE) ×2 IMPLANT
GLOVE BIOGEL PI INDICATOR 8 (GLOVE) ×1
GLOVE SURG SYN 7.5  E (GLOVE) ×3
GLOVE SURG SYN 7.5 E (GLOVE) ×6 IMPLANT
GLOVE SURG SYN 7.5 PF PI (GLOVE) ×6 IMPLANT
GOWN STRL REUS W/ TWL LRG LVL3 (GOWN DISPOSABLE) ×4 IMPLANT
GOWN STRL REUS W/ TWL XL LVL3 (GOWN DISPOSABLE) ×4 IMPLANT
GOWN STRL REUS W/TWL 2XL LVL3 (GOWN DISPOSABLE) ×3 IMPLANT
GOWN STRL REUS W/TWL LRG LVL3 (GOWN DISPOSABLE) ×2
GOWN STRL REUS W/TWL XL LVL3 (GOWN DISPOSABLE) ×2
KIT BASIN OR (CUSTOM PROCEDURE TRAY) ×3 IMPLANT
KIT TURNOVER KIT B (KITS) ×3 IMPLANT
MARKER SKIN DUAL TIP RULER LAB (MISCELLANEOUS) ×3 IMPLANT
MESH PROGRIP LAP SELF FIXATING (Mesh General) ×2 IMPLANT
MESH PROGRIP LAP SLF FIX 16X12 (Mesh General) ×2 IMPLANT
MESH VENTRALEX ST 2.5 CRC MED (Mesh General) ×1 IMPLANT
NDL INSUFFLATION 14GA 120MM (NEEDLE) ×2 IMPLANT
NEEDLE 22X1 1/2 (OR ONLY) (NEEDLE) ×1 IMPLANT
NEEDLE HYPO 22GX1.5 SAFETY (NEEDLE) ×3 IMPLANT
NEEDLE INSUFFLATION 14GA 120MM (NEEDLE) ×3 IMPLANT
NS IRRIG 1000ML POUR BTL (IV SOLUTION) ×3 IMPLANT
PAD ARMBOARD 7.5X6 YLW CONV (MISCELLANEOUS) ×6 IMPLANT
PENCIL SMOKE EVACUATOR (MISCELLANEOUS) ×3 IMPLANT
SEAL CANN UNIV 5-8 DVNC XI (MISCELLANEOUS) ×4 IMPLANT
SEAL XI 5MM-8MM UNIVERSAL (MISCELLANEOUS) ×3
SET TUBE SMOKE EVAC HIGH FLOW (TUBING) ×3 IMPLANT
STOPCOCK 4 WAY LG BORE MALE ST (IV SETS) ×3 IMPLANT
SUT ETHIBOND 0 MO6 C/R (SUTURE) ×1 IMPLANT
SUT MNCRL AB 4-0 PS2 18 (SUTURE) ×3 IMPLANT
SUT PROLENE 0 CT 1 30 (SUTURE) ×4 IMPLANT
SUT PROLENE 2 0 SH DA (SUTURE) ×1 IMPLANT
SUT VIC AB 2-0 CT1 27 (SUTURE)
SUT VIC AB 2-0 CT1 TAPERPNT 27 (SUTURE) ×2 IMPLANT
SUT VIC AB 3-0 SH 27 (SUTURE) ×2
SUT VIC AB 3-0 SH 27X BRD (SUTURE) IMPLANT
SUT VIC AB 3-0 SH 27XBRD (SUTURE) ×2 IMPLANT
SUT VLOC 180 2-0 6IN GS21 (SUTURE) ×4 IMPLANT
SUT VLOC 180 2-0 9IN GS21 (SUTURE) ×3 IMPLANT
SYR 30ML SLIP (SYRINGE) ×3 IMPLANT
TOWEL GREEN STERILE (TOWEL DISPOSABLE) ×3 IMPLANT
TOWEL GREEN STERILE FF (TOWEL DISPOSABLE) ×3 IMPLANT
TRAY LAPAROSCOPIC MC (CUSTOM PROCEDURE TRAY) ×3 IMPLANT

## 2021-09-23 NOTE — Progress Notes (Addendum)
Patient here for out patient hernia surgery. Post -op abnormal EKG we were asked to look at and comment on In d/w anesthesiologist, inter-op had 2 events of bradycardia to 30's that required treatment   Reviewed today's EKG as well as last few going back to 2021 with Dr. Curt Bears, discussed observed/treated brady inter-op. HR/rhythm, block look similar to historical EKGs and QRS morphology is unchanged. In absence of symptoms and with stable HR/vitals post OP, would not need to stay based on this EKG  EP follow up will be arranged for him.  Tommye Standard, PA-C

## 2021-09-23 NOTE — Anesthesia Postprocedure Evaluation (Signed)
Anesthesia Post Note  Patient: Daniel Howe  Procedure(s) Performed: OPEN UMBILICAL HERNIA REPAIR WITH MESH (Abdomen) XI ROBOTIC ASSISTED BILATERAL INGUINAL HERNIA (Bilateral: Abdomen) INSERTION OF MESH (Bilateral: Abdomen)     Patient location during evaluation: PACU Anesthesia Type: General Level of consciousness: awake and alert Pain management: pain level controlled Vital Signs Assessment: post-procedure vital signs reviewed and stable Respiratory status: spontaneous breathing, nonlabored ventilation, respiratory function stable and patient connected to nasal cannula oxygen Cardiovascular status: blood pressure returned to baseline and stable Postop Assessment: no apparent nausea or vomiting Anesthetic complications: no Comments: Cardiology consulted for evaluation of persistent 2nd degree heart block.  They deemed patient was ok to D/C home now and follow up with a cardiology appointment in the near future.   No notable events documented.  Last Vitals:  Vitals:   09/23/21 1245 09/23/21 1300  BP: (!) 148/63 124/72  Pulse: 60 69  Resp: (!) 21 17  Temp:  36.5 C  SpO2: 99% 97%    Last Pain:  Vitals:   09/23/21 1300  TempSrc:   PainSc: 0-No pain                 Eh Sesay S

## 2021-09-23 NOTE — Discharge Instructions (Signed)
[facility] _______[facility] ? ?INGUINAL HERNIA REPAIR: POST OP INSTRUCTIONS ? ?Always review your discharge instruction sheet given to you by the facility where your surgery was performed. ?IF YOU HAVE DISABILITY OR FAMILY LEAVE FORMS, YOU MUST BRING THEM TO THE OFFICE FOR PROCESSING.   ?DO NOT GIVE THEM TO YOUR DOCTOR. ? ?1. A  prescription for pain medication may be given to you upon discharge.  Take your pain medication as prescribed, if needed.  If narcotic pain medicine is not needed, then you may take acetaminophen (Tylenol) or ibuprofen (Advil) as needed. ?2. Take your usually prescribed medications unless otherwise directed. ?If you need a refill on your pain medication, please contact your pharmacy.  They will contact our office to request authorization. Prescriptions will not be filled after 5 pm or on week-ends. ?3. You should follow a light diet the first 24 hours after arrival home, such as soup and crackers, etc.  Be sure to include lots of fluids daily.  Resume your normal diet the day after surgery. ?4.Most patients will experience some swelling and bruising around the umbilicus or in the groin and scrotum.  Ice packs and reclining will help.  Swelling and bruising can take several days to resolve.  ?6. It is common to experience some constipation if taking pain medication after surgery.  Increasing fluid intake and taking a stool softener (such as Colace) will usually help or prevent this problem from occurring.  A mild laxative (Milk of Magnesia or Miralax) should be taken according to package directions if there are no bowel movements after 48 hours. ?7. Unless discharge instructions indicate otherwise, you may remove your bandages 24-48 hours after surgery, and you may shower at that time.  You may have steri-strips (small skin tapes) in place directly over the incision.  These strips should be left on the skin for 7-10 days.  If your surgeon used skin glue on the incision, you may  shower in 24 hours.  The glue will flake off over the next 2-3 weeks.  Any sutures or staples will be removed at the office during your follow-up visit. ?8. ACTIVITIES:  You may resume regular (light) daily activities beginning the next day--such as daily self-care, walking, climbing stairs--gradually increasing activities as tolerated.  You may have sexual intercourse when it is comfortable.  Refrain from any heavy lifting or straining until approved by your doctor. ? ?a.You may drive when you are no longer taking prescription pain medication, you can comfortably wear a seatbelt, and you can safely maneuver your car and apply brakes. ?b.RETURN TO WORK:   ?_____________________________________________ ? ?9.You should see your doctor in the office for a follow-up appointment approximately 2-3 weeks after your surgery.  Make sure that you call for this appointment within a day or two after you arrive home to insure a convenient appointment time. ?10.OTHER INSTRUCTIONS: _________________________ ?   _____________________________________ ? ?WHEN TO CALL YOUR DOCTOR: ?Fever over 101.0 ?Inability to urinate ?Nausea and/or vomiting ?Extreme swelling or bruising ?Continued bleeding from incision. ?Increased pain, redness, or drainage from the incision ? ?The clinic staff is available to answer your questions during regular business hours.  Please don?t hesitate to call and ask to speak to one of the nurses for clinical concerns.  If you have a medical emergency, go to the nearest emergency room or call 911.  A surgeon from [facility] is always on call at the hospital ? ? ?[address], [address], [place], [place]    [address] ? ? [address] [address], [place], [place]   [address] ?[phone] ? [phone] ? FAX [phone] ?Web site: [email or web address] ? ?

## 2021-09-23 NOTE — Op Note (Signed)
09/23/2021  10:42 AM  PATIENT:  Daniel Howe  81 y.o. male  PRE-OPERATIVE DIAGNOSIS:  RECURRENT LEFT INGUINAL HERNIA, RIGHT INGUINAL HERNIA, UMBILICAL HERNIA  POST-OPERATIVE DIAGNOSIS:  RECURRENT LEFT INGUINAL HERNIA-INDIRECT, SLIDING & RIGHT INDIRECT INGUINAL HERNIA, 3CM UMBILICAL HERNIA  PROCEDURE:  Procedure(s): OPEN UMBILICAL HERNIA REPAIR WITH MESH XI ROBOTIC ASSISTED BILATERAL INGUINAL HERNIA (Bilateral) INSERTION OF MESH (Bilateral)  SURGEON:  Surgeon(s) and Role:    Ralene Ok, MD - Primary  ASSISTANTS: Pryor Curia, RNFA   ANESTHESIA:   local, regional, and general  EBL:  10 mL   BLOOD ADMINISTERED:none  DRAINS: none   LOCAL MEDICATIONS USED:  BUPIVICAINE  and OTHER exparel  SPECIMEN:  No Specimen  DISPOSITION OF SPECIMEN:  PATHOLOGY  COUNTS:  YES  TOURNIQUET:  * No tourniquets in log *  DICTATION: .Dragon Dictation  The patient was taken back to the operating room. The patient was placed in supine position with bilateral SCDs in place. The patient was prepped and draped in the usual sterile fashion.  After appropriate anitbiotics were confirmed, a time-out was confirmed and all facts were verified.  At this time a Veress needle technique was used to inspect the abdomen approximately 10 cm from the umbilicus in the  Midclavicular line. This time a 12 mm robotic trocar was placed into the abdomen. The camera was placed there was no injury to any intra-abdominal organs. A 75m umbilical port was placed just superior to the umbilicus. An 8 mm port was placed approximately 10 cm lateral to the umbilicus on the left paramedian side.  Robot was positioned over the patient and the ports were docked in the usual fashion.  Upon visualizing the left inguinal hernia it was apparent there was a sliding component that contained sigmoid colon. At this time the left-sided peritoneum was taken down from the medial umbilical ligament laterally. The pre-peritoneal  space was entered. Dissection was taken down to Cooper's ligament. At this time it was apparent there was a small indirect hernia.  At this time proceeded clean out the rest Cooper's ligament and the medial to lateral direction. A proceeded laterally to dissect the spermatic cord. The spermatic cord was circumferentially dissected away from the surrounding musculature and tissue. The vas deferens was identified and protected all portions of the case. There was a large indirect hernia at the base of the spermatic cord. This was dissected back.  There was a large cord lipoma associated with the cord. At this time I proceeded to create a pocket laterally for the mesh. Once the pocket was created the peritoneum was stripped back to approximately the base of the cord. At this time the piece of 16x12cm Progrip mesh was placed into the dissected area. This covered both the direct and indirect spaces appropriately. This also covered  The femoral space.  The mesh lay flat from medial to lateral.    At this time I turned my attention to the right side.  The peritoneum was taken down from the medial umbilical ligament laterally. The pre-peritoneal space was entered. Dissection was taken down to Cooper's ligament. At this time it was apparent there was a small indirect hernia.  At this time proceeded clean out the rest Cooper's ligament and the medial to lateral direction. A proceeded laterally to dissect the spermatic cord. The spermatic cord was circumferentially dissected away from the surrounding musculature and tissue. The vas deferens was identified and protected all portions of the case. There was a large indirect hernia  at the base of the spermatic cord. This was dissected back.  There was a large cord lipoma associated with the cord. At this time I proceeded to create a pocket laterally for the mesh. Once the pocket was created the peritoneum was stripped back to approximately the base of the cord.At this time the  piece of 16x12cm Progrip mesh was placed into the dissected area. This covered both the direct and indirect spaces appropriately. This also covered  The femoral space.  The mesh lay flat from medial to lateral.   At this time a 2-0 V- lock stitch was used to close the peritoneum in a [place] running fashion.  At this time the robot was undocked.   At this time the median incision was extended toward the umbilicus.  Blunt dissection was taken down to the anterior fascia.  The umbilical stalk was then taken off the abdominal wall.  The fascia was then elevated at the umbilical hernia site.  The surrounding fascia was then cleaned out from the subcutaneous tissue.  At this time the peritoneum was entered bluntly.  There is no bowel within the hernia.  At this time a piece of Bard ventrallex ST mesh, 6 cm was then placed into the intra-abdominal space.  This was secured laterally using 2-0 prolenes in an interrupted fashion x2.  The fascia was reapproximated from the mesh using 0 ethibonds in an interrupted fashion.  The umbilical stalk was then secured to the anterior abdominal wall using a 3-0 Vicryl x1.  The skin was then reapproximated 4-0 Monocryl subcuticular fashion.  The skin was then dressed with Dermabond.  The umbilical port site was reapproximated using a 0 Vicryl via an Endo Close device 1. All ports were removed. The skin was reapproximated and all port sites using 4-0 Monocryl subcuticular fashion.  The patient the procedure well was taken to the recovery    PLAN OF CARE: Discharge to home after PACU  PATIENT DISPOSITION:  PACU - hemodynamically stable.   Delay start of Pharmacological VTE agent (>24hrs) due to surgical blood loss or risk of bleeding: not applicable

## 2021-09-23 NOTE — Anesthesia Procedure Notes (Signed)
Procedure Name: Intubation Date/Time: 09/23/2021 9:03 AM Performed by: Inda Coke, CRNA Pre-anesthesia Checklist: Patient identified, Emergency Drugs available, Suction available and Patient being monitored Patient Re-evaluated:Patient Re-evaluated prior to induction Oxygen Delivery Method: Circle System Utilized Preoxygenation: Pre-oxygenation with 100% oxygen Induction Type: IV induction Ventilation: Mask ventilation without difficulty and Oral airway inserted - appropriate to patient size Laryngoscope Size: Mac and 4 Grade View: Grade I Tube type: Oral Tube size: 7.5 mm Number of attempts: 1 Airway Equipment and Method: Stylet and Oral airway Placement Confirmation: ETT inserted through vocal cords under direct vision, positive ETCO2 and breath sounds checked- equal and bilateral Secured at: 22 cm Tube secured with: Tape Dental Injury: Teeth and Oropharynx as per pre-operative assessment

## 2021-09-23 NOTE — Interval H&P Note (Signed)
History and Physical Interval Note:  09/23/2021 8:22 AM  Daniel Howe  has presented today for surgery, with the diagnosis of RECURRENT LEFT INGUINAL HERNIA, UMBILICAL HERNIA.  The various methods of treatment have been discussed with the patient and family. After consideration of risks, benefits and other options for treatment, the patient has consented to  Procedure(s): HERNIA REPAIR UMBILICAL ADULT, with mesh (Bilateral) XI ROBOTIC ASSISTED BILATERAL INGUINAL HERNIA with mesh, open possible - robotic bilateral (Bilateral) as a surgical intervention.  The patient's history has been reviewed, patient examined, no change in status, stable for surgery.  I have reviewed the patient's chart and labs.  Questions were answered to the patient's satisfaction.     Ralene Ok

## 2021-09-23 NOTE — Transfer of Care (Signed)
Immediate Anesthesia Transfer of Care Note  Patient: Daniel Howe  Procedure(s) Performed: UMBILICAL HERNIA REPAIR (Abdomen) XI ROBOTIC ASSISTED BILATERAL INGUINAL HERNIA (Bilateral: Abdomen) INSERTION OF MESH (Bilateral: Abdomen)  Patient Location: PACU  Anesthesia Type:General  Level of Consciousness: awake, alert  and oriented  Airway & Oxygen Therapy: Patient Spontanous Breathing and Patient connected to nasal cannula oxygen  Post-op Assessment: Report given to RN and Post -op Vital signs reviewed and stable  Post vital signs: Reviewed and stable  Last Vitals:  Vitals Value Taken Time  BP 143/72 09/23/21 1059  Temp    Pulse 69 09/23/21 1101  Resp 14 09/23/21 1101  SpO2 100 % 09/23/21 1101  Vitals shown include unvalidated device data.  Last Pain:  Vitals:   09/23/21 0743  TempSrc:   PainSc: 0-No pain         Complications: No notable events documented.

## 2021-09-23 NOTE — Progress Notes (Signed)
Anesthesiologist called Rosendo Gros and cardiologist consulted. Patients cardiologist didn't come bedside but told anesthesiologist patient was good to discharge and follow up with them. Dr. Rosendo Gros called to make aware of all this. Patient is asymptomatic but been in 2nd AV block in PACU. Instructions called to wife and instructed to follow up with cardiology.

## 2021-09-24 ENCOUNTER — Encounter (HOSPITAL_COMMUNITY): Payer: Self-pay | Admitting: General Surgery

## 2021-10-02 NOTE — Progress Notes (Unsigned)
Cardiology Office Note Date:  10/03/2021  Patient ID:  Daniel Howe, Daniel Howe 10-Feb-1941, MRN [id number] PCP:  Sueanne Margarita, DO  Electrophysiologist: Dr. Caryl Comes    Chief Complaint:  abnormal EKG  History of Present Illness: Daniel Howe is a 81 y.o. male with history of anxiety/depression, HTN, CKD (IV), bradycardia  He comes in today to be seen for Dr. 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  09/03/21 he underwent OPEN UMBILICAL HERNIA REPAIR WITH MESH XI ROBOTIC ASSISTED BILATERAL INGUINAL HERNIA (Bilateral) INSERTION OF MESH (Bilateral).  Intraoperatively had bradycardia requiring medical intervention by the anesthesiologist.  Post-op surgical team reached out to EP for abnormal EKG. In my review, largely unchanged in comparison to EKGs from years past.  Discussed with EP MD in hospital that day who was in agreement that with stable vitals and no symptoms, no particular intervention recommended and planned for office f/u.  TODAY He is healing/recovering well from his surgery. Back to walking, 1 mile/day most days, typically does more but taking it easy post ip. No CP, palpitations or cardiac awareness His initial BP Is high here, but he says that it has been a bit of a tough year, struggled through prostate cancer management, then had to have the surgery, then they were concerned about his EKG and coming here today was a little anxiety provoking again.  He denies any unusual fatigue, no dizzy spells, near syncope or syncope. No SOB, DOE No changes to his exertional capacity.  Past Medical History:  Diagnosis Date   Anxiety    Arthritis    Asthma    In childhood   Chronic kidney disease    Renal insufficiency   Depression    Dysrhythmia    hx of bradycardia, pauses shown on holter monitor from 2021   Hx of radiation therapy     Hypertension    Pneumonia    Several times as a child   Prostate cancer [facility])    Sleep apnea     Past Surgical History:  Procedure Laterality Date   CATARACT EXTRACTION     CORNEAL TRANSPLANT     HERNIA REPAIR  03/09/12   lih repair   INGUINAL HERNIA REPAIR  03/09/2012   Procedure: HERNIA REPAIR INGUINAL ADULT;  Surgeon: Pedro Earls, MD;  Location: [facility];  Service: General;  Laterality: Left;   INSERTION OF MESH Bilateral 09/23/2021   Procedure: INSERTION OF MESH;  Surgeon: Ralene Ok, MD;  Location: [place];  Service: General;  Laterality: Bilateral;   UMBILICAL HERNIA REPAIR  09/23/2021   Procedure: OPEN UMBILICAL HERNIA REPAIR WITH MESH;  Surgeon: Ralene Ok, MD;  Location: [place];  Service: General;;    Current Outpatient Medications  Medication Sig Dispense Refill   atorvastatin (LIPITOR) 40 MG tablet TAKE 1 TABLET ONCE DAILY. 90 tablet 1   Cholecalciferol (VITAMIN D) 50 MCG (2000 UT) CAPS Take 2,000 Units by mouth daily.     melatonin 5 MG TABS Take 5 mg by mouth at bedtime as needed (sleep).     sertraline (ZOLOFT) 50 MG tablet Take 50 mg by mouth daily.     telmisartan (MICARDIS) 40 MG tablet Take 1 tablet (40 mg total) by mouth daily. 90 tablet 1   traMADol (ULTRAM) 50 MG tablet Take 1 tablet (50 mg total) by mouth every 6 (six) hours as needed. 20 tablet 0  No current facility-administered medications for this visit.    Allergies:   Lexapro [escitalopram]   Social History:  The patient  reports that he has never smoked. He has never used smokeless tobacco. He reports current alcohol use of about 14.0 standard drinks per week. He reports that he does not use drugs.   Family History:  The patient's family history includes Diabetes in his brother; Heart disease in his brother.  ROS:  Please see the history of present illness.    All other systems are reviewed and otherwise negative.   PHYSICAL EXAM:  VS:  BP (!) 148/80   Pulse 83   Ht '6\' 2"'$  (1.88 m)    Wt 216 lb (98 kg)   SpO2 97%   BMI 27.73 kg/m  BMI: Body mass index is 27.73 kg/m. Well nourished, well developed, in no acute distress HEENT: normocephalic, atraumatic Neck: no JVD, carotid bruits or masses Cardiac:  RRR; no significant murmurs, no rubs, or gallops Lungs:  CTA b/l, no wheezing, rhonchi or rales Abd: soft, nontender to light palpations, incisions appear to be healing well, some scattered ecchymosis MS: no deformity or atrophy Ext: no edema Skin: warm and dry, no rash Neuro:  No gross deficits appreciated Psych: euthymic mood, full affect   EKG:  Done today and reviewed by myself shows  Junctional rhythm vs SR with long 1st degree AVBlock with Mobitz one, narrow QRS 176m Much the same as prior EKGs    Recent Labs: 09/17/2021: BUN 29; Creatinine, Ser 1.52; Hemoglobin 14.1; Platelets 176; Potassium 4.1; Sodium 136  No results found for requested labs within last 8760 hours.   Estimated Creatinine Clearance: 46.6 mL/min (A) (by C-G formula based on SCr of 1.52 mg/dL (H)).   Wt Readings from Last 3 Encounters:  10/03/21 216 lb (98 kg)  09/23/21 215 lb (97.5 kg)  09/17/21 217 lb (98.4 kg)     Other studies reviewed: Additional studies/records reviewed today include: summarized above  ASSESSMENT AND PLAN:  Known baseline bradycardia, AV block Described by Dr. 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  The patient did not have any concerns regarding medicines.  SiVenetia NightPA-C 10/03/2021 12:18 PM     [place] [place] [phone])  [phone](fax)

## 2021-10-03 ENCOUNTER — Encounter: Payer: Self-pay | Admitting: Physician Assistant

## 2021-10-03 ENCOUNTER — Ambulatory Visit: Payer: Medicare Other | Admitting: Physician Assistant

## 2021-10-03 VITALS — BP 148/80 | HR 83 | Ht 74.0 in | Wt 216.0 lb

## 2021-10-03 DIAGNOSIS — R9431 Abnormal electrocardiogram [ECG] [EKG]: Secondary | ICD-10-CM

## 2021-10-03 DIAGNOSIS — I441 Atrioventricular block, second degree: Secondary | ICD-10-CM | POA: Diagnosis not present

## 2021-10-03 NOTE — Patient Instructions (Signed)
Medication Instructions:   Your physician recommends that you continue on your current medications as directed. Please refer to the Current Medication list given to you today.   *If you need a refill on your cardiac medications before your next appointment, please call your pharmacy*   Lab Work: [employer]    If you have labs (blood work) drawn today and your tests are completely normal, you will receive your results only by: [place] (if you have MyChart) OR A paper copy in the mail If you have any lab test that is abnormal or we need to change your treatment, we will call you to review the results.   Testing/Procedures: NONE ORDERED  TODAY   Follow-Up: At [facility], you and your health needs are our priority.  As part of our continuing mission to provide you with exceptional heart care, we have created designated Provider Care Teams.  These Care Teams include your primary Cardiologist (physician) and Advanced Practice Providers (APPs -  Physician Assistants and Nurse Practitioners) who all work together to provide you with the care you need, when you need it.  We recommend signing up for the patient portal called "MyChart".  Sign up information is provided on this After Visit Summary.  MyChart is used to connect with patients for Virtual Visits (Telemedicine).  Patients are able to view lab/test results, encounter notes, upcoming appointments, etc.  Non-urgent messages can be sent to your provider as well.   To learn more about what you can do with MyChart, go to [email or web address].    Your next appointment:   6 month(s)  The format for your next appointment:   In Person  Provider:   You may see  or one of the following Advanced Practice Providers on your designated Care Team:   Tommye Standard, [place] Legrand Como "Jonni Sanger" Chalmers Cater, [place]    Other Instructions   Important Information About Sugar

## 2021-10-06 NOTE — Addendum Note (Signed)
Addended by: Gwendlyn Deutscher on: 10/06/2021 08:48 AM   Modules accepted: Orders

## 2022-02-03 ENCOUNTER — Other Ambulatory Visit: Payer: Self-pay | Admitting: Internal Medicine

## 2022-02-03 ENCOUNTER — Ambulatory Visit
Admission: RE | Admit: 2022-02-03 | Discharge: 2022-02-03 | Disposition: A | Discharge status: Home / Self Care | Payer: Medicare Other | Source: Ambulatory Visit | Attending: Internal Medicine | Admitting: Internal Medicine

## 2022-02-03 DIAGNOSIS — R7989 Other specified abnormal findings of blood chemistry: Secondary | ICD-10-CM

## 2022-03-10 ENCOUNTER — Other Ambulatory Visit (HOSPITAL_BASED_OUTPATIENT_CLINIC_OR_DEPARTMENT_OTHER): Payer: Self-pay

## 2022-03-10 MED ORDER — COMIRNATY 30 MCG/0.3ML IM SUSY
PREFILLED_SYRINGE | INTRAMUSCULAR | 0 refills | Status: DC
  Filled 2022-03-10: qty 0.3, 1d supply, fill #0

## 2022-05-18 ENCOUNTER — Ambulatory Visit: Payer: Medicare Other | Admitting: Internal Medicine

## 2022-05-31 IMAGING — US US RENAL
1 series · 14 of 25 positions shown · non-contrast
Comparison: None.

CLINICAL DATA: Stage 3 chronic kidney disease

EXAM:
RENAL / URINARY TRACT ULTRASOUND COMPLETE

[Series 1: us renal · 0.23mm/px · 14 of 33 slices shown]
[im 1/33]
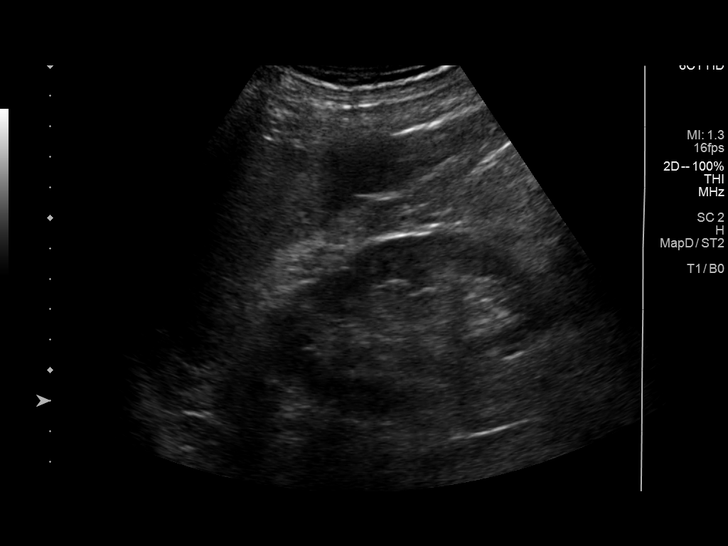
[im 3/33]
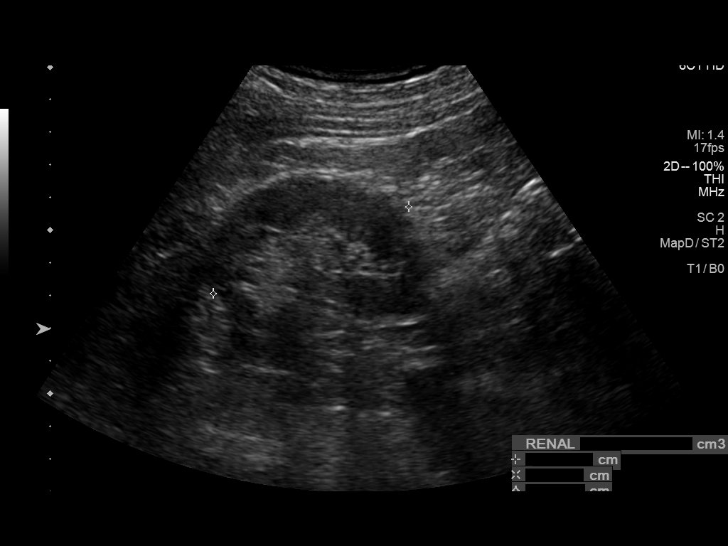
[im 6/33]
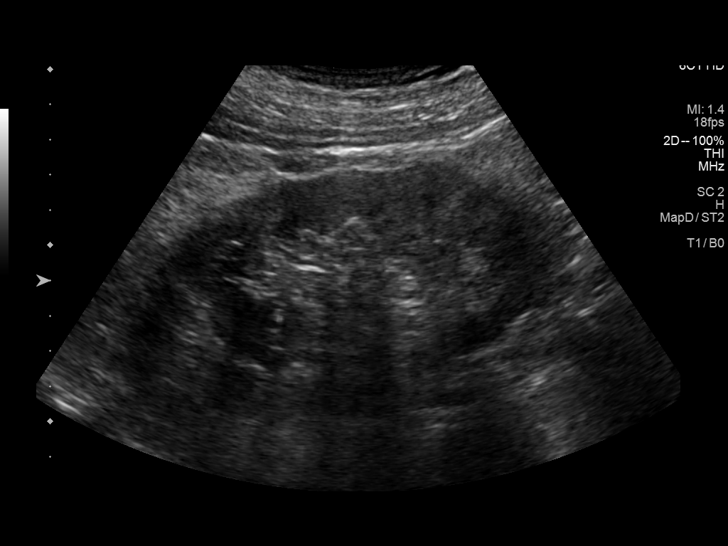
[im 9/33]
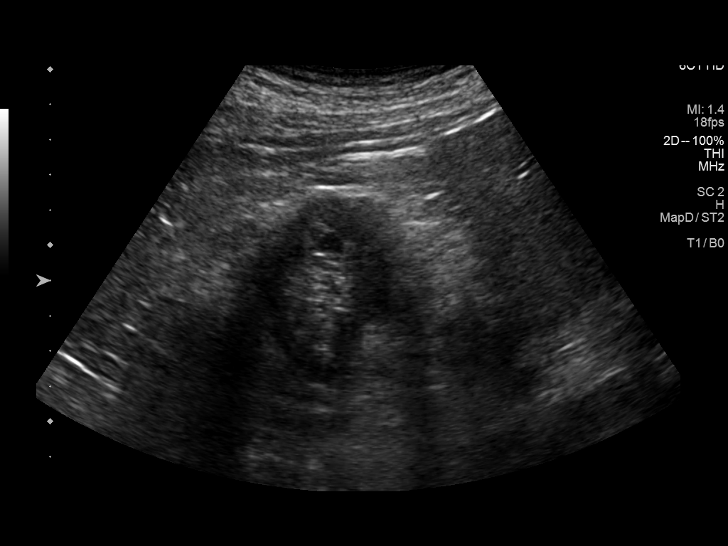
[im 11/33]
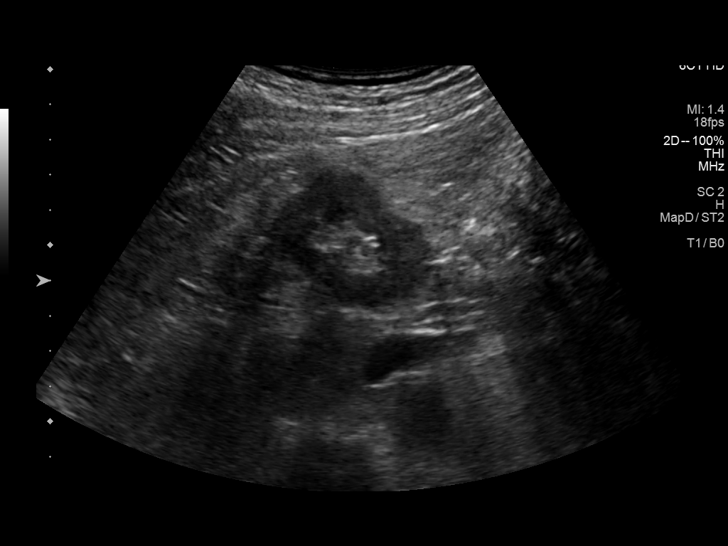
[im 13/33]
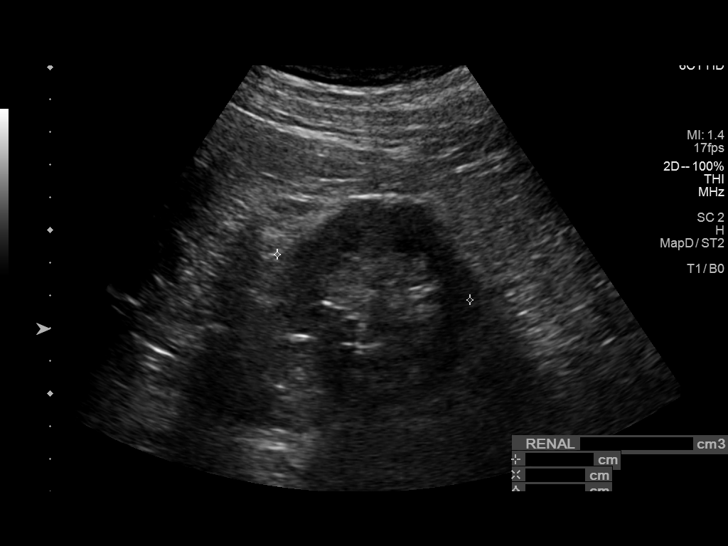
[im 15/33]
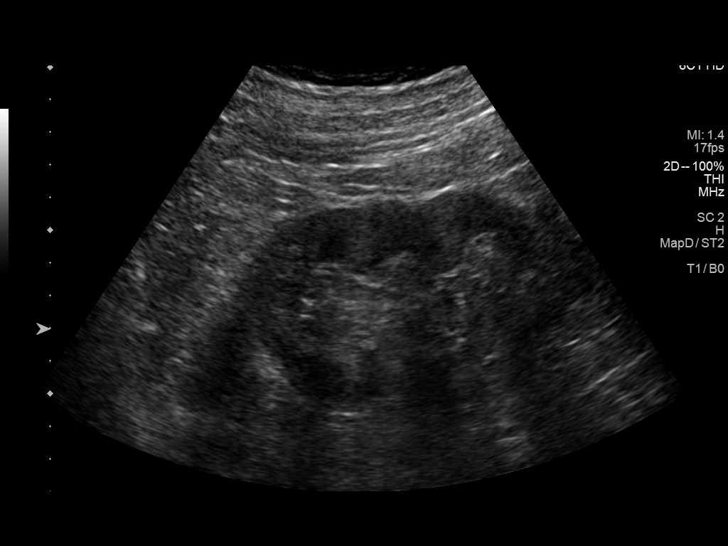
[im 18/33]
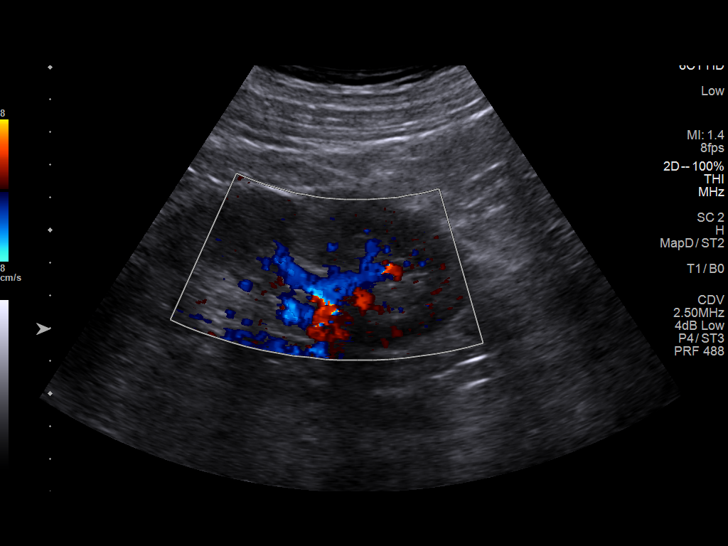
[im 21/33]
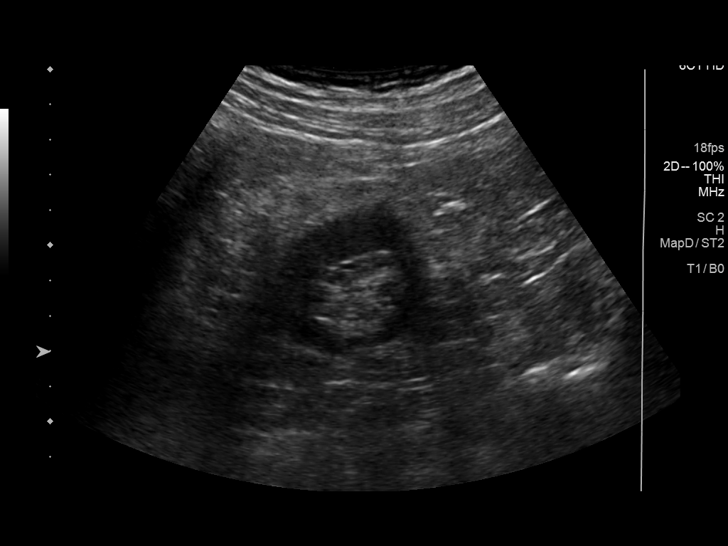
[im 22/33]
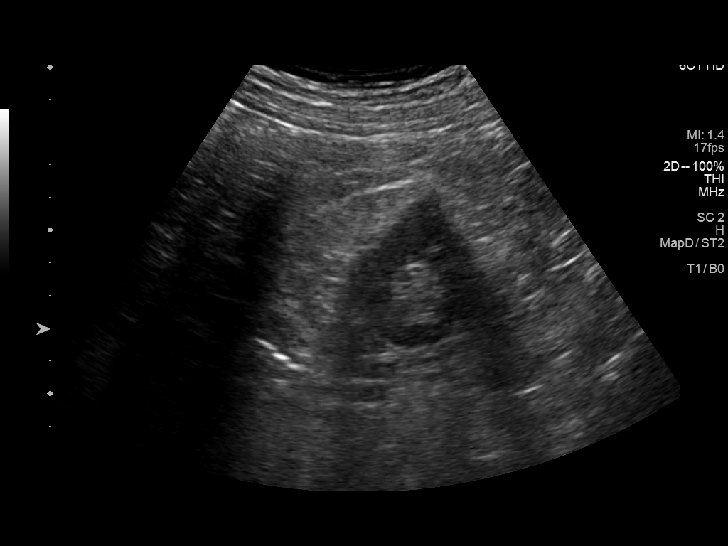
[im 25/33]
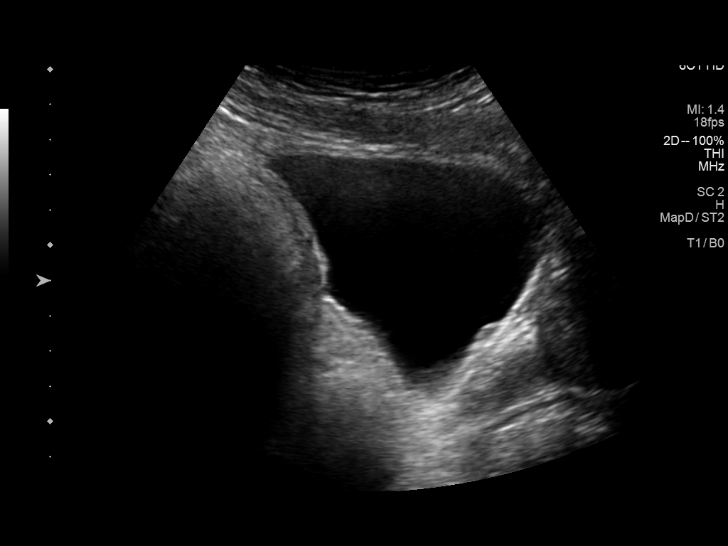
[im 27/33]
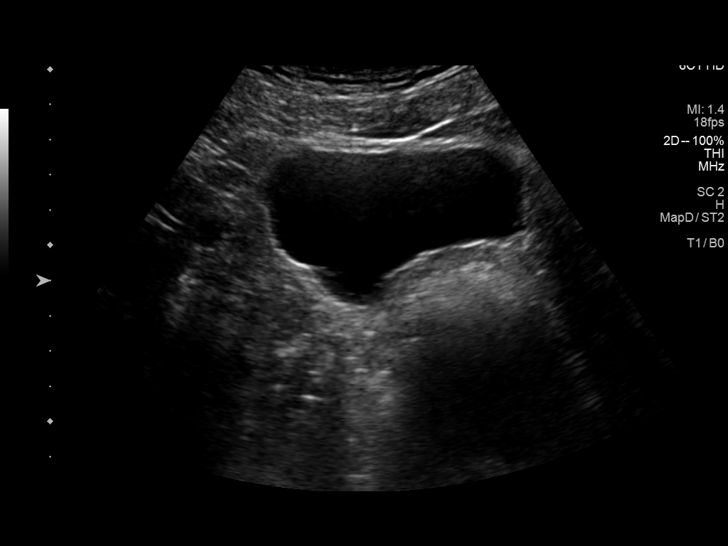
[im 30/33]
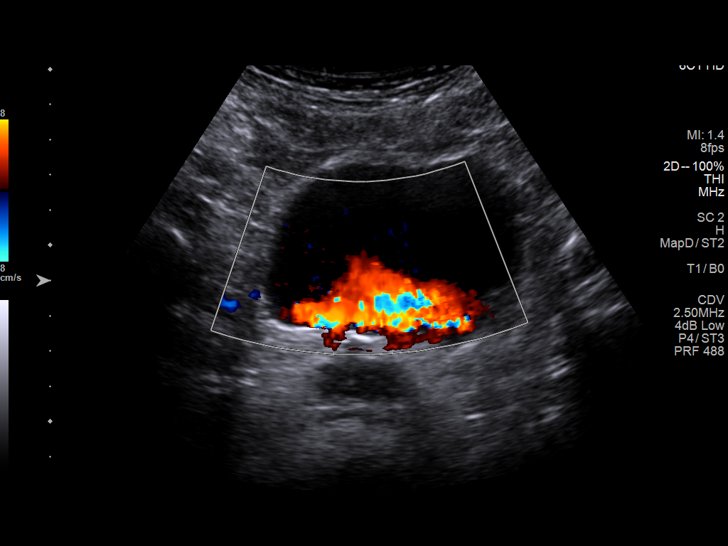
[im 33/33]
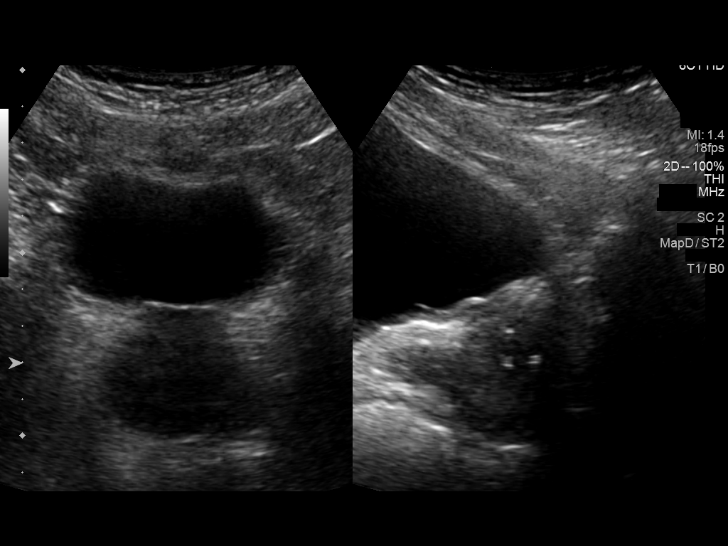

[14 of 25 positions shown; findings below may reference images not displayed]

FINDINGS: Right Kidney:

Renal measurements: 11.3 x 5.2 x 6.5 cm = volume: 202 mL.
Echogenicity within normal limits. No mass or hydronephrosis
visualized.

Left Kidney:

Renal measurements: 11.7 x 6.6 x 6.1 cm = volume: 244 mL.
Echogenicity within normal limits. No mass or hydronephrosis
visualized.

Bladder:

Appears normal for degree of bladder distention.

Other:

Enlarged prostate.
IMPRESSION: No significant abnormality of kidneys or bladder.

## 2022-06-02 DIAGNOSIS — I440 Atrioventricular block, first degree: Secondary | ICD-10-CM | POA: Insufficient documentation

## 2022-06-03 ENCOUNTER — Encounter: Payer: Self-pay | Admitting: Internal Medicine

## 2022-06-03 ENCOUNTER — Ambulatory Visit: Payer: Medicare Other | Attending: Internal Medicine | Admitting: Internal Medicine

## 2022-06-03 VITALS — BP 160/94 | HR 87 | Ht 74.0 in | Wt 218.6 lb

## 2022-06-03 DIAGNOSIS — Z79899 Other long term (current) drug therapy: Secondary | ICD-10-CM

## 2022-06-03 DIAGNOSIS — I440 Atrioventricular block, first degree: Secondary | ICD-10-CM

## 2022-06-03 DIAGNOSIS — I10 Essential (primary) hypertension: Secondary | ICD-10-CM

## 2022-06-03 DIAGNOSIS — I441 Atrioventricular block, second degree: Secondary | ICD-10-CM | POA: Diagnosis not present

## 2022-06-03 MED ORDER — HYDROCHLOROTHIAZIDE 12.5 MG PO CAPS: 12.5000 mg | ORAL_CAPSULE | Freq: Every day | ORAL | 3 refills | Status: DC

## 2022-06-03 NOTE — Patient Instructions (Signed)
Medication Instructions:  Your physician has recommended you make the following change in your medication:   ** Begin HCTZ 12.'5mg'$  - 1 capsule daily  *If you need a refill on your cardiac medications before your next appointment, please call your pharmacy*   Lab Work: BMET in 2 weeks If you have labs (blood work) drawn today and your tests are completely normal, you will receive your results only by: [place] (if you have MyChart) OR A paper copy in the mail If you have any lab test that is abnormal or we need to change your treatment, we will call you to review the results.   Testing/Procedures: None ordered.    Follow-Up: At [facility], you and your health needs are our priority.  As part of our continuing mission to provide you with exceptional heart care, we have created designated Provider Care Teams.  These Care Teams include your primary Cardiologist (physician) and Advanced Practice Providers (APPs -  Physician Assistants and Nurse Practitioners) who all work together to provide you with the care you need, when you need it.  We recommend signing up for the patient portal called "MyChart".  Sign up information is provided on this After Visit Summary.  MyChart is used to connect with patients for Virtual Visits (Telemedicine).  Patients are able to view lab/test results, encounter notes, upcoming appointments, etc.  Non-urgent messages can be sent to your provider as well.   To learn more about what you can do with MyChart, go to [email or web address].    Your next appointment:   12 months with Dr Caryl Comes

## 2022-06-03 NOTE — Progress Notes (Signed)
Patient Care Team: Sueanne Margarita, DO as PCP - General (Internal Medicine) Remi Haggard, MD as Consulting Physician (Urology) Tyler Pita, MD as Consulting Physician (Radiation Oncology) Delice Bison Charlestine Massed, NP as Nurse Practitioner (Hematology and Oncology) Harmon Pier, RN as Registered Nurse   HPI  Daniel Howe is a 82 y.o. male Seen  in follow-up of an abnormal Holter monitor 3/20 undertaken by his PCP for bradycardia.  It was personally reviewed.  He had 3+ second pauses during the day with multiple nonconducted P waves.  There was concomitant PP prolongation suggestive of hyper vagotonia.     Hernia surgery spring 2023 also issues related to prostate cancer  The patient denies chest pain, shortness of breath, nocturnal dyspnea, orthopnea or peripheral edema.  There have been no palpitations, lightheadedness or syncope. .     With prior hypertension we have started him on hydrochlorothiazide in addition to his olmesartan.  This was stopped by his PCP 12/21 because of "prerenal azotemia "although to my reading the BUN/creatinine ratios are pretty stable  Date PR Interval  6/13 242  5/18 500(about)  3/20 540  12/20 >600 ? junctional  6/23 Sinus Mobitz 1 first conducted 53mec   HolterLongAVblock >> 300>>5074mc   Date Cr K TSH Hgb  3/20 1.74 4.7 4.03 14.3   4/21 1.62 4.2 1.32   12/21 1.88     5/23 1.53 4.1  14.1      Records and Results Reviewed *  Past Medical History:  Diagnosis Date   Anxiety    Arthritis    Asthma    In childhood   Chronic kidney disease    Renal insufficiency   Depression    Dysrhythmia    hx of bradycardia, pauses shown on holter monitor from 2021   Hx of radiation therapy    Hypertension    Pneumonia    Several times as a child   [facility]   [facility]     Past Surgical History:  Procedure Laterality Date   CATARACT EXTRACTION     CORNEAL TRANSPLANT     HERNIA REPAIR  03/09/12    lih repair   INGUINAL HERNIA REPAIR  03/09/2012   Procedure: HERNIA REPAIR INGUINAL ADULT;  Surgeon: MaPedro EarlsMD;  [facility];  Service: General;  Laterality: Left;   INSERTION OF MESH Bilateral 09/23/2021   Procedure: INSERTION OF MESH;  Surgeon: RaRalene OkMD;  [place]: General;  Laterality: Bilateral;   UMBILICAL HERNIA REPAIR  09/23/2021   Procedure: OPEN UMBILICAL HERNIA REPAIR WITH MESH;  Surgeon: RaRalene OkMD;  [place] [place] [facility]: General;;    Current Meds  Medication Sig   atorvastatin (LIPITOR) 40 MG tablet TAKE 1 TABLET ONCE DAILY.   Cholecalciferol (VITAMIN D) 50 MCG (2000 UT) CAPS Take 2,000 Units by mouth daily.   COVID-19 mRNA vaccine 2023-2024 (COMIRNATY) syringe Inject into the muscle.   melatonin 5 MG TABS Take 5 mg by mouth at bedtime as needed (sleep).   olmesartan (BENICAR) 40 MG tablet Take 40 mg by mouth daily.   sertraline (ZOLOFT) 50 MG tablet Take 50 mg by mouth daily.   traMADol (ULTRAM) 50 MG tablet Take 1 tablet (50 mg total) by mouth every 6 (six) hours as needed.    Allergies  Allergen Reactions   Lexapro [Escitalopram] Diarrhea      Review of Systems negative except from HPI and PMH  Physical Exam BP (!) 142/80   Pulse 87   Ht '6\' 2"'$  (1.88 m)   Wt 218 lb 9.6 oz (99.2 kg)   SpO2 98%   BMI 28.07 kg/m  Well developed and nourished in no acute distress HENT normal Neck supple with JVP-  flat   Clear Regular rate and rhythm, no murmurs or gallops Abd-soft with active BS No Clubbing cyanosis edema Skin-warm and dry A & Oriented  Grossly normal sensory and motor function  ECG sinus at 100 with 2: 1 and 3: 2 presumed AV nodal Wenke block  Assessment and plan  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  We will add HCTZ back; we tried it a couple years ago Dr. Ronnald Ramp had been concerned about my review of the BUNs/creatinine ratios look relatively stable.  We will check his blood work in a couple of weeks  Despite his conduction system disease no significant symptoms.  Will continue to follow  Anxiety discussed quite problematic at times.  Also with some perseverative thoughts, suggested that he talk with his PCP before down titrating

## 2022-06-19 ENCOUNTER — Ambulatory Visit: Payer: Medicare Other

## 2022-06-20 LAB — BASIC METABOLIC PANEL
BUN/Creatinine Ratio: 23 (ref 10–24)
BUN: 41 mg/dL — ABNORMAL HIGH (ref 8–27)
CO2: 20 mmol/L (ref 20–29)
Calcium: 8.8 mg/dL (ref 8.6–10.2)
Chloride: 101 mmol/L (ref 96–106)
Creatinine, Ser: 1.75 mg/dL — ABNORMAL HIGH (ref 0.76–1.27)
Glucose: 194 mg/dL — ABNORMAL HIGH (ref 70–99)
Potassium: 4.8 mmol/L (ref 3.5–5.2)
Sodium: 133 mmol/L — ABNORMAL LOW (ref 134–144)
eGFR: 39 mL/min/{1.73_m2} — ABNORMAL LOW (ref >59)

## 2023-05-26 ENCOUNTER — Other Ambulatory Visit: Payer: Self-pay | Admitting: Internal Medicine

## 2023-06-23 ENCOUNTER — Encounter: Payer: Self-pay | Admitting: Internal Medicine

## 2023-06-23 ENCOUNTER — Ambulatory Visit: Payer: Medicare Other | Attending: Internal Medicine | Admitting: Internal Medicine

## 2023-06-23 VITALS — BP 128/66 | HR 58 | Ht 74.0 in | Wt 212.0 lb

## 2023-06-23 DIAGNOSIS — Z79899 Other long term (current) drug therapy: Secondary | ICD-10-CM

## 2023-06-23 DIAGNOSIS — Z01812 Encounter for preprocedural laboratory examination: Secondary | ICD-10-CM | POA: Diagnosis not present

## 2023-06-23 DIAGNOSIS — I4819 Other persistent atrial fibrillation: Secondary | ICD-10-CM

## 2023-06-23 DIAGNOSIS — I440 Atrioventricular block, first degree: Secondary | ICD-10-CM

## 2023-06-23 DIAGNOSIS — I441 Atrioventricular block, second degree: Secondary | ICD-10-CM | POA: Diagnosis not present

## 2023-06-23 LAB — BASIC METABOLIC PANEL
BUN/Creatinine Ratio: 21 (ref 10–24)
BUN: 39 mg/dL — ABNORMAL HIGH (ref 8–27)
CO2: 20 mmol/L (ref 20–29)
Calcium: 8.9 mg/dL (ref 8.6–10.2)
Chloride: 105 mmol/L (ref 96–106)
Creatinine, Ser: 1.88 mg/dL — ABNORMAL HIGH (ref 0.76–1.27)
Glucose: 146 mg/dL — ABNORMAL HIGH (ref 70–99)
Potassium: 4.3 mmol/L (ref 3.5–5.2)
Sodium: 139 mmol/L (ref 134–144)
eGFR: 36 mL/min/{1.73_m2} — ABNORMAL LOW (ref >59)

## 2023-06-23 LAB — CBC
Hematocrit: 38.8 % (ref 37.5–51.0)
Hemoglobin: 13.4 g/dL (ref 13.0–17.7)
MCH: 33.4 pg — ABNORMAL HIGH (ref 26.6–33.0)
MCHC: 34.5 g/dL (ref 31.5–35.7)
MCV: 97 fL (ref 79–97)
Platelets: 198 10*3/uL (ref 150–450)
RBC: 4.01 x10E6/uL — ABNORMAL LOW (ref 4.14–5.80)
RDW: 11.9 % (ref 11.6–15.4)
WBC: 10.9 10*3/uL — ABNORMAL HIGH (ref 3.4–10.8)

## 2023-06-23 MED ORDER — APIXABAN 2.5 MG PO TABS: 2.5000 mg | ORAL_TABLET | Freq: Two times a day (BID) | ORAL | 1 refills | Status: DC

## 2023-06-23 NOTE — Progress Notes (Signed)
Patient Care Team: Charlane Ferretti, DO as PCP - General (Internal Medicine) Belva Agee, MD (Inactive) as Consulting Physician (Urology) Margaretmary Dys, MD as Consulting Physician (Radiation Oncology) Axel Filler Larna Daughters, NP as Nurse Practitioner (Hematology and Oncology) Maryclare Labrador, RN as Registered Nurse   HPI  Daniel Howe is a 83 y.o. male Seen  in follow-up of an abnormal Holter monitor 3/20 undertaken by his PCP for bradycardia.  The patient denies chest pain, shortness of breath, nocturnal dyspnea, orthopnea or peripheral edema.  There have been no palpitations, lightheadedness or syncope.    1 he is in atrial fibrillation today and unaware of it.  It was personally reviewed.  He had 3+ second pauses during the day with multiple nonconducted P waves.  There was concomitant PP prolongation suggestive of hyper vagotonia.     Hernia surgery spring 2023 also issues related to prostate cancer    Date PR Interval  6/13 242  5/18 500(about)  3/20 540  12/20 >600 ? junctional  6/23 Sinus Mobitz 1 first conducted   HolterLongAVblock >> 300>>52msec   Date Cr K TSH Hgb  3/20 1.74 4.7 4.03 14.3   4/21 1.62 4.2 1.32   12/21 1.88     5/23 1.53 4.1  14.1  2/24 1.75 4.8        Records and Results Reviewed *  Past Medical History:  Diagnosis Date   Anxiety    Arthritis    Asthma    In childhood   Chronic kidney disease    Renal insufficiency   Depression    Dysrhythmia    hx of bradycardia, pauses shown on holter monitor from 2021   Hx of radiation therapy    Hypertension    Pneumonia    Several times as a child   Prostate cancer [facility])    Sleep apnea     Past Surgical History:  Procedure Laterality Date   CATARACT EXTRACTION     CORNEAL TRANSPLANT     HERNIA REPAIR  03/09/12   lih repair   INGUINAL HERNIA REPAIR  03/09/2012   Procedure: HERNIA REPAIR INGUINAL ADULT;  Surgeon: Valarie Merino, MD;  [facility];   Service: General;  Laterality: Left;   INSERTION OF MESH Bilateral 09/23/2021   Procedure: INSERTION OF MESH;  Surgeon: Axel Filler, MD;  [facility];  Service: General;  Laterality: Bilateral;   UMBILICAL HERNIA REPAIR  09/23/2021   Procedure: OPEN UMBILICAL HERNIA REPAIR WITH MESH;  Surgeon: Axel Filler, MD;  [facility];  Service: General;;    Current Meds  Medication Sig   atorvastatin (LIPITOR) 40 MG tablet TAKE 1 TABLET ONCE DAILY.   Cholecalciferol (VITAMIN D) 50 MCG (2000 UT) CAPS Take 2,000 Units by mouth daily.   COVID-19 mRNA vaccine 2023-2024 (COMIRNATY) syringe Inject into the muscle.   hydrochlorothiazide (MICROZIDE) 12.5 MG capsule Take 1 capsule (12.5 mg total) by mouth daily.   melatonin 5 MG TABS Take 5 mg by mouth at bedtime as needed (sleep).   olmesartan (BENICAR) 40 MG tablet Take 40 mg by mouth daily.   sertraline (ZOLOFT) 50 MG tablet Take 100 mg by mouth daily.   telmisartan (MICARDIS) 40 MG tablet Take 1 tablet (40 mg total) by mouth daily.    Allergies  Allergen Reactions   Lexapro [Escitalopram] Diarrhea      Review of Systems negative except from HPI and PMH  Physical Exam BP 128/66  Pulse (!) 58   Ht 6\' 2"  (1.88 m)   Wt 212 lb (96.2 kg)   SpO2 98%   BMI 27.22 kg/m  Well developed and nourished in no acute distress HENT normal Neck supple with JVP-  flat  Clear Irregularly Irregular rate and rhythm with controlled  ventricular response , no murmurs or gallops Abd-soft with active BS No Clubbing cyanosis edema Skin-warm and dry A & Oriented  Grossly normal sensory and motor function  ECG atrial fibrillation at 58 Intervals-/10/41   Assessment and Plan  Atrial fib  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  Will begin anticoagulation with apixaban.  Dose would be 2.5 mg based on age and renal function.  Will plan cardioversion x 1 month with some degree of trepidation given his history of bradycardia and second-degree AV block type I; his conduction system disease would not be changed by his atrial fibrillation.    Will check an echocardiogram.  Recheck metabolic profile and CBC in anticipation of the above medication changes  Blood pressure is well-controlled currently on his olmesartan and hydrochlorothiazide.

## 2023-06-23 NOTE — Patient Instructions (Addendum)
Medication Instructions:  Your physician has recommended you make the following change in your medication:   ** Begin Eliquis 2.5mg  - 1 tablet by mouth twice daily. - Do not miss any doses of this medication  *If you need a refill on your cardiac medications before your next appointment, please call your pharmacy*   Lab Work: CBC and BMET - today If you have labs (blood work) drawn today and your tests are completely normal, you will receive your results only by: MyChart Message (if you have MyChart) OR A paper copy in the mail If you have any lab test that is abnormal or we need to change your treatment, we will call you to review the results.   Testing/Procedures: Your physician has recommended that you have a Cardioversion (DCCV). Electrical Cardioversion uses a jolt of electricity to your heart either through paddles or wired patches attached to your chest. This is a controlled, usually prescheduled, procedure. Defibrillation is done under light anesthesia in the hospital, and you usually go home the day of the procedure. This is done to get your heart back into a normal rhythm. You are not awake for the procedure. Please see the instruction sheet given to you today.  Your physician has requested that you have an echocardiogram. Echocardiography is a painless test that uses sound waves to create images of your heart. It provides your doctor with information about the size and shape of your heart and how well your heart's chambers and valves are working. This procedure takes approximately one hour. There are no restrictions for this procedure. Please do NOT wear cologne, perfume, aftershave, or lotions (deodorant is allowed). Please arrive 15 minutes prior to your appointment time.  Please note: We ask at that you not bring children with you during ultrasound (echo/ vascular) testing. Due to room size and safety concerns, children are not allowed in the ultrasound rooms during exams. Our  front office staff cannot provide observation of children in our lobby area while testing is being conducted. An adult accompanying a patient to their appointment will only be allowed in the ultrasound room at the discretion of the ultrasound technician under special circumstances. We apologize for any inconvenience.     Follow-Up: At [facility], you and your health needs are our priority.  As part of our continuing mission to provide you with exceptional heart care, we have created designated Provider Care Teams.  These Care Teams include your primary Cardiologist (physician) and Advanced Practice Providers (APPs -  Physician Assistants and Nurse Practitioners) who all work together to provide you with the care you need, when you need it.  We recommend signing up for the patient portal called "MyChart".  Sign up information is provided on this After Visit Summary.  MyChart is used to connect with patients for Virtual Visits (Telemedicine).  Patients are able to view lab/test results, encounter notes, upcoming appointments, etc.  Non-urgent messages can be sent to your provider as well.   To learn more about what you can do with MyChart, go to [email or web address].    Your next appointment:   2 weeks after DCCV (March 21) with Afib clinic  Other Instructions     Dear Daniel Howe  You are scheduled for a Cardioversion on Friday, March 21 with Dr. Jacques Navy.  Please arrive at the [facility] (Main Entrance A) at [facility]: [address] [place], [place] [address] at 6:00 AM (This time is 1.5 hour(s) before your procedure to  ensure your preparation).   Free valet parking service is available. You will check in at ADMITTING.   *Please Note: You will receive a call the day before your procedure to confirm the appointment time. That time may have changed from the original time based on the schedule for that day.*   DIET:  Nothing to eat or drink after midnight except a  sip of water with medications (see medication instructions below)  MEDICATION INSTRUCTIONS: !!IF ANY NEW MEDICATIONS ARE STARTED AFTER TODAY, PLEASE NOTIFY YOUR PROVIDER AS SOON AS POSSIBLE!!  FYI: Medications such as Semaglutide (Ozempic, [place]), Tirzepatide (Mounjaro, Zepbound), Dulaglutide (Trulicity), etc ("GLP1 agonists") AND Canagliflozin (Invokana), Dapagliflozin (Farxiga), Empagliflozin (Jardiance), Ertugliflozin (Steglatro), Bexagliflozin [employer]) or any combination with one of these drugs such as Invokamet (Canagliflozin/Metformin), Synjardy (Empagliflozin/Metformin), etc ("SGLT2 inhibitors") must be held around the time of a procedure. This is not a comprehensive list of all of these drugs. Please review all of your medications and talk to your provider if you take any one of these. If you are not sure, ask your provider.   Please continue to take your Eliquis without missing any doses  Do Not take your Hydrochlorothiazide the morning of your procedure - you may take your other morning medications with sips of water.   LABS:  Your labs will be done at the hospital prior to your procedure - you will need to arrive 1 and 1/2 hours prior to your procedure.  FYI:  For your safety, and to allow [place] to monitor your vital signs accurately during the surgery/procedure we request: If you have artificial nails, gel coating, SNS etc, please have those removed prior to your surgery/procedure. Not having the nail coverings /polish removed may result in cancellation or delay of your surgery/procedure.  Your support person will be asked to wait in the waiting room during your procedure.  It is OK to have someone drop you off and come back when you are ready to be discharged.  You cannot drive after the procedure and will need someone to drive you home.  Bring your insurance cards.  *Special Note: Every effort is made to have your procedure done on time. Occasionally there are emergencies that  occur at the hospital that may cause delays. Please be patient if a delay does occur.

## 2023-06-23 NOTE — H&P (View-Only) (Signed)
 Patient Care Team: Charlane Ferretti, DO as PCP - General (Internal Medicine) Belva Agee, MD (Inactive) as Consulting Physician (Urology) Margaretmary Dys, MD as Consulting Physician (Radiation Oncology) Axel Filler Larna Daughters, NP as Nurse Practitioner (Hematology and Oncology) Maryclare Labrador, RN as Registered Nurse   HPI  Daniel Howe is a 83 y.o. male Seen  in follow-up of an abnormal Holter monitor 3/20 undertaken by his PCP for bradycardia.  The patient denies chest pain, shortness of breath, nocturnal dyspnea, orthopnea or peripheral edema.  There have been no palpitations, lightheadedness or syncope.    1 he is in atrial fibrillation today and unaware of it.  It was personally reviewed.  He had 3+ second pauses during the day with multiple nonconducted P waves.  There was concomitant PP prolongation suggestive of hyper vagotonia.     Hernia surgery spring 2023 also issues related to prostate cancer    Date PR Interval  6/13 242  5/18 500(about)  3/20 540  12/20 >600 ? junctional  6/23 Sinus Mobitz 1 first conducted   HolterLongAVblock >> 300>>52msec   Date Cr K TSH Hgb  3/20 1.74 4.7 4.03 14.3   4/21 1.62 4.2 1.32   12/21 1.88     5/23 1.53 4.1  14.1  2/24 1.75 4.8        Records and Results Reviewed *  Past Medical History:  Diagnosis Date   Anxiety    Arthritis    Asthma    In childhood   Chronic kidney disease    Renal insufficiency   Depression    Dysrhythmia    hx of bradycardia, pauses shown on holter monitor from 2021   Hx of radiation therapy    Hypertension    Pneumonia    Several times as a child   Prostate cancer [facility])    Sleep apnea     Past Surgical History:  Procedure Laterality Date   CATARACT EXTRACTION     CORNEAL TRANSPLANT     HERNIA REPAIR  03/09/12   lih repair   INGUINAL HERNIA REPAIR  03/09/2012   Procedure: HERNIA REPAIR INGUINAL ADULT;  Surgeon: Valarie Merino, MD;  Location: [facility] ORS;   Service: General;  Laterality: Left;   INSERTION OF MESH Bilateral 09/23/2021   Procedure: INSERTION OF MESH;  Surgeon: Axel Filler, MD;  Location: [facility];  Service: General;  Laterality: Bilateral;   UMBILICAL HERNIA REPAIR  09/23/2021   Procedure: OPEN UMBILICAL HERNIA REPAIR WITH MESH;  Surgeon: Axel Filler, MD;  Location: [facility] OR;  Service: General;;    Current Meds  Medication Sig   atorvastatin (LIPITOR) 40 MG tablet TAKE 1 TABLET ONCE DAILY.   Cholecalciferol (VITAMIN D) 50 MCG (2000 UT) CAPS Take 2,000 Units by mouth daily.   COVID-19 mRNA vaccine 2023-2024 (COMIRNATY) syringe Inject into the muscle.   hydrochlorothiazide (MICROZIDE) 12.5 MG capsule Take 1 capsule (12.5 mg total) by mouth daily.   melatonin 5 MG TABS Take 5 mg by mouth at bedtime as needed (sleep).   olmesartan (BENICAR) 40 MG tablet Take 40 mg by mouth daily.   sertraline (ZOLOFT) 50 MG tablet Take 100 mg by mouth daily.   telmisartan (MICARDIS) 40 MG tablet Take 1 tablet (40 mg total) by mouth daily.    Allergies  Allergen Reactions   Lexapro [Escitalopram] Diarrhea      Review of Systems negative except from HPI and PMH  Physical Exam BP 128/66  Pulse (!) 58   Ht 6\' 2"  (1.88 m)   Wt 212 lb (96.2 kg)   SpO2 98%   BMI 27.22 kg/m  Well developed and nourished in no acute distress HENT normal Neck supple with JVP-  flat  Clear Irregularly Irregular rate and rhythm with controlled  ventricular response , no murmurs or gallops Abd-soft with active BS No Clubbing cyanosis edema Skin-warm and dry A & Oriented  Grossly normal sensory and motor function  ECG atrial fibrillation at 58 Intervals-/10/41   Assessment and Plan  Atrial fib  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  Will begin anticoagulation with apixaban.  Dose would be 2.5 mg based on age and renal function.  Will plan cardioversion x 1 month with some degree of trepidation given his history of bradycardia and second-degree AV block type I; his conduction system disease would not be changed by his atrial fibrillation.    Will check an echocardiogram.  Recheck metabolic profile and CBC in anticipation of the above medication changes  Blood pressure is well-controlled currently on his olmesartan and hydrochlorothiazide.

## 2023-07-16 ENCOUNTER — Ambulatory Visit (HOSPITAL_COMMUNITY): Payer: Medicare Other | Attending: Internal Medicine

## 2023-07-16 DIAGNOSIS — I341 Nonrheumatic mitral (valve) prolapse: Secondary | ICD-10-CM | POA: Diagnosis not present

## 2023-07-16 DIAGNOSIS — I4819 Other persistent atrial fibrillation: Secondary | ICD-10-CM | POA: Insufficient documentation

## 2023-07-16 DIAGNOSIS — I361 Nonrheumatic tricuspid (valve) insufficiency: Secondary | ICD-10-CM | POA: Diagnosis not present

## 2023-07-16 LAB — ECHOCARDIOGRAM COMPLETE
Calc EF: 67 %
S' Lateral: 2.8 cm
Single Plane A2C EF: 70.7 %
Single Plane A4C EF: 62.9 %

## 2023-07-22 NOTE — Progress Notes (Addendum)
 Spoke to pt and instructed them to come at 0645 and to be NPO after 0000.  Confirmed no missed doses of AC and instructed to take in AM with a small sip of water.  Confirmed that pt will have a ride home and someone to stay with them for 24 hours after the procedure. Instructed patient to not wear any jewelry or lotion.

## 2023-07-23 ENCOUNTER — Ambulatory Visit (HOSPITAL_BASED_OUTPATIENT_CLINIC_OR_DEPARTMENT_OTHER): Admitting: Certified Registered Nurse Anesthetist

## 2023-07-23 ENCOUNTER — Encounter (HOSPITAL_COMMUNITY)
Admission: RE | Disposition: A | Discharge status: Home / Self Care | Payer: Self-pay | Source: Home / Self Care | Attending: Internal Medicine

## 2023-07-23 ENCOUNTER — Ambulatory Visit (HOSPITAL_COMMUNITY): Admitting: Certified Registered Nurse Anesthetist

## 2023-07-23 ENCOUNTER — Other Ambulatory Visit: Payer: Self-pay

## 2023-07-23 ENCOUNTER — Encounter (HOSPITAL_COMMUNITY): Payer: Self-pay | Admitting: Internal Medicine

## 2023-07-23 ENCOUNTER — Ambulatory Visit (HOSPITAL_COMMUNITY)
Admission: RE | Admit: 2023-07-23 | Discharge: 2023-07-23 | Disposition: A | Discharge status: Home / Self Care | Payer: Medicare Other | Attending: Internal Medicine | Admitting: Internal Medicine

## 2023-07-23 DIAGNOSIS — I4891 Unspecified atrial fibrillation: Secondary | ICD-10-CM

## 2023-07-23 DIAGNOSIS — Z7901 Long term (current) use of anticoagulants: Secondary | ICD-10-CM | POA: Diagnosis not present

## 2023-07-23 DIAGNOSIS — G4730 Sleep apnea, unspecified: Secondary | ICD-10-CM | POA: Insufficient documentation

## 2023-07-23 DIAGNOSIS — N184 Chronic kidney disease, stage 4 (severe): Secondary | ICD-10-CM | POA: Diagnosis not present

## 2023-07-23 DIAGNOSIS — I129 Hypertensive chronic kidney disease with stage 1 through stage 4 chronic kidney disease, or unspecified chronic kidney disease: Secondary | ICD-10-CM | POA: Diagnosis not present

## 2023-07-23 DIAGNOSIS — Z79899 Other long term (current) drug therapy: Secondary | ICD-10-CM | POA: Diagnosis not present

## 2023-07-23 DIAGNOSIS — Z8546 Personal history of malignant neoplasm of prostate: Secondary | ICD-10-CM | POA: Insufficient documentation

## 2023-07-23 DIAGNOSIS — I4819 Other persistent atrial fibrillation: Secondary | ICD-10-CM

## 2023-07-23 DIAGNOSIS — I441 Atrioventricular block, second degree: Secondary | ICD-10-CM | POA: Diagnosis not present

## 2023-07-23 HISTORY — PX: CARDIOVERSION: EP1203

## 2023-07-23 SURGERY — CARDIOVERSION (CATH LAB)
Anesthesia: General

## 2023-07-23 MED ORDER — SODIUM CHLORIDE 0.9 % IV SOLN: INTRAVENOUS | Status: DC

## 2023-07-23 MED ORDER — LIDOCAINE 2% (20 MG/ML) 5 ML SYRINGE
INTRAMUSCULAR | Status: DC | PRN
  Administered 2023-07-23: 100 mg via INTRAVENOUS

## 2023-07-23 MED ORDER — PROPOFOL 10 MG/ML IV BOLUS
INTRAVENOUS | Status: DC | PRN
  Administered 2023-07-23: 50 mg via INTRAVENOUS

## 2023-07-23 SURGICAL SUPPLY — 1 items: PAD DEFIB RADIO PHYSIO CONN (PAD) ×1 IMPLANT

## 2023-07-23 NOTE — Anesthesia Postprocedure Evaluation (Signed)
 Anesthesia Post Note  Patient: Daniel Howe  Procedure(s) Performed: CARDIOVERSION     Patient location during evaluation: PACU Anesthesia Type: General Level of consciousness: awake and alert Pain management: pain level controlled Vital Signs Assessment: post-procedure vital signs reviewed and stable Respiratory status: spontaneous breathing, nonlabored ventilation, respiratory function stable and patient connected to nasal cannula oxygen Cardiovascular status: blood pressure returned to baseline and stable Postop Assessment: no apparent nausea or vomiting Anesthetic complications: no   No notable events documented.  Last Vitals:  Vitals:   07/23/23 0840 07/23/23 0845  BP: (!) 112/100 115/66  Pulse: (!) 43 (!) 44  Resp: 15 14  Temp:    SpO2: 100% 100%    Last Pain:  Vitals:   07/23/23 0818  TempSrc: Temporal  PainSc: 0-No pain                 Mariann Barter

## 2023-07-23 NOTE — Interval H&P Note (Signed)
 History and Physical Interval Note:  07/23/2023 7:48 AM  Daniel Howe  has presented today for surgery, with the diagnosis of AFIB.  The various methods of treatment have been discussed with the patient and family. After consideration of risks, benefits and other options for treatment, the patient has consented to  Procedure(s): CARDIOVERSION (N/A) as a surgical intervention.  The patient's history has been reviewed, patient examined, no change in status, stable for surgery.  I have reviewed the patient's chart and labs.  Questions were answered to the patient's satisfaction.     Parke Poisson

## 2023-07-23 NOTE — CV Procedure (Signed)
 Procedure: Electrical Cardioversion Indications:  Atrial Fibrillation SVR  Procedure Details:  Consent: Risks of procedure as well as the alternatives and risks of each were explained to the (patient/caregiver).  Consent for procedure obtained.  Time Out: Verified patient identification, verified procedure, site/side was marked, verified correct patient position, special equipment/implants available, medications/allergies/relevent history reviewed, required imaging and test results available. PERFORMED.  Patient placed on cardiac monitor, pulse oximetry, supplemental oxygen as necessary.  Sedation given:  propofol per anesthesia Pacer pads placed anterior chest.  Cardioverted 1 time(s).  Cardioversion with synchronized biphasic 200J shock.  Evaluation: Findings: Post procedure EKG shows:  first deg AVB and second deg AVB mobitz I, sinus bradycardia Complications: None Patient did tolerate procedure well.  Time Spent Directly with the Patient:  30 minutes   Parke Poisson 07/23/2023, 8:19 AM

## 2023-07-23 NOTE — Anesthesia Preprocedure Evaluation (Signed)
 Anesthesia Evaluation  Patient identified by MRN, date of birth, ID band Patient awake    Reviewed: Allergy & Precautions, NPO status , Patient's Chart, lab work & pertinent test results, reviewed documented beta blocker date and time   History of Anesthesia Complications Negative for: history of anesthetic complications  Airway Mallampati: II  TM Distance: >3 FB     Dental no notable dental hx.    Pulmonary asthma , sleep apnea and Continuous Positive Airway Pressure Ventilation , pneumonia, neg COPD   breath sounds clear to auscultation       Cardiovascular hypertension, (-) angina (-) CAD, (-) Past MI and (-) Cardiac Stents + dysrhythmias Atrial Fibrillation  Rhythm:Irregular Rate:Bradycardia     Neuro/Psych neg Seizures PSYCHIATRIC DISORDERS Anxiety Depression       GI/Hepatic ,neg GERD  ,,(+) neg Cirrhosis        Endo/Other    Renal/GU CRFRenal disease     Musculoskeletal  (+) Arthritis ,    Abdominal   Peds  Hematology   Anesthesia Other Findings   Reproductive/Obstetrics                             Anesthesia Physical Anesthesia Plan  ASA: 2  Anesthesia Plan: General   Post-op Pain Management:    Induction: Intravenous  PONV Risk Score and Plan: 2 and Ondansetron  Airway Management Planned: Nasal Cannula and Natural Airway  Additional Equipment:   Intra-op Plan:   Post-operative Plan:   Informed Consent: I have reviewed the patients History and Physical, chart, labs and discussed the procedure including the risks, benefits and alternatives for the proposed anesthesia with the patient or authorized representative who has indicated his/her understanding and acceptance.     Dental advisory given  Plan Discussed with: CRNA  Anesthesia Plan Comments:        Anesthesia Quick Evaluation

## 2023-07-23 NOTE — Transfer of Care (Signed)
 Immediate Anesthesia Transfer of Care Note  Patient: Daniel Howe  Procedure(s) Performed: CARDIOVERSION  Patient Location: PACU and Cath Lab  Anesthesia Type:General  Level of Consciousness: drowsy  Airway & Oxygen Therapy: Patient Spontanous Breathing and Patient connected to nasal cannula oxygen  Post-op Assessment: Report given to RN and Post -op Vital signs reviewed and stable  Post vital signs: Reviewed and stable  Last Vitals:  Vitals Value Taken Time  BP 132/67 07/23/23 0800  Temp    Pulse 49 07/23/23 0802  Resp 21 07/23/23 0802  SpO2 100 % 07/23/23 0802  Vitals shown include unfiled device data.  Last Pain:  Vitals:   07/23/23 0716  TempSrc:   PainSc: 0-No pain         Complications: No notable events documented.

## 2023-08-16 ENCOUNTER — Encounter (HOSPITAL_COMMUNITY): Payer: Self-pay | Admitting: Physician Assistant

## 2023-08-16 ENCOUNTER — Ambulatory Visit (HOSPITAL_COMMUNITY)
Admission: RE | Admit: 2023-08-16 | Discharge: 2023-08-16 | Disposition: A | Discharge status: Home / Self Care | Payer: Medicare Other | Source: Ambulatory Visit | Attending: Physician Assistant | Admitting: Physician Assistant

## 2023-08-16 VITALS — BP 148/62 | HR 53 | Ht 74.0 in | Wt 211.2 lb

## 2023-08-16 DIAGNOSIS — I4819 Other persistent atrial fibrillation: Secondary | ICD-10-CM | POA: Insufficient documentation

## 2023-08-16 DIAGNOSIS — G4733 Obstructive sleep apnea (adult) (pediatric): Secondary | ICD-10-CM | POA: Insufficient documentation

## 2023-08-16 DIAGNOSIS — I129 Hypertensive chronic kidney disease with stage 1 through stage 4 chronic kidney disease, or unspecified chronic kidney disease: Secondary | ICD-10-CM | POA: Diagnosis not present

## 2023-08-16 DIAGNOSIS — N189 Chronic kidney disease, unspecified: Secondary | ICD-10-CM | POA: Insufficient documentation

## 2023-08-16 DIAGNOSIS — D6869 Other thrombophilia: Secondary | ICD-10-CM | POA: Diagnosis not present

## 2023-08-16 DIAGNOSIS — I442 Atrioventricular block, complete: Secondary | ICD-10-CM | POA: Insufficient documentation

## 2023-08-16 DIAGNOSIS — Z7901 Long term (current) use of anticoagulants: Secondary | ICD-10-CM | POA: Insufficient documentation

## 2023-08-16 DIAGNOSIS — Z8679 Personal history of other diseases of the circulatory system: Secondary | ICD-10-CM | POA: Insufficient documentation

## 2023-08-16 NOTE — Progress Notes (Signed)
 Primary Care Physician: Charlane Ferretti, DO Primary Cardiologist: None Electrophysiologist: Sherryl Manges, MD  Referring Physician: Dr Traci Sermon Daniel Howe is a 83 y.o. 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  The patient underwent DCCV for afib on 07/23/23 which was successful. Patient is on Eliquis for stroke prevention.   Patient presents today for follow up for atrial fibrillation. He reports that he feels well today. No bleeding issues on anticoagulation. He denies dizziness or presyncope.   Today, he denies symptoms of palpitations, chest pain, shortness of breath, orthopnea, PND, lower extremity edema, dizziness, presyncope, syncope, bleeding, or neurologic sequela. The patient is tolerating medications without difficulties and is otherwise without complaint today.    Atrial Fibrillation Risk Factors:  he does have symptoms or diagnosis of sleep apnea. he does not have a history of rheumatic fever.   Atrial Fibrillation Management history:  Previous antiarrhythmic drugs: none Previous cardioversions: 07/23/23 Previous ablations: none Anticoagulation history: Eliquis  ROS- All systems are reviewed and negative except as per the HPI above.  Past Medical History:  Diagnosis Date   Anxiety    Arthritis    Asthma    In childhood   Chronic kidney disease    Renal insufficiency   Depression    Dysrhythmia    hx of bradycardia, pauses shown on holter monitor from 2021   Hx of radiation therapy    Hypertension    Pneumonia    Several times as a child   Prostate cancer (HCC)    Sleep apnea     Current Outpatient Medications  Medication Sig Dispense Refill   apixaban (ELIQUIS) 2.5 MG TABS tablet Take 1 tablet (2.5 mg total) by mouth 2 (two) times daily. 180 tablet 1   atorvastatin (LIPITOR) 40 MG tablet TAKE 1 TABLET ONCE DAILY. 90 tablet 1    Cholecalciferol (VITAMIN D) 50 MCG (2000 UT) CAPS Take 2,000 Units by mouth daily.     COVID-19 mRNA vaccine 2023-2024 (COMIRNATY) syringe Inject into the muscle. 0.3 mL 0   hydrochlorothiazide (MICROZIDE) 12.5 MG capsule Take 1 capsule (12.5 mg total) by mouth daily. 90 capsule 0   melatonin 5 MG TABS Take 5 mg by mouth at bedtime as needed (sleep).     olmesartan (BENICAR) 40 MG tablet Take 40 mg by mouth daily.     sertraline (ZOLOFT) 50 MG tablet Take 100 mg by mouth daily.     No current facility-administered medications for this encounter.    Physical Exam: BP (!) 148/62   Pulse (!) 53   Ht 6\' 2"  (1.88 m)   Wt 95.8 kg   BMI 27.12 kg/m   GEN: Well nourished, well developed in no acute distress CARDIAC: Regular rate and rhythm, no murmurs, rubs, gallops RESPIRATORY:  Clear to auscultation without rales, wheezing or rhonchi  ABDOMEN: Soft, non-tender, non-distended EXTREMITIES:  No edema; No deformity   Wt Readings from Last 3 Encounters:  08/16/23 95.8 kg  07/23/23 96.2 kg  06/23/23 96.2 kg     EKG today demonstrates  CHB Vent. rate 53 BPM PR interval * ms QRS duration 96 ms QT/QTcB 412/386 ms   Echo 07/16/23 demonstrated   1. Left ventricular ejection fraction, by estimation, is 65 to 70%. Left  ventricular ejection fraction by 2D MOD biplane is 67.0 %. The left  ventricle has normal function. The  left ventricle has no regional wall  motion abnormalities. Left ventricular diastolic function could not be evaluated.   2. Right ventricular systolic function is normal. The right ventricular  size is mildly enlarged. There is normal pulmonary artery systolic  pressure. The estimated right ventricular systolic pressure is 24.3 mmHg.   3. Left atrial size was mildly dilated.   4. Right atrial size was moderately dilated.   5. The mitral valve is grossly normal. Mild mitral valve regurgitation.   6. The aortic valve is tricuspid. Aortic valve regurgitation is trivial.   Aortic valve sclerosis is present, with no evidence of aortic valve  stenosis.   7. The inferior vena cava is normal in size with greater than 50%  respiratory variability, suggesting right atrial pressure of 3 mmHg.     CHA2DS2-VASc Score = 3  The patient's score is based upon: CHF History: 0 HTN History: 1 Diabetes History: 0 Stroke History: 0 Vascular Disease History: 0 Age Score: 2 Gender Score: 0       ASSESSMENT AND PLAN: Persistent Atrial Fibrillation (ICD10:  I48.19) The patient's CHA2DS2-VASc score is 3, indicating a 3.2% annual risk of stroke.   S/p DCCV 07/23/23 Continue Eliquis 2.5 mg BID (age, Cr)  Secondary Hypercoagulable State (ICD10:  D68.69) The patient is at significant risk for stroke/thromboembolism based upon his CHA2DS2-VASc Score of 3.  Continue Apixaban (Eliquis). No bleeding issues.   CHB Reviewed ECG with Dr Daneil Dunker, appears his chronic conduction disease has progressed. He is asymptomatic, no dizziness or presyncope. Dr Daneil Dunker to see in office tomorrow for discussion of PPM.   HTN Stable on current regimen  OSA  Encouraged nightly CPAP   Follow up with Dr Daneil Dunker tomorrow.        Myrtha Ates PA-C [facility] [address] [place], [place] [address] [phone]

## 2023-08-17 ENCOUNTER — Ambulatory Visit: Attending: Cardiology | Admitting: Cardiology

## 2023-08-17 ENCOUNTER — Encounter: Payer: Self-pay | Admitting: Cardiology

## 2023-08-17 ENCOUNTER — Other Ambulatory Visit: Payer: Self-pay

## 2023-08-17 VITALS — BP 142/76 | HR 61 | Ht 74.0 in | Wt 213.0 lb

## 2023-08-17 DIAGNOSIS — I4439 Other atrioventricular block: Secondary | ICD-10-CM

## 2023-08-17 DIAGNOSIS — I10 Essential (primary) hypertension: Secondary | ICD-10-CM | POA: Diagnosis not present

## 2023-08-17 DIAGNOSIS — I4819 Other persistent atrial fibrillation: Secondary | ICD-10-CM

## 2023-08-17 DIAGNOSIS — I442 Atrioventricular block, complete: Secondary | ICD-10-CM

## 2023-08-17 DIAGNOSIS — R001 Bradycardia, unspecified: Secondary | ICD-10-CM

## 2023-08-17 DIAGNOSIS — D6869 Other thrombophilia: Secondary | ICD-10-CM

## 2023-08-17 NOTE — Progress Notes (Signed)
 Electrophysiology Office Note:   Date:  08/17/2023  ID:  Daniel Howe, DOB 1940/08/10, MRN [id number]  Primary Cardiologist: None Electrophysiologist: Sherryl Manges, MD      History of Present Illness:   Daniel Howe is a 83 y.o. 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   Discussed the use of AI scribe software for clinical note transcription with the patient, who gave verbal consent to proceed.  History of Present Illness He has a history of Mobitz 1 AV block, monitored over the past five years. He was recently diagnosed with AF and underwent DCCV on 07/23/23. He presented yesterday to AF Clinic for follow up and an EKG was concerning for high grade AV block. No symptoms of dizziness, lightheadedness, or syncope, although he experiences anxiety about the possibility of passing out. He has not engaged in his usual walking routine since being diagnosed with atrial fibrillation, but previously walked a mile in 22-23 minutes. He is currently on Eliquis for atrial fibrillation without any bleeding issues.    Review of systems complete and found to be negative unless listed in HPI.   EP Information / Studies Reviewed:       Echo 08/16/23: High grade AV block   Echo 07/16/23:  1. Left ventricular ejection fraction, by estimation, is 65 to 70%. Left  ventricular ejection fraction by 2D MOD biplane is 67.0 %. The left  ventricle has normal function. The left ventricle has no regional wall  motion abnormalities. Left ventricular  diastolic function could not be evaluated.   2. Right ventricular systolic function is normal. The right ventricular  size is mildly enlarged. There is normal pulmonary artery systolic  pressure. The estimated right ventricular systolic pressure is 24.3 mmHg.   3. Left atrial size was mildly dilated.   4. Right atrial size was moderately  dilated.   5. The mitral valve is grossly normal. Mild mitral valve regurgitation.   6. The aortic valve is tricuspid. Aortic valve regurgitation is trivial.  Aortic valve sclerosis is present, with no evidence of aortic valve  stenosis.   7. The inferior vena cava is normal in size with greater than 50%  respiratory variability, suggesting right atrial pressure of 3 mmHg.   Risk Assessment/Calculations:    CHA2DS2-VASc Score = 3   This indicates a 3.2% annual risk of stroke. The patient's score is based upon: CHF History: 0 HTN History: 1 Diabetes History: 0 Stroke History: 0 Vascular Disease History: 0 Age Score: 2 Gender Score: 0       Physical Exam:   VS:  BP (!) 142/76   Pulse 61   Ht 6\' 2"  (1.88 m)   Wt 213 lb (96.6 kg)   SpO2 99%   BMI 27.35 kg/m    Wt Readings from Last 3 Encounters:  08/17/23 213 lb (96.6 kg)  08/16/23 211 lb 3.2 oz (95.8 kg)  07/23/23 212 lb (96.2 kg)     GEN: Well nourished, well developed in no acute distress NECK: No JVD CARDIAC: Normal rate, irregular RESPIRATORY:  Clear to auscultation without rales, wheezing or rhonchi  ABDOMEN: Soft, non-distended EXTREMITIES:  No edema; No deformity   ASSESSMENT AND PLAN:    #. Bradycardia:  #. High grade AV block:  - Patient meets criteria for permanent pacemaker implant due to intermittent high grade AV block. Also has new diagnosis  of atrial fibrillation. Unable to initiate rate control or AAD therapy due to underlying bradycardia and conduction disease. Explained risks, benefits, and alternatives to pacemaker implantation.  Pt verbalized understanding and agrees to proceed if indicated.   #. Persistent atrial fibrllation: New diagnosis.  #. Secondary hypercoagulable state due to atrial fibrillation:  -Current management for AF limited by bradycardia and conduction disease. Will start metoprolol post pacemaker implant.   -Continue Eliquis 2.5mg  BID (age 35 and Cr 1.88). Minimize interruption  for pacemaker implant.   #. Hypertension -At goal today.  Recommend checking blood pressures 1-2 times per week at home and recording the values.  Recommend bringing these recordings to the primary care physician.   Follow up with Dr. Daneil Dunker  on Monday for pacemaker implant.     Total time of encounter: 67 minutes total time of encounter, including chart review, face-to-face patient care, coordination of care and counseling regarding high complexity medical decision making.  Signed, Ardeen Kohler, MD

## 2023-08-17 NOTE — H&P (View-Only) (Signed)
 Electrophysiology Office Note:   Date:  08/17/2023  ID:  Daniel Howe, DOB 1940/08/10, MRN [id number]  Primary Cardiologist: None Electrophysiologist: Sherryl Manges, MD      History of Present Illness:   Daniel Howe is a 83 y.o. 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   Discussed the use of AI scribe software for clinical note transcription with the patient, who gave verbal consent to proceed.  History of Present Illness He has a history of Mobitz 1 AV block, monitored over the past five years. He was recently diagnosed with AF and underwent DCCV on 07/23/23. He presented yesterday to AF Clinic for follow up and an EKG was concerning for high grade AV block. No symptoms of dizziness, lightheadedness, or syncope, although he experiences anxiety about the possibility of passing out. He has not engaged in his usual walking routine since being diagnosed with atrial fibrillation, but previously walked a mile in 22-23 minutes. He is currently on Eliquis for atrial fibrillation without any bleeding issues.    Review of systems complete and found to be negative unless listed in HPI.   EP Information / Studies Reviewed:       Echo 08/16/23: High grade AV block   Echo 07/16/23:  1. Left ventricular ejection fraction, by estimation, is 65 to 70%. Left  ventricular ejection fraction by 2D MOD biplane is 67.0 %. The left  ventricle has normal function. The left ventricle has no regional wall  motion abnormalities. Left ventricular  diastolic function could not be evaluated.   2. Right ventricular systolic function is normal. The right ventricular  size is mildly enlarged. There is normal pulmonary artery systolic  pressure. The estimated right ventricular systolic pressure is 24.3 mmHg.   3. Left atrial size was mildly dilated.   4. Right atrial size was moderately  dilated.   5. The mitral valve is grossly normal. Mild mitral valve regurgitation.   6. The aortic valve is tricuspid. Aortic valve regurgitation is trivial.  Aortic valve sclerosis is present, with no evidence of aortic valve  stenosis.   7. The inferior vena cava is normal in size with greater than 50%  respiratory variability, suggesting right atrial pressure of 3 mmHg.   Risk Assessment/Calculations:    CHA2DS2-VASc Score = 3   This indicates a 3.2% annual risk of stroke. The patient's score is based upon: CHF History: 0 HTN History: 1 Diabetes History: 0 Stroke History: 0 Vascular Disease History: 0 Age Score: 2 Gender Score: 0       Physical Exam:   VS:  BP (!) 142/76   Pulse 61   Ht 6\' 2"  (1.88 m)   Wt 213 lb (96.6 kg)   SpO2 99%   BMI 27.35 kg/m    Wt Readings from Last 3 Encounters:  08/17/23 213 lb (96.6 kg)  08/16/23 211 lb 3.2 oz (95.8 kg)  07/23/23 212 lb (96.2 kg)     GEN: Well nourished, well developed in no acute distress NECK: No JVD CARDIAC: Normal rate, irregular RESPIRATORY:  Clear to auscultation without rales, wheezing or rhonchi  ABDOMEN: Soft, non-distended EXTREMITIES:  No edema; No deformity   ASSESSMENT AND PLAN:    #. Bradycardia:  #. High grade AV block:  - Patient meets criteria for permanent pacemaker implant due to intermittent high grade AV block. Also has new diagnosis  of atrial fibrillation. Unable to initiate rate control or AAD therapy due to underlying bradycardia and conduction disease. Explained risks, benefits, and alternatives to pacemaker implantation.  Pt verbalized understanding and agrees to proceed if indicated.   #. Persistent atrial fibrllation: New diagnosis.  #. Secondary hypercoagulable state due to atrial fibrillation:  -Current management for AF limited by bradycardia and conduction disease. Will start metoprolol post pacemaker implant.   -Continue Eliquis 2.5mg  BID (age 35 and Cr 1.88). Minimize interruption  for pacemaker implant.   #. Hypertension -At goal today.  Recommend checking blood pressures 1-2 times per week at home and recording the values.  Recommend bringing these recordings to the primary care physician.   Follow up with Dr. Daneil Dunker  on Monday for pacemaker implant.     Total time of encounter: 67 minutes total time of encounter, including chart review, face-to-face patient care, coordination of care and counseling regarding high complexity medical decision making.  Signed, Ardeen Kohler, MD

## 2023-08-17 NOTE — Patient Instructions (Signed)
 Medication Instructions:  Your physician recommends that you continue on your current medications as directed. Please refer to the Current Medication list given to you today.  *If you need a refill on your cardiac medications before your next appointment, please call your pharmacy*  Lab Work: TODAY: CBC and BMET  Testing/Procedures: Pacemaker Implant Your physician has recommended that you have a pacemaker inserted. A pacemaker is a small device that is placed under the skin of your chest or abdomen to help control abnormal heart rhythms. This device uses electrical pulses to prompt the heart to beat at a normal rate. Pacemakers are used to treat heart rhythms that are too slow. Wire (leads) are attached to the pacemaker that goes into the chambers of you heart. This is done in the hospital and usually requires and overnight stay. Please see the instruction sheet given to you today for more information.  Follow-Up: At [facility], you and your health needs are our priority.  As part of our continuing mission to provide you with exceptional heart care, our providers are all part of one team.  This team includes your primary Cardiologist (physician) and Advanced Practice Providers or APPs (Physician Assistants and Nurse Practitioners) who all work together to provide you with the care you need, when you need it.  Your next appointment:   We will call you to schedule your post-procedure follow up appointments      1st Floor: - Lobby - Registration  - Pharmacy  - Lab - Cafe  2nd Floor: - PV Lab - Diagnostic Testing (echo, CT, nuclear med)  3rd Floor: - Vacant  4th Floor: - TCTS (cardiothoracic surgery) - AFib Clinic - Structural Heart Clinic - Vascular Surgery  - Vascular Ultrasound  5th Floor: - HeartCare Cardiology (general and EP) - Clinical Pharmacy for coumadin, hypertension, lipid, weight-loss medications, and med management appointments    Valet parking  services will be available as well.

## 2023-08-18 ENCOUNTER — Other Ambulatory Visit: Payer: Self-pay | Admitting: Internal Medicine

## 2023-08-18 LAB — CBC WITH DIFFERENTIAL/PLATELET
Basophils Absolute: 0 10*3/uL (ref 0.0–0.2)
Basos: 1 %
EOS (ABSOLUTE): 0.1 10*3/uL (ref 0.0–0.4)
Eos: 1 %
Hematocrit: 41.6 % (ref 37.5–51.0)
Hemoglobin: 13.8 g/dL (ref 13.0–17.7)
Immature Grans (Abs): 0 10*3/uL (ref 0.0–0.1)
Immature Granulocytes: 0 %
Lymphocytes Absolute: 1.2 10*3/uL (ref 0.7–3.1)
Lymphs: 15 %
MCH: 32.9 pg (ref 26.6–33.0)
MCHC: 33.2 g/dL (ref 31.5–35.7)
MCV: 99 fL — ABNORMAL HIGH (ref 79–97)
Monocytes Absolute: 1.2 10*3/uL — ABNORMAL HIGH (ref 0.1–0.9)
Monocytes: 15 %
Neutrophils Absolute: 5.3 10*3/uL (ref 1.4–7.0)
Neutrophils: 68 %
Platelets: 216 10*3/uL (ref 150–450)
RBC: 4.2 x10E6/uL (ref 4.14–5.80)
RDW: 12 % (ref 11.6–15.4)
WBC: 7.9 10*3/uL (ref 3.4–10.8)

## 2023-08-18 LAB — BASIC METABOLIC PANEL WITH GFR
BUN/Creatinine Ratio: 19 (ref 10–24)
BUN: 36 mg/dL — ABNORMAL HIGH (ref 8–27)
CO2: 21 mmol/L (ref 20–29)
Calcium: 9.3 mg/dL (ref 8.6–10.2)
Chloride: 105 mmol/L (ref 96–106)
Creatinine, Ser: 1.92 mg/dL — ABNORMAL HIGH (ref 0.76–1.27)
Glucose: 84 mg/dL (ref 70–99)
Potassium: 5.3 mmol/L — ABNORMAL HIGH (ref 3.5–5.2)
Sodium: 139 mmol/L (ref 134–144)
eGFR: 35 mL/min/{1.73_m2} — ABNORMAL LOW (ref >59)

## 2023-08-20 NOTE — Pre-Procedure Instructions (Signed)
 Instructed patient on the following items: Arrival time 1230 Nothing to eat or drink after midnight No meds AM of procedure Responsible person to drive you home and stay with you for 24 hrs Wash with special soap night before and morning of procedure If on anti-coagulant drug instructions Eliquis - last dose 4/19 am.

## 2023-08-23 ENCOUNTER — Other Ambulatory Visit: Payer: Self-pay

## 2023-08-23 ENCOUNTER — Ambulatory Visit (HOSPITAL_COMMUNITY)
Admission: RE | Admit: 2023-08-23 | Discharge: 2023-08-23 | Disposition: A | Discharge status: Home / Self Care | Attending: Cardiology | Admitting: Cardiology

## 2023-08-23 ENCOUNTER — Encounter (HOSPITAL_COMMUNITY)
Admission: RE | Disposition: A | Discharge status: Home / Self Care | Payer: Self-pay | Source: Home / Self Care | Attending: Cardiology

## 2023-08-23 ENCOUNTER — Ambulatory Visit (HOSPITAL_COMMUNITY)

## 2023-08-23 DIAGNOSIS — I4819 Other persistent atrial fibrillation: Secondary | ICD-10-CM | POA: Diagnosis not present

## 2023-08-23 DIAGNOSIS — I129 Hypertensive chronic kidney disease with stage 1 through stage 4 chronic kidney disease, or unspecified chronic kidney disease: Secondary | ICD-10-CM | POA: Diagnosis not present

## 2023-08-23 DIAGNOSIS — I442 Atrioventricular block, complete: Secondary | ICD-10-CM | POA: Diagnosis not present

## 2023-08-23 DIAGNOSIS — D6869 Other thrombophilia: Secondary | ICD-10-CM | POA: Insufficient documentation

## 2023-08-23 DIAGNOSIS — N189 Chronic kidney disease, unspecified: Secondary | ICD-10-CM | POA: Diagnosis not present

## 2023-08-23 DIAGNOSIS — Z7901 Long term (current) use of anticoagulants: Secondary | ICD-10-CM | POA: Insufficient documentation

## 2023-08-23 DIAGNOSIS — I441 Atrioventricular block, second degree: Secondary | ICD-10-CM | POA: Diagnosis present

## 2023-08-23 HISTORY — PX: PACEMAKER IMPLANT: EP1218

## 2023-08-23 LAB — BASIC METABOLIC PANEL WITH GFR
Anion gap: 10 (ref 5–15)
BUN: 40 mg/dL — ABNORMAL HIGH (ref 8–23)
CO2: 20 mmol/L — ABNORMAL LOW (ref 22–32)
Calcium: 8.8 mg/dL — ABNORMAL LOW (ref 8.9–10.3)
Chloride: 106 mmol/L (ref 98–111)
Creatinine, Ser: 1.77 mg/dL — ABNORMAL HIGH (ref 0.61–1.24)
GFR, Estimated: 38 mL/min — ABNORMAL LOW (ref >60)
Glucose, Bld: 107 mg/dL — ABNORMAL HIGH (ref 70–99)
Potassium: 4.2 mmol/L (ref 3.5–5.1)
Sodium: 136 mmol/L (ref 135–145)

## 2023-08-23 SURGERY — PACEMAKER IMPLANT

## 2023-08-23 MED ORDER — LIDOCAINE HCL (PF) 1 % IJ SOLN
INTRAMUSCULAR | Status: DC | PRN
  Administered 2023-08-23: 60 mL

## 2023-08-23 MED ORDER — SODIUM CHLORIDE 0.9 % IV SOLN
INTRAVENOUS | Status: AC
  Filled 2023-08-23: qty 2

## 2023-08-23 MED ORDER — LIDOCAINE HCL 1 % IJ SOLN
INTRAMUSCULAR | Status: AC
Start: 2023-08-23 — End: ?
  Filled 2023-08-23: qty 60

## 2023-08-23 MED ORDER — MIDAZOLAM HCL 5 MG/5ML IJ SOLN
INTRAMUSCULAR | Status: DC | PRN
  Administered 2023-08-23: 1 mg via INTRAVENOUS
  Administered 2023-08-23: .5 mg via INTRAVENOUS
  Administered 2023-08-23: 1 mg via INTRAVENOUS
  Administered 2023-08-23 (×2): .5 mg via INTRAVENOUS

## 2023-08-23 MED ORDER — MIDAZOLAM HCL 5 MG/5ML IJ SOLN
INTRAMUSCULAR | Status: AC
  Filled 2023-08-23: qty 5

## 2023-08-23 MED ORDER — SODIUM CHLORIDE 0.9 % IV SOLN
80.0000 mg | INTRAVENOUS | Status: AC
  Administered 2023-08-23: 80 mg

## 2023-08-23 MED ORDER — CEFAZOLIN SODIUM-DEXTROSE 2-4 GM/100ML-% IV SOLN
INTRAVENOUS | Status: DC
Start: 2023-08-23 — End: 2023-08-24
  Filled 2023-08-23: qty 100

## 2023-08-23 MED ORDER — CEFAZOLIN SODIUM-DEXTROSE 2-4 GM/100ML-% IV SOLN
2.0000 g | INTRAVENOUS | Status: AC
Start: 2023-08-23 — End: 2023-08-23
  Administered 2023-08-23: 2 g via INTRAVENOUS

## 2023-08-23 MED ORDER — ONDANSETRON HCL 4 MG/2ML IJ SOLN: 4.0000 mg | Freq: Four times a day (QID) | INTRAMUSCULAR | Status: DC | PRN

## 2023-08-23 MED ORDER — ACETAMINOPHEN 325 MG PO TABS: 325.0000 mg | ORAL_TABLET | ORAL | Status: DC | PRN

## 2023-08-23 MED ORDER — SODIUM CHLORIDE 0.9 % IV SOLN: INTRAVENOUS | Status: DC

## 2023-08-23 MED ORDER — APIXABAN 2.5 MG PO TABS
2.5000 mg | ORAL_TABLET | Freq: Two times a day (BID) | ORAL | 1 refills | Status: AC
Start: 2023-08-27 — End: ?

## 2023-08-23 MED ORDER — CHLORHEXIDINE GLUCONATE 4 % EX SOLN: 4.0000 | Freq: Once | CUTANEOUS | Status: DC

## 2023-08-23 MED ORDER — HEPARIN (PORCINE) IN NACL 1000-0.9 UT/500ML-% IV SOLN
INTRAVENOUS | Status: DC | PRN
Start: 2023-08-23 — End: 2023-08-24
  Administered 2023-08-23: 500 mL

## 2023-08-23 MED ORDER — FENTANYL CITRATE (PF) 100 MCG/2ML IJ SOLN
INTRAMUSCULAR | Status: AC
  Filled 2023-08-23: qty 2

## 2023-08-23 MED ORDER — FENTANYL CITRATE (PF) 100 MCG/2ML IJ SOLN
INTRAMUSCULAR | Status: DC | PRN
  Administered 2023-08-23 (×2): 25 ug via INTRAVENOUS
  Administered 2023-08-23: 50 ug via INTRAVENOUS
  Administered 2023-08-23 (×2): 25 ug via INTRAVENOUS

## 2023-08-23 MED ORDER — POVIDONE-IODINE 10 % EX SWAB
2.0000 | Freq: Once | CUTANEOUS | Status: AC
  Administered 2023-08-23: 2 via TOPICAL

## 2023-08-23 SURGICAL SUPPLY — 11 items
CABLE SURGICAL S-101-97-12 (CABLE) ×1 IMPLANT
CATH RIGHTSITE C315HIS02 (CATHETERS) IMPLANT
IPG PACE AZUR XT DR MRI W1DR01 (Pacemaker) IMPLANT
LEAD CAPSURE NOVUS 5076-52CM (Lead) IMPLANT
LEAD SELECT SECURE 3830 383069 (Lead) IMPLANT
PAD DEFIB RADIO PHYSIO CONN (PAD) ×1 IMPLANT
SHEATH 7FR PRELUDE SNAP 13 (SHEATH) IMPLANT
SHEATH PROBE COVER 6X72 (BAG) IMPLANT
SLITTER 6232ADJ (MISCELLANEOUS) IMPLANT
TRAY PACEMAKER INSERTION (PACKS) ×1 IMPLANT
WIRE HI TORQ VERSACORE-J 145CM (WIRE) IMPLANT

## 2023-08-23 NOTE — Discharge Instructions (Addendum)
 Resume Eliquis  on the morning of 4/25.  After Your Pacemaker   You have a Medtronic Pacemaker  ACTIVITY Do not lift your arm above shoulder height for 1 week after your procedure. After 7 days, you may progress as below.  You should remove your sling 24 hours after your procedure, unless otherwise instructed by your provider.     Monday August 30, 2023  Tuesday August 31, 2023 Wednesday September 01, 2023 Thursday Sep 02, 2023   Do not lift, push, pull, or carry anything over 10 pounds with the affected arm until 6 weeks (Monday October 04, 2023 ) after your procedure.   You may drive AFTER your wound check, unless you have been told otherwise by your provider.   Ask your healthcare provider when you can go back to work   INCISION/Dressing If you are on a blood thinner such as Coumadin, Xarelto, Eliquis , Plavix, or Pradaxa please confirm with your provider when this should be resumed.   If large square, outer bandage is left in place, this can be removed after 24 hours from your procedure. Do not remove steri-strips or glue as below.   If a PRESSURE DRESSING (a bulky dressing that usually goes up over your shoulder) was applied or left in place, please follow instructions given by your provider on when to return to have this removed.   Monitor your Pacemaker site for redness, swelling, and drainage. Call the device clinic at [phone] if you experience these symptoms or fever/chills.  If your incision is sealed with Steri-strips or staples, you may shower 7 days after your procedure or when told by your provider. Do not remove the steri-strips or let the shower hit directly on your site. You may wash around your site with soap and water .    If you were discharged in a sling, please do not wear this during the day more than 48 hours after your surgery unless otherwise instructed. This may increase the risk of stiffness and soreness in your shoulder.   Avoid lotions, ointments, or perfumes  over your incision until it is well-healed.  You may use a hot tub or a pool AFTER your wound check appointment if the incision is completely closed.  Pacemaker Alerts:  Some alerts are vibratory and others beep. These are NOT emergencies. Please call our office to let us  know. If this occurs at night or on weekends, it can wait until the next business day. Send a remote transmission.  If your device is capable of reading fluid status (for heart failure), you will be offered monthly monitoring to review this with you.   DEVICE MANAGEMENT Remote monitoring is used to monitor your pacemaker from home. This monitoring is scheduled every 91 days by our office. It allows us  to keep an eye on the functioning of your device to ensure it is working properly. You will routinely see your Electrophysiologist annually (more often if necessary).   You should receive your ID card for your new device in 4-8 weeks. Keep this card with you at all times once received. Consider wearing a medical alert bracelet or necklace.  Your Pacemaker may be MRI compatible. This will be discussed at your next office visit/wound check.  You should avoid contact with strong electric or magnetic fields.   Do not use amateur (ham) radio equipment or electric (arc) welding torches. MP3 player headphones with magnets should not be used. Some devices are safe to use if held at least 12 inches (30 cm)  from your Pacemaker. These include power tools, lawn mowers, and speakers. If you are unsure if something is safe to use, ask your health care provider.  When using your cell phone, hold it to the ear that is on the opposite side from the Pacemaker. Do not leave your cell phone in a pocket over the Pacemaker.  You may safely use electric blankets, heating pads, computers, and microwave ovens.  Call the office right away if: You have chest pain. You feel more short of breath than you have felt before. You feel more light-headed than  you have felt before. Your incision starts to open up.  This information is not intended to replace advice given to you by your health care provider. Make sure you discuss any questions you have with your health care provider.

## 2023-08-23 NOTE — Interval H&P Note (Signed)
 History and Physical Interval Note:  08/23/2023 3:50 PM  Daniel Howe  has presented today for surgery, with the diagnosis of complete heart block.  The various methods of treatment have been discussed with the patient and family. After consideration of risks, benefits and other options for treatment, the patient has consented to  Procedure(s): PACEMAKER IMPLANT (N/A) as a surgical intervention.  The patient's history has been reviewed, patient examined, no change in status, stable for surgery.  I have reviewed the patient's chart and labs.  Questions were answered to the patient's satisfaction.     Ardeen Kohler

## 2023-08-24 ENCOUNTER — Encounter (HOSPITAL_COMMUNITY): Payer: Self-pay | Admitting: Cardiology

## 2023-09-08 ENCOUNTER — Ambulatory Visit: Attending: Cardiovascular Disease

## 2023-09-08 DIAGNOSIS — I440 Atrioventricular block, first degree: Secondary | ICD-10-CM | POA: Diagnosis not present

## 2023-09-08 NOTE — Patient Instructions (Signed)

## 2023-09-09 LAB — CUP PACEART INCLINIC DEVICE CHECK
Date Time Interrogation Session: 20250507135029
Implantable Lead Connection Status: 753985
Implantable Lead Connection Status: 753985
Implantable Lead Implant Date: 20250421
Implantable Lead Implant Date: 20250421
Implantable Lead Location: 753859
Implantable Lead Location: 753860
Implantable Lead Model: 3830
Implantable Lead Model: 5076
Implantable Pulse Generator Implant Date: 20250421

## 2023-09-09 NOTE — Progress Notes (Signed)
 Normal dual chamber pacemaker wound check. Presenting rhythm: AS/VP. Wound well healed. Routine testing performed. Thresholds, sensing, and impedances consistent with implant measurements and at 3.5V safety margin/auto capture until 3 month visit. No episodes. Reviewed arm restrictions to continue for 6 weeks total post op.  Pt enrolled in remote follow-up.

## 2023-11-15 NOTE — Progress Notes (Addendum)
  Electrophysiology Office Note:   Date:  11/23/2023  ID:  Daniel Howe, DOB December 07, 1940, MRN [id number]  Primary Cardiologist: None Primary Heart Failure: None Electrophysiologist: Fonda Kitty, MD       History of Present Illness:   Daniel Howe is a 83 y.o. male with h/o High Degree AVB s/p PPM, AF, HTN, OSA, CKD seen today for routine electrophysiology follow-up s/p Pacemaker implant.  Since last being seen in our clinic the patient reports doing very well. He has not had any issues with device. Site healed well.     He denies chest pain, palpitations, dyspnea, PND, orthopnea, nausea, vomiting, dizziness, syncope, edema, weight gain, or early satiety.    Review of systems complete and found to be negative unless listed in HPI.    EP Information / Studies Reviewed:    EKG is ordered today. Personal review as below.  EKG Interpretation Date/Time:  Tuesday November 23 2023 14:06:32 EDT Ventricular Rate:  92 PR Interval:  182 QRS Duration:  158 QT Interval:  396 QTC Calculation: 489 R Axis:   85  Text Interpretation: Atrial-sensed ventricular-paced rhythm Confirmed by Aniceto Jarvis ([id number]) on 11/23/2023 2:24:23 PM   PPM Interrogation-  reviewed in detail today,  See PACEART report.  Device History: Medtronic Dual Chamber PPM implanted 08/23/23 for Second Degree AV block / Symptomatic Bradycardia  Dependent at 40 bpm 11/23/23   Risk Assessment/Calculations:    CHA2DS2-VASc Score = 3   This indicates a 3.2% annual risk of stroke. The patient's score is based upon: CHF History: 0 HTN History: 1 Diabetes History: 0 Stroke History: 0 Vascular Disease History: 0 Age Score: 2 Gender Score: 0             Physical Exam:   VS:  BP (!) 139/50   Pulse 92   Ht 6' 2 (1.88 m)   Wt 208 lb 9.6 oz (94.6 kg)   SpO2 98%   BMI 26.78 kg/m    Wt Readings from Last 3 Encounters:  11/23/23 208 lb 9.6 oz (94.6 kg)  08/23/23 212 lb (96.2 kg)  08/17/23 213 lb (96.6 kg)      GEN: pleasant, well nourished, well developed in no acute distress NECK: No JVD; No carotid bruits CARDIAC: Regular rate and rhythm, no murmurs, rubs, gallops, PPM site well healed, no tethering RESPIRATORY:  Clear to auscultation without rales, wheezing or rhonchi  ABDOMEN: Soft, non-tender, non-distended EXTREMITIES:  No edema; No deformity   ASSESSMENT AND PLAN:    Second Degree AV block s/p Medtronic PPM  -Normal PPM function -See Pace Art report -changed to chronic outputs (RA 2V, RV 2.5V) -dependent at 40 bpm   Persistent Atrial Fibrillation  -OAC for stroke prophylaxis  -EKG with NSR    -<0.1% burden on device   Secondary Hypercoagulable State  -continue Eliquis  2.5mg  BID, dose reviewed and appropriate by age / Cr   Hypertension  -well controlled on current regimen    Disposition:   Follow up with Dr. Kitty in 12 months  Signed, Jarvis Aniceto, NP-C, AGACNP-BC [facility] - Electrophysiology  11/23/2023, 5:23 PM

## 2023-11-23 ENCOUNTER — Encounter: Payer: Self-pay | Admitting: Pulmonary Disease

## 2023-11-23 ENCOUNTER — Ambulatory Visit: Attending: Cardiology | Admitting: Pulmonary Disease

## 2023-11-23 VITALS — BP 139/50 | HR 92 | Ht 74.0 in | Wt 208.6 lb

## 2023-11-23 DIAGNOSIS — I441 Atrioventricular block, second degree: Secondary | ICD-10-CM

## 2023-11-23 DIAGNOSIS — I10 Essential (primary) hypertension: Secondary | ICD-10-CM | POA: Diagnosis not present

## 2023-11-23 DIAGNOSIS — I4439 Other atrioventricular block: Secondary | ICD-10-CM | POA: Diagnosis not present

## 2023-11-23 DIAGNOSIS — I440 Atrioventricular block, first degree: Secondary | ICD-10-CM

## 2023-11-23 DIAGNOSIS — I4819 Other persistent atrial fibrillation: Secondary | ICD-10-CM

## 2023-11-23 DIAGNOSIS — Z950 Presence of cardiac pacemaker: Secondary | ICD-10-CM

## 2023-11-23 DIAGNOSIS — I442 Atrioventricular block, complete: Secondary | ICD-10-CM | POA: Diagnosis not present

## 2023-11-23 DIAGNOSIS — R001 Bradycardia, unspecified: Secondary | ICD-10-CM

## 2023-11-23 LAB — CUP PACEART INCLINIC DEVICE CHECK
Date Time Interrogation Session: 20250722172412
Implantable Lead Connection Status: 753985
Implantable Lead Connection Status: 753985
Implantable Lead Implant Date: 20250421
Implantable Lead Implant Date: 20250421
Implantable Lead Location: 753859
Implantable Lead Location: 753860
Implantable Lead Model: 3830
Implantable Lead Model: 5076
Implantable Pulse Generator Implant Date: 20250421

## 2023-11-23 NOTE — Patient Instructions (Signed)
 Medication Instructions:  Your physician recommends that you continue on your current medications as directed. Please refer to the Current Medication list given to you today.  *If you need a refill on your cardiac medications before your next appointment, please call your pharmacy*  Lab Work: None ordered If you have labs (blood work) drawn today and your tests are completely normal, you will receive your results only by: MyChart Message (if you have MyChart) OR A paper copy in the mail If you have any lab test that is abnormal or we need to change your treatment, we will call you to review the results.  Follow-Up: At [facility], you and your health needs are our priority.  As part of our continuing mission to provide you with exceptional heart care, our providers are all part of one team.  This team includes your primary Cardiologist (physician) and Advanced Practice Providers or APPs (Physician Assistants and Nurse Practitioners) who all work together to provide you with the care you need, when you need it.  Your next appointment:   1 year(s)  Provider:   You may see Ardeen Kohler, MD or one of the following Advanced Practice Providers on your designated Care Team:   Mertha Abrahams, PA-C Emmert Andy Tillery, PA-C Suzann Riddle, NP Creighton Doffing, NP

## 2023-11-24 ENCOUNTER — Ambulatory Visit

## 2023-11-24 ENCOUNTER — Ambulatory Visit: Payer: Self-pay | Admitting: Cardiology

## 2023-11-24 DIAGNOSIS — I440 Atrioventricular block, first degree: Secondary | ICD-10-CM | POA: Diagnosis not present

## 2023-11-29 ENCOUNTER — Ambulatory Visit: Payer: Self-pay | Admitting: Cardiology

## 2023-11-29 LAB — CUP PACEART REMOTE DEVICE CHECK
Battery Remaining Longevity: 146 mo
Battery Voltage: 3.19 V
Brady Statistic AP VP Percent: 2.27 %
Brady Statistic AP VS Percent: 0 %
Brady Statistic AS VP Percent: 97.59 %
Brady Statistic AS VS Percent: 0.14 %
Brady Statistic RA Percent Paced: 2.28 %
Brady Statistic RV Percent Paced: 99.86 %
Date Time Interrogation Session: 20250725134143
Implantable Lead Connection Status: 753985
Implantable Lead Connection Status: 753985
Implantable Lead Implant Date: 20250421
Implantable Lead Implant Date: 20250421
Implantable Lead Location: 753859
Implantable Lead Location: 753860
Implantable Lead Model: 3830
Implantable Lead Model: 5076
Implantable Pulse Generator Implant Date: 20250421
Lead Channel Impedance Value: 361 Ohm
Lead Channel Impedance Value: 399 Ohm
Lead Channel Impedance Value: 456 Ohm
Lead Channel Impedance Value: 627 Ohm
Lead Channel Pacing Threshold Amplitude: 0.375 V
Lead Channel Pacing Threshold Amplitude: 0.75 V
Lead Channel Pacing Threshold Pulse Width: 0.4 ms
Lead Channel Pacing Threshold Pulse Width: 0.4 ms
Lead Channel Sensing Intrinsic Amplitude: 10.125 mV
Lead Channel Sensing Intrinsic Amplitude: 10.125 mV
Lead Channel Sensing Intrinsic Amplitude: 5.375 mV
Lead Channel Sensing Intrinsic Amplitude: 5.375 mV
Lead Channel Setting Pacing Amplitude: 1.75 V
Lead Channel Setting Pacing Amplitude: 2.25 V
Lead Channel Setting Pacing Pulse Width: 0.4 ms
Lead Channel Setting Sensing Sensitivity: 2.8 mV
Zone Setting Status: 755011
Zone Setting Status: 755011

## 2024-02-04 NOTE — Progress Notes (Signed)
 Remote PPM Transmission

## 2024-02-23 ENCOUNTER — Ambulatory Visit

## 2024-02-23 DIAGNOSIS — I440 Atrioventricular block, first degree: Secondary | ICD-10-CM | POA: Diagnosis not present

## 2024-02-24 LAB — CUP PACEART REMOTE DEVICE CHECK
Battery Remaining Longevity: 154 mo
Battery Voltage: 3.17 V
Brady Statistic AP VP Percent: 2.49 %
Brady Statistic AP VS Percent: 0 %
Brady Statistic AS VP Percent: 97.42 %
Brady Statistic AS VS Percent: 0.09 %
Brady Statistic RA Percent Paced: 2.49 %
Brady Statistic RV Percent Paced: 99.91 %
Date Time Interrogation Session: 20251021205742
Implantable Lead Connection Status: 753985
Implantable Lead Connection Status: 753985
Implantable Lead Implant Date: 20250421
Implantable Lead Implant Date: 20250421
Implantable Lead Location: 753859
Implantable Lead Location: 753860
Implantable Lead Model: 3830
Implantable Lead Model: 5076
Implantable Pulse Generator Implant Date: 20250421
Lead Channel Impedance Value: 342 Ohm
Lead Channel Impedance Value: 418 Ohm
Lead Channel Impedance Value: 456 Ohm
Lead Channel Impedance Value: 684 Ohm
Lead Channel Pacing Threshold Amplitude: 0.5 V
Lead Channel Pacing Threshold Amplitude: 0.875 V
Lead Channel Pacing Threshold Pulse Width: 0.4 ms
Lead Channel Pacing Threshold Pulse Width: 0.4 ms
Lead Channel Sensing Intrinsic Amplitude: 11.125 mV
Lead Channel Sensing Intrinsic Amplitude: 11.125 mV
Lead Channel Sensing Intrinsic Amplitude: 4 mV
Lead Channel Sensing Intrinsic Amplitude: 4 mV
Lead Channel Setting Pacing Amplitude: 1.5 V
Lead Channel Setting Pacing Amplitude: 2 V
Lead Channel Setting Pacing Pulse Width: 0.4 ms
Lead Channel Setting Sensing Sensitivity: 2.8 mV
Zone Setting Status: 755011
Zone Setting Status: 755011

## 2024-02-25 NOTE — Progress Notes (Signed)
 Remote PPM Transmission

## 2024-02-26 ENCOUNTER — Ambulatory Visit: Payer: Self-pay | Admitting: Cardiology

## 2024-03-15 ENCOUNTER — Telehealth (HOSPITAL_BASED_OUTPATIENT_CLINIC_OR_DEPARTMENT_OTHER): Payer: Self-pay | Admitting: *Deleted

## 2024-03-15 ENCOUNTER — Encounter: Payer: Self-pay | Admitting: Cardiology

## 2024-03-15 ENCOUNTER — Ambulatory Visit: Payer: Self-pay | Admitting: General Surgery

## 2024-03-15 NOTE — H&P (Signed)
 Chief Complaint: Follow-up     History of Present Illness: Daniel Howe is a 83 y.o. male who is seen today as an office consultation at the request of Dr. Arch for evaluation of Follow-up .   History of Present Illness Daniel Howe is an 82 year old male who presents with a recurrent right-sided hernia.  Right inguinal hernia - Recurrent right-sided hernia with a small bulge present for three to four months - Bulge has become more noticeable over time - Occasional discomfort when the area is touched  Surgical history - Previous bilateral inguinal robo and open umbilical hernia  - Last hernia surgery occurred three years ago  Cardiac device placement - Pacemaker placement four to six months ago  Anticoagulation therapy - Currently taking Eliquis   Caregiver responsibilities - Primary caregiver for wife, who is scheduled for upcoming knee replacement surgery - Concerned about timing of his own surgery due to caregiving duties     Review of Systems: A complete review of systems was obtained from the patient.  I have reviewed this information and discussed as appropriate with the patient.  See HPI as well for other ROS.  Review of Systems  Constitutional:  Negative for fever.  HENT:  Negative for congestion.   Eyes:  Negative for blurred vision.  Respiratory:  Negative for cough, shortness of breath and wheezing.   Cardiovascular:  Negative for chest pain and palpitations.  Gastrointestinal:  Negative for heartburn.  Genitourinary:  Negative for dysuria.  Musculoskeletal:  Negative for myalgias.  Skin:  Negative for rash.  Neurological:  Negative for dizziness and headaches.  Psychiatric/Behavioral:  Negative for depression and suicidal ideas.   All other systems reviewed and are negative.     Medical History: Past Medical History: Diagnosis Date  Anxiety   Arthritis   History of cancer   Hyperlipidemia   Hypertension   Sleep apnea    There is no  problem list on file for this patient.   Past Surgical History: Procedure Laterality Date  INGUINAL HERNIA REPAIR Left 03/09/2012  Dr. EMERSON Lunger    Allergies Allergen Reactions  Escitalopram  Diarrhea   Current Outpatient Medications on File Prior to Visit Medication Sig Dispense Refill  atorvastatin  (LIPITOR) 40 MG tablet Take 1 tablet by mouth once daily    cholecalciferol (VITAMIN D3) 2,000 unit capsule Take 1 capsule by mouth once daily    sertraline (ZOLOFT) 50 MG tablet Take 50 mg by mouth once daily    telmisartan  (MICARDIS ) 40 MG tablet Take 40 mg by mouth once daily    No current facility-administered medications on file prior to visit.   Family History Problem Relation Age of Onset  High blood pressure (Hypertension) Sister   Hyperlipidemia (Elevated cholesterol) Sister   Coronary Artery Disease (Blocked arteries around heart) Sister   Breast cancer Sister   Coronary Artery Disease (Blocked arteries around heart) Brother   Diabetes Brother   Hyperlipidemia (Elevated cholesterol) Brother   High blood pressure (Hypertension) Brother     Social History  Tobacco Use Smoking Status Never Smokeless Tobacco Never    Social History  Socioeconomic History  Marital status: Married Tobacco Use  Smoking status: Never  Smokeless tobacco: Never Substance and Sexual Activity  Alcohol use: Yes   Comment: 2 glasses of wine daily  Drug use: Never  Social Drivers of Catering Manager Strain: Low Risk  (10/10/2020)  Received from [facility]  Overall Financial Resource Strain (CARDIA)   Difficulty of  Paying Living Expenses: Not hard at all Housing Stability: Unknown (03/15/2024)  Housing Stability Vital Sign   Homeless in the Last Year: No   Objective:   Vitals:  03/15/24 0941 PainSc: 0-No pain   There is no height or weight on file to calculate BMI. Physical Exam Constitutional:      Appearance: Normal appearance.  HENT:     Head:  Normocephalic and atraumatic.     Nose: Nose normal. No congestion.     Mouth/Throat:     Mouth: Mucous membranes are moist.     Pharynx: Oropharynx is clear.  Eyes:     Pupils: Pupils are equal, round, and reactive to light.  Cardiovascular:     Rate and Rhythm: Normal rate and regular rhythm.     Pulses: Normal pulses.     Heart sounds: Normal heart sounds. No murmur heard.    No friction rub. No gallop.  Pulmonary:     Effort: Pulmonary effort is normal. No respiratory distress.     Breath sounds: Normal breath sounds. No stridor. No wheezing, rhonchi or rales.  Abdominal:     General: Abdomen is flat.     Hernia: A hernia is present. Hernia is present in the right inguinal area. There is no hernia in the left inguinal area.  Musculoskeletal:        General: Normal range of motion.     Cervical back: Normal range of motion.  Skin:    General: Skin is warm and dry.  Neurological:     General: No focal deficit present.     Mental Status: He is alert and oriented to person, place, and time.  Psychiatric:        Mood and Affect: Mood normal.        Thought Content: Thought content normal.       Assessment and Plan: Diagnoses and all orders for this visit:  Unilateral recurrent inguinal hernia without obstruction or gangrene    Daniel Howe is a 83 y.o. male   He will need cards clearance and to be off his eliquis    1.  We will proceed to the OR for a open right inguinal hernia repair with mesh. 2. All risks and benefits were discussed with the patient, to generally include infection, bleeding, damage to surrounding structures, acute and chronic nerve pain, and recurrence. Alternatives were offered and described.  All questions were answered and the patient voiced understanding of the procedure and wishes to proceed at this point.       No follow-ups on file.  Lynda Leos, MD, [facility], [place] [facility]

## 2024-03-15 NOTE — Telephone Encounter (Signed)
   Pre-operative Risk Assessment    Patient Name: Daniel Howe  DOB: 11-Jun-1940 MRN: [id number]   Date of last office visit: 11/23/2023 Date of next office visit: None  Request for Surgical Clearance    Procedure:  Hernia surgery  Date of Surgery:  Clearance TBD                                 Surgeon:  Dr. Lynda Leos Surgeon's Group or Practice Name:  [facility] Phone number:  [phone] Fax number:  [phone]   Type of Clearance Requested:   - Medical  - Pharmacy:  Hold Apixaban  (Eliquis ) Not indicated.   Type of Anesthesia:  General    Additional requests/questions:  Sent to device  Signed, Edsel Grayce Sanders   03/15/2024, 2:51 PM

## 2024-03-15 NOTE — Progress Notes (Signed)
 PERIOPERATIVE PRESCRIPTION FOR IMPLANTED CARDIAC DEVICE PROGRAMMING   Patient Information:  Patient: Daniel Howe  MRN: [id number]  Date of Birth: [birth year]   Procedure:  Hernia surgery Date of Surgery:  Clearance TBD                              Surgeon:  Dr. Lynda Leos Surgeon's Group or Practice Name:  [facility] Phone number:  [phone] Fax number:  [phone]     Device Information:   Clinic EP Physician:   Dr. Fonda Kitty Device Type:  Pacemaker Manufacturer and Phone #:  [employer]tr[employer]: [id number] Pacemaker Dependent?:  Yes Date of Last Device Check:  02/22/2024         Normal Device Function?:  Yes     Electrophysiologist's Recommendations:   Have magnet available. Provide continuous ECG monitoring when magnet is used or reprogramming is to be performed.  Procedure will likely interfere with device function.  Device should be programmed:  Asynchronous pacing during procedure and returned to normal programming after procedure  Per Device Clinic Standing Orders, Daniel Howe  03/15/2024 4:02 PM

## 2024-03-21 ENCOUNTER — Telehealth: Payer: Self-pay

## 2024-03-21 NOTE — Telephone Encounter (Signed)
 Patient has been scheduled for TELEVISIT

## 2024-03-21 NOTE — Telephone Encounter (Signed)
 Patient has been scheduled for TELEVISIT med rec and consent done     Patient Consent for Virtual Visit         Daniel Howe has provided verbal consent on 03/21/2024 for a virtual visit (video or telephone).   CONSENT FOR VIRTUAL VISIT FOR:  Daniel Howe  By participating in this virtual visit I agree to the following:  I hereby voluntarily request, consent and authorize [facility] and its employed or contracted physicians, physician assistants, nurse practitioners or other licensed health care professionals (the Practitioner), to provide me with telemedicine health care services (the "Services) as deemed necessary by the treating Practitioner. I acknowledge and consent to receive the Services by the Practitioner via telemedicine. I understand that the telemedicine visit will involve communicating with the Practitioner through live audiovisual communication technology and the disclosure of certain medical information by electronic transmission. I acknowledge that I have been given the opportunity to request an in-person assessment or other available alternative prior to the telemedicine visit and am voluntarily participating in the telemedicine visit.  I understand that I have the right to withhold or withdraw my consent to the use of telemedicine in the course of my care at any time, without affecting my right to future care or treatment, and that the Practitioner or I may terminate the telemedicine visit at any time. I understand that I have the right to inspect all information obtained and/or recorded in the course of the telemedicine visit and may receive copies of available information for a reasonable fee.  I understand that some of the potential risks of receiving the Services via telemedicine include:  Delay or interruption in medical evaluation due to technological equipment failure or disruption; Information transmitted may not be sufficient (e.g. poor resolution of  images) to allow for appropriate medical decision making by the Practitioner; and/or  In rare instances, security protocols could fail, causing a breach of personal health information.  Furthermore, I acknowledge that it is my responsibility to provide information about my medical history, conditions and care that is complete and accurate to the best of my ability. I acknowledge that Practitioner's advice, recommendations, and/or decision may be based on factors not within their control, such as incomplete or inaccurate data provided by me or distortions of diagnostic images or specimens that may result from electronic transmissions. I understand that the practice of medicine is not an exact science and that Practitioner makes no warranties or guarantees regarding treatment outcomes. I acknowledge that a copy of this consent can be made available to me via my patient portal [facility] MyChart), or I can request a printed copy by calling the office of [facility].    I understand that my insurance will be billed for this visit.   I have read or had this consent read to me. I understand the contents of this consent, which adequately explains the benefits and risks of the Services being provided via telemedicine.  I have been provided ample opportunity to ask questions regarding this consent and the Services and have had my questions answered to my satisfaction. I give my informed consent for the services to be provided through the use of telemedicine in my medical care

## 2024-03-21 NOTE — Telephone Encounter (Signed)
 Patient with diagnosis of Afib on Eliquis  for anticoagulation.    Procedure: Hernia surgery  Date of procedure: TBD   CHA2DS2-VASc Score = 3   This indicates a 3.2% annual risk of stroke. The patient's score is based upon: CHF History: 0 HTN History: 1 Diabetes History: 0 Stroke History: 0 Vascular Disease History: 0 Age Score: 2 Gender Score: 0     CrCl 40 mL/min (08/2023) Platelet count 216 K  Patient has not had an Afib/aflutter ablation in the last 3 months, DCCV within the last 4 weeks or a watchman implanted in the last 45 days    Per office protocol, patient can hold Eliquis  for 2 days prior to procedure.   Patient will not need bridging with Lovenox (enoxaparin) around procedure.  **This guidance is not considered finalized until pre-operative APP has relayed final recommendations.**

## 2024-03-21 NOTE — Telephone Encounter (Signed)
   Name: Daniel Howe  DOB: 1940-10-22  MRN: [id number]  Primary Cardiologist: None   Preoperative team, please contact this patient and set up a phone call appointment for further preoperative risk assessment. Please obtain consent and complete medication review. Thank you for your help.  I confirm that guidance regarding antiplatelet and oral anticoagulation therapy has been completed and, if necessary, noted below.  Per office protocol, patient can hold Eliquis  for 2 days prior to procedure.   I also confirmed the patient resides in the state of [place] . As per [facility] Medical Board telemedicine laws, the patient must reside in the state in which the provider is licensed.   Jon Nat Hails, PA 03/21/2024, 11:55 AM [facility]

## 2024-03-24 ENCOUNTER — Ambulatory Visit: Attending: Internal Medicine | Admitting: Physician Assistant

## 2024-03-24 DIAGNOSIS — Z01810 Encounter for preprocedural cardiovascular examination: Secondary | ICD-10-CM | POA: Diagnosis not present

## 2024-03-24 NOTE — Progress Notes (Signed)
 Virtual Visit via Telephone Note   Because of [name] co-morbid illnesses, he is at least at moderate risk for complications without adequate follow up.  This format is felt to be most appropriate for this patient at this time.  Due to technical limitations with video connection web designer), today's appointment will be conducted as an audio only telehealth visit, and [name] verbally agreed to proceed in this manner.   All issues noted in this document were discussed and addressed.  No physical exam could be performed with this format.  Evaluation Performed:  Preoperative cardiovascular risk assessment _____________   Date:  03/24/2024   Patient ID:  Daniel Howe, DOB Oct 01, 1940, MRN [id number] Patient Location:  Home Provider location:   Office  Primary Care Provider:  Valentin Skates, DO Primary Cardiologist:  None  Chief Complaint / Patient Profile   83 y.o. y/o male with a h/o high degree AVB status post PPM, AF, HTN, OSA, CKD who is pending hernia repair surgery and presents today for telephonic preoperative cardiovascular risk assessment.  History of Present Illness    Daniel Howe is a 83 y.o. male who presents via audio/video conferencing for a telehealth visit today.  Pt was last seen in cardiology clinic on 11/23/23 by Daphne Barrack, NP.  At that time [name] was doing well.  The patient is now pending procedure as outlined above. Since his last visit, he states that he has had no cardiovascular symptoms.  No shortness of breath, chest pain, or lower extremity edema. He usually walks a mile in about 20-22 minutes most days of the week. He is able to do a lot of grocery shopping.  He does meet minimum METS for clearance.  Per office protocol, patient can hold Eliquis  for 2 days prior to procedure.  He can resume his Eliquis  when medically safe to do so. Patient will not need bridging with Lovenox (enoxaparin) around procedure   Past Medical  History    Past Medical History:  Diagnosis Date   Anxiety    Arthritis    Asthma    In childhood   Chronic kidney disease    Renal insufficiency   Depression    Dysrhythmia    hx of bradycardia, pauses shown on holter monitor from 2021   Hx of radiation therapy    Hypertension    Pneumonia    Several times as a child   Prostate cancer [facility])    Sleep apnea    Past Surgical History:  Procedure Laterality Date   CARDIOVERSION N/A 07/23/2023   Procedure: CARDIOVERSION;  Surgeon: Loni Soyla LABOR, MD;  Location: [facility] INVASIVE CV LAB;  Service: Cardiovascular;  Laterality: N/A;   CATARACT EXTRACTION     CORNEAL TRANSPLANT     HERNIA REPAIR  03/09/12   lih repair   INGUINAL HERNIA REPAIR  03/09/2012   Procedure: HERNIA REPAIR INGUINAL ADULT;  Surgeon: Donnice KATHEE Lunger, MD;  Location: [facility] ORS;  Service: General;  Laterality: Left;   INSERTION OF MESH Bilateral 09/23/2021   Procedure: INSERTION OF MESH;  Surgeon: Rubin Calamity, MD;  Location: [facility] OR;  Service: General;  Laterality: Bilateral;   PACEMAKER IMPLANT N/A 08/23/2023   Procedure: PACEMAKER IMPLANT;  Surgeon: Kennyth Chew, MD;  Location: [facility] INVASIVE CV LAB;  Service: Cardiovascular;  Laterality: N/A;   UMBILICAL HERNIA REPAIR  09/23/2021   Procedure: OPEN UMBILICAL HERNIA REPAIR WITH MESH;  Surgeon: Rubin Calamity, MD;  Location: [facility] OR;  Service:  General;;    Allergies  Allergies  Allergen Reactions   Lexapro  [Escitalopram ] Diarrhea    Home Medications    Prior to Admission medications   Medication Sig Start Date End Date Taking? Authorizing Provider  apixaban  (ELIQUIS ) 2.5 MG TABS tablet Take 1 tablet (2.5 mg total) by mouth 2 (two) times daily. 08/27/23   Kennyth Chew, MD  atorvastatin  (LIPITOR) 40 MG tablet TAKE 1 TABLET ONCE DAILY. 10/08/20   Joshua Debby CROME, MD  Cholecalciferol (VITAMIN D) 50 MCG (2000 UT) CAPS Take 2,000 Units by mouth daily.    [provider]  hydrochlorothiazide  (MICROZIDE ) 12.5 MG  capsule Take 1 capsule (12.5 mg total) by mouth daily. 08/18/23   Fernande Elspeth BROCKS, MD  olmesartan (BENICAR) 40 MG tablet Take 40 mg by mouth daily.    [provider]  sertraline (ZOLOFT) 100 MG tablet Take 50 mg by mouth daily.    [provider]    Physical Exam    Vital Signs:  Daniel Howe does not have vital signs available for review today.  Given telephonic nature of communication, physical exam is limited. AAOx3. NAD. Normal affect.  Speech and respirations are unlabored.  Accessory Clinical Findings    None  Assessment & Plan    1.  Preoperative Cardiovascular Risk Assessment:  Mr. Stroebel perioperative risk of a major cardiac event is 0.4% according to the Revised Cardiac Risk Index (RCRI).  Therefore, he is at low risk for perioperative complications.   His functional capacity is good at 5.07 METs according to the Duke Activity Status Index (DASI). Recommendations: According to ACC/AHA guidelines, no further cardiovascular testing needed.  The patient may proceed to surgery at acceptable risk.   Antiplatelet and/or Anticoagulation Recommendations:  Eliquis  (Apixaban ) can be held for 2 days prior to surgery.  Please resume post op when felt to be safe.     The patient was advised that if he develops new symptoms prior to surgery to contact our office to arrange for a follow-up visit, and he verbalized understanding.   A copy of this note will be routed to requesting surgeon.  Time:   Today, I have spent 5 minutes with the patient with telehealth technology discussing medical history, symptoms, and management plan.     [name], PA-C  03/24/2024, 10:40 AM

## 2024-05-24 ENCOUNTER — Ambulatory Visit

## 2024-05-24 DIAGNOSIS — I440 Atrioventricular block, first degree: Secondary | ICD-10-CM

## 2024-05-26 LAB — CUP PACEART REMOTE DEVICE CHECK
Battery Remaining Longevity: 141 mo
Battery Voltage: 3.13 V
Brady Statistic AP VP Percent: 4.84 %
Brady Statistic AP VS Percent: 0 %
Brady Statistic AS VP Percent: 95.1 %
Brady Statistic AS VS Percent: 0.06 %
Brady Statistic RA Percent Paced: 4.84 %
Brady Statistic RV Percent Paced: 99.94 %
Date Time Interrogation Session: 20260122113350
Implantable Lead Connection Status: 753985
Implantable Lead Connection Status: 753985
Implantable Lead Implant Date: 20250421
Implantable Lead Implant Date: 20250421
Implantable Lead Location: 753859
Implantable Lead Location: 753860
Implantable Lead Model: 3830
Implantable Lead Model: 5076
Implantable Pulse Generator Implant Date: 20250421
Lead Channel Impedance Value: 342 Ohm
Lead Channel Impedance Value: 399 Ohm
Lead Channel Impedance Value: 513 Ohm
Lead Channel Impedance Value: 646 Ohm
Lead Channel Pacing Threshold Amplitude: 0.5 V
Lead Channel Pacing Threshold Amplitude: 1.125 V
Lead Channel Pacing Threshold Pulse Width: 0.4 ms
Lead Channel Pacing Threshold Pulse Width: 0.4 ms
Lead Channel Sensing Intrinsic Amplitude: 12.375 mV
Lead Channel Sensing Intrinsic Amplitude: 12.375 mV
Lead Channel Sensing Intrinsic Amplitude: 5.125 mV
Lead Channel Sensing Intrinsic Amplitude: 5.125 mV
Lead Channel Setting Pacing Amplitude: 1.5 V
Lead Channel Setting Pacing Amplitude: 2.25 V
Lead Channel Setting Pacing Pulse Width: 0.4 ms
Lead Channel Setting Sensing Sensitivity: 2.8 mV
Zone Setting Status: 755011
Zone Setting Status: 755011

## 2024-05-27 NOTE — Progress Notes (Signed)
 Remote PPM Transmission

## 2024-05-29 ENCOUNTER — Ambulatory Visit: Payer: Self-pay | Admitting: Cardiology

## 2024-07-10 ENCOUNTER — Ambulatory Visit (HOSPITAL_COMMUNITY): Admit: 2024-07-10 | Admitting: General Surgery

## 2024-07-10 SURGERY — REPAIR, HERNIA, INGUINAL, ADULT
Anesthesia: General | Laterality: Right

## 2024-08-23 ENCOUNTER — Encounter

## 2024-11-22 ENCOUNTER — Encounter

## 2025-02-21 ENCOUNTER — Encounter
